# Patient Record
Sex: Female | Born: 1990 | Race: Black or African American | Hispanic: No | Marital: Single | Smoking: Never smoker
Health system: Southern US, Community
[De-identification: ages and names within clinical notes are randomized; demographics above are authoritative.]

## PROBLEM LIST (undated history)

## (undated) ENCOUNTER — Inpatient Hospital Stay (HOSPITAL_COMMUNITY): Payer: Self-pay

## (undated) DIAGNOSIS — Z789 Other specified health status: Secondary | ICD-10-CM

## (undated) DIAGNOSIS — N83209 Unspecified ovarian cyst, unspecified side: Secondary | ICD-10-CM

## (undated) DIAGNOSIS — Z973 Presence of spectacles and contact lenses: Secondary | ICD-10-CM

## (undated) HISTORY — DX: Other specified health status: Z78.9

---
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
## 2011-04-21 NOTE — L&D Delivery Note (Signed)
I was present for delivery and agree with above  

## 2011-04-21 NOTE — L&D Delivery Note (Signed)
Delivery Note At 9:50 PM a viable female was delivered via Vaginal, Spontaneous Delivery (Presentation: ; Transverse Occiput Left).  APGAR: 7, 9; weight .   Placenta status: Intact, Spontaneous.  Cord: 3 vessels with the following complications: None.    Anesthesia: Epidural  Episiotomy: None Lacerations: 2nd degree;Perineal Suture Repair: 3.0 vicryl Est. Blood Loss (mL): 350  Mom to postpartum.  Baby to nursery-stable.  BOOTH, Amen Staszak 01/14/2012, 10:24 PM

## 2011-08-18 ENCOUNTER — Ambulatory Visit: Payer: Medicaid Other | Admitting: Family Medicine

## 2011-08-24 ENCOUNTER — Ambulatory Visit (INDEPENDENT_AMBULATORY_CARE_PROVIDER_SITE_OTHER): Payer: Self-pay | Admitting: Family Medicine

## 2011-08-24 ENCOUNTER — Telehealth: Payer: Self-pay | Admitting: Family Medicine

## 2011-08-24 ENCOUNTER — Encounter: Payer: Self-pay | Admitting: Family Medicine

## 2011-08-24 VITALS — BP 112/62 | HR 92 | Temp 98.6°F | Ht 64.25 in | Wt 137.3 lb

## 2011-08-24 DIAGNOSIS — Z3400 Encounter for supervision of normal first pregnancy, unspecified trimester: Secondary | ICD-10-CM

## 2011-08-24 DIAGNOSIS — N912 Amenorrhea, unspecified: Secondary | ICD-10-CM

## 2011-08-24 DIAGNOSIS — Z3201 Encounter for pregnancy test, result positive: Secondary | ICD-10-CM

## 2011-08-24 DIAGNOSIS — Z331 Pregnant state, incidental: Secondary | ICD-10-CM

## 2011-08-24 LAB — POCT URINE PREGNANCY: Preg Test, Ur: POSITIVE

## 2011-08-24 NOTE — Progress Notes (Signed)
  Subjective:    Patient ID: Cassandra [name], female    DOB: 04-08-1991, 20 y.o.   MRN: [id number]  HPI 82 yo here with boyfriend for pregnancy test, has had positive test at home.  Boyfriend very excited about pregnancy, pt hesitant before saying this is a good thing.  Pregnancy unplanned.  Pt is a G1P0 at 16 and 3/7 by sure LMP (no horomonal contraception, regular cycles) with and EDD of 01/15/12.  She has had no prenatal care and is not taking prenatal vitamins.  She does not smoke, drink ETOH or use illegal drugs.  She takes no medication.  Pt has felt fetal movement, denies ctx, VB or LOF.      Review of Systems  See HPI     Objective:   Physical Exam  Nursing note and vitals reviewed. Constitutional: She appears well-developed and well-nourished. No distress.  Abdominal:       Gravid uterus.   FHT:  150s  Skin: She is not diaphoretic.  Psychiatric: She has a normal mood and affect. Her behavior is normal. Judgment and thought content normal.          Assessment & Plan:

## 2011-08-24 NOTE — Assessment & Plan Note (Addendum)
G1P0 at 19.3 by sure LMP with unplanned pregnancy.  Here with BF, who is excited about the pregnancy, but unclear if pt is excited.   Pt to apply for Medicaid today.  Offered prenatal labs, but would have to pay out of pocket, so she prefers to apply for medicaid. She will contact us when she has applied.    Will need early 1 hour for +FH diabetes (mother), can do with prenatal panel.   Will need ultrasound as soon as possible.   Discussed genetic screening and [facility]. Boyfriend wants genetic screening.  Pt ok with [facility] calling. I will ask them to contact the pt.   Will need prenatal panel and new OB visit.   Follow up as soon as possible. Start prenatal vitamin.

## 2011-08-24 NOTE — Patient Instructions (Signed)
Congratulations.  You are pregnant, and it looks like you are 19 weeks and 3 days along.  This would put your due date around 01/15/12.  It is normal to have lots of different feelings about a pregnancy. Please let us know as soon as you apply for Medicaid.  We will need to schedule an ultrasound and appointments and check some blood work, but you will get a huge bill for these services if you have not applied for Medicaid. Please let us know if you have any vaginal bleeding, unusual discharge, abdominal pain, or any other concerns.

## 2011-08-24 NOTE — Telephone Encounter (Signed)
Left message with [facility] that I would like to refer a patient.  Did not leave pt info as I am unsure if VM was confidential.

## 2011-08-27 ENCOUNTER — Telehealth: Payer: Self-pay | Admitting: Family Medicine

## 2011-08-27 NOTE — Telephone Encounter (Signed)
Spoke with Hannah Beat and made referral to Nurse [facility].  She will contact the pt.

## 2011-09-02 ENCOUNTER — Other Ambulatory Visit: Payer: Self-pay

## 2011-09-02 DIAGNOSIS — Z3400 Encounter for supervision of normal first pregnancy, unspecified trimester: Secondary | ICD-10-CM

## 2011-09-02 LAB — HIV ANTIBODY (ROUTINE TESTING W REFLEX): HIV: NONREACTIVE

## 2011-09-02 NOTE — Progress Notes (Signed)
PRENATAL LABS DONE TODAY PER DR.JORDAN Doran Nestle

## 2011-09-03 LAB — OBSTETRIC PANEL
Antibody Screen: NEGATIVE
Hemoglobin: 10.1 g/dL — ABNORMAL LOW (ref 12.0–15.0)
Hepatitis B Surface Ag: NEGATIVE
Lymphocytes Relative: 30 % (ref 12–46)
MCV: 90.2 fL (ref 78.0–100.0)
Neutro Abs: 3.8 10*3/uL (ref 1.7–7.7)
Platelets: 312 10*3/uL (ref 150–400)
RBC: 3.47 MIL/uL — ABNORMAL LOW (ref 3.87–5.11)
Rh Type: POSITIVE
Rubella: 59.5 IU/mL — ABNORMAL HIGH
WBC: 6.2 10*3/uL (ref 4.0–10.5)

## 2011-09-04 LAB — CULTURE, OB URINE: Colony Count: 50000

## 2011-09-17 ENCOUNTER — Other Ambulatory Visit (HOSPITAL_COMMUNITY)
Admission: RE | Admit: 2011-09-17 | Discharge: 2011-09-17 | Disposition: A | Discharge status: Home / Self Care | Payer: Medicaid Other | Source: Ambulatory Visit | Attending: Family Medicine | Admitting: Family Medicine

## 2011-09-17 ENCOUNTER — Encounter: Payer: Self-pay | Admitting: Emergency Medicine

## 2011-09-17 ENCOUNTER — Ambulatory Visit: Payer: Self-pay | Admitting: Emergency Medicine

## 2011-09-17 VITALS — BP 110/66 | Wt 138.0 lb

## 2011-09-17 DIAGNOSIS — Z3400 Encounter for supervision of normal first pregnancy, unspecified trimester: Secondary | ICD-10-CM

## 2011-09-17 DIAGNOSIS — Z331 Pregnant state, incidental: Secondary | ICD-10-CM

## 2011-09-17 DIAGNOSIS — O99019 Anemia complicating pregnancy, unspecified trimester: Secondary | ICD-10-CM | POA: Insufficient documentation

## 2011-09-17 DIAGNOSIS — Z113 Encounter for screening for infections with a predominantly sexual mode of transmission: Secondary | ICD-10-CM | POA: Insufficient documentation

## 2011-09-17 LAB — GLUCOSE, CAPILLARY
Comment 1: 1
Glucose-Capillary: 100 mg/dL — ABNORMAL HIGH (ref 70–99)

## 2011-09-17 MED ORDER — FERROUS SULFATE 325 (65 FE) MG PO TABS: 325.0000 mg | ORAL_TABLET | Freq: Every day | ORAL | Status: DC

## 2011-09-17 NOTE — Patient Instructions (Signed)
It was nice to meet you!  Please continue taking your chewable vitamin.  I have also sent a prescription for iron pills to your pharmacy.  Take one a day with breakfast.  We will schedule an ultrasound for you.  This will be reimbursed by medicaid when if comes through.  Please make an appointment in [facility] in 4 weeks.  Pregnancy - Second Trimester The second trimester of pregnancy (3 to 6 months) is a period of rapid growth for you and your baby. At the end of the sixth month, your baby is about 9 inches long and weighs 1 1/2 pounds. You will begin to feel the baby move between 18 and 20 weeks of the pregnancy. This is called quickening. Weight gain is faster. A clear fluid (colostrum) may leak out of your breasts. You may feel small contractions of the womb (uterus). This is known as false labor or Braxton-Hicks contractions. This is like a practice for labor when the baby is ready to be born. Usually, the problems with morning sickness have usually passed by the end of your first trimester. Some women develop small dark blotches (called cholasma, mask of pregnancy) on their face that usually goes away after the baby is born. Exposure to the sun makes the blotches worse. Acne may also develop in some pregnant women and pregnant women who have acne, may find that it goes away. PRENATAL EXAMS  Blood work may continue to be done during prenatal exams. These tests are done to check on your health and the probable health of your baby. Blood work is used to follow your blood levels (hemoglobin). Anemia (low hemoglobin) is common during pregnancy. Iron and vitamins are given to help prevent this. You will also be checked for diabetes between 24 and 28 weeks of the pregnancy. Some of the previous blood tests may be repeated.   The size of the uterus is measured during each visit. This is to make sure that the baby is continuing to grow properly according to the dates of the pregnancy.   Your blood  pressure is checked every prenatal visit. This is to make sure you are not getting toxemia.   Your urine is checked to make sure you do not have an infection, diabetes or protein in the urine.   Your weight is checked often to make sure gains are happening at the suggested rate. This is to ensure that both you and your baby are growing normally.   Sometimes, an ultrasound is performed to confirm the proper growth and development of the baby. This is a test which bounces harmless sound waves off the baby so your caregiver can more accurately determine due dates.  Sometimes, a specialized test is done on the amniotic fluid surrounding the baby. This test is called an amniocentesis. The amniotic fluid is obtained by sticking a needle into the belly (abdomen). This is done to check the chromosomes in instances where there is a concern about possible genetic problems with the baby. It is also sometimes done near the end of pregnancy if an early delivery is required. In this case, it is done to help make sure the baby's lungs are mature enough for the baby to live outside of the womb. CHANGES OCCURING IN THE SECOND TRIMESTER OF PREGNANCY Your body goes through many changes during pregnancy. They vary from person to person. Talk to your caregiver about changes you notice that you are concerned about.  During the second trimester, you will likely  have an increase in your appetite. It is normal to have cravings for certain foods. This varies from person to person and pregnancy to pregnancy.   Your lower abdomen will begin to bulge.   You may have to urinate more often because the uterus and baby are pressing on your bladder. It is also common to get more bladder infections during pregnancy (pain with urination). You can help this by drinking lots of fluids and emptying your bladder before and after intercourse.   You may begin to get stretch marks on your hips, abdomen, and breasts. These are normal changes  in the body during pregnancy. There are no exercises or medications to take that prevent this change.   You may begin to develop swollen and bulging veins (varicose veins) in your legs. Wearing support hose, elevating your feet for 15 minutes, 3 to 4 times a day and limiting salt in your diet helps lessen the problem.   Heartburn may develop as the uterus grows and pushes up against the stomach. Antacids recommended by your caregiver helps with this problem. Also, eating smaller meals 4 to 5 times a day helps.   Constipation can be treated with a stool softener or adding bulk to your diet. Drinking lots of fluids, vegetables, fruits, and whole grains are helpful.   Exercising is also helpful. If you have been very active up until your pregnancy, most of these activities can be continued during your pregnancy. If you have been less active, it is helpful to start an exercise program such as walking.   Hemorrhoids (varicose veins in the rectum) may develop at the end of the second trimester. Warm sitz baths and hemorrhoid cream recommended by your caregiver helps hemorrhoid problems.   Backaches may develop during this time of your pregnancy. Avoid heavy lifting, wear low heal shoes and practice good posture to help with backache problems.   Some pregnant women develop tingling and numbness of their hand and fingers because of swelling and tightening of ligaments in the wrist (carpel tunnel syndrome). This goes away after the baby is born.   As your breasts enlarge, you may have to get a bigger bra. Get a comfortable, cotton, support bra. Do not get a nursing bra until the last month of the pregnancy if you will be nursing the baby.   You may get a dark line from your belly button to the pubic area called the linea nigra.   You may develop rosy cheeks because of increase blood flow to the face.   You may develop spider looking lines of the face, neck, arms and chest. These go away after the baby  is born.  HOME CARE INSTRUCTIONS   It is extremely important to avoid all smoking, herbs, alcohol, and unprescribed drugs during your pregnancy. These chemicals affect the formation and growth of the baby. Avoid these chemicals throughout the pregnancy to ensure the delivery of a healthy infant.   Most of your home care instructions are the same as suggested for the first trimester of your pregnancy. Keep your caregiver's appointments. Follow your caregiver's instructions regarding medication use, exercise and diet.   During pregnancy, you are providing food for you and your baby. Continue to eat regular, well-balanced meals. Choose foods such as meat, fish, milk and other low fat dairy products, vegetables, fruits, and whole-grain breads and cereals. Your caregiver will tell you of the ideal weight gain.   A physical sexual relationship may be continued up until near the  end of pregnancy if there are no other problems. Problems could include early (premature) leaking of amniotic fluid from the membranes, vaginal bleeding, abdominal pain, or other medical or pregnancy problems.   Exercise regularly if there are no restrictions. Check with your caregiver if you are unsure of the safety of some of your exercises. The greatest weight gain will occur in the last 2 trimesters of pregnancy. Exercise will help you:   Control your weight.   Get you in shape for labor and delivery.   Lose weight after you have the baby.   Wear a good support or jogging bra for breast tenderness during pregnancy. This may help if worn during sleep. Pads or tissues may be used in the bra if you are leaking colostrum.   Do not use hot tubs, steam rooms or saunas throughout the pregnancy.   Wear your seat belt at all times when driving. This protects you and your baby if you are in an accident.   Avoid raw meat, uncooked cheese, cat litter boxes and soil used by cats. These carry germs that can cause birth defects in the  baby.   The second trimester is also a good time to visit your dentist for your dental health if this has not been done yet. Getting your teeth cleaned is OK. Use a soft toothbrush. Brush gently during pregnancy.   It is easier to loose urine during pregnancy. Tightening up and strengthening the pelvic muscles will help with this problem. Practice stopping your urination while you are going to the bathroom. These are the same muscles you need to strengthen. It is also the muscles you would use as if you were trying to stop from passing gas. You can practice tightening these muscles up 10 times a set and repeating this about 3 times per day. Once you know what muscles to tighten up, do not perform these exercises during urination. It is more likely to contribute to an infection by backing up the urine.   Ask for help if you have financial, counseling or nutritional needs during pregnancy. Your caregiver will be able to offer counseling for these needs as well as refer you for other special needs.   Your skin may become oily. If so, wash your face with mild soap, use non-greasy moisturizer and oil or cream based makeup.  MEDICATIONS AND DRUG USE IN PREGNANCY  Take prenatal vitamins as directed. The vitamin should contain 1 milligram of folic acid. Keep all vitamins out of reach of children. Only a couple vitamins or tablets containing iron may be fatal to a baby or young child when ingested.   Avoid use of all medications, including herbs, over-the-counter medications, not prescribed or suggested by your caregiver. Only take over-the-counter or prescription medicines for pain, discomfort, or fever as directed by your caregiver. Do not use aspirin.   Let your caregiver also know about herbs you may be using.   Alcohol is related to a number of birth defects. This includes fetal alcohol syndrome. All alcohol, in any form, should be avoided completely. Smoking will cause low birth rate and premature  babies.   Street or illegal drugs are very harmful to the baby. They are absolutely forbidden. A baby born to an addicted mother will be addicted at birth. The baby will go through the same withdrawal an adult does.  SEEK MEDICAL CARE IF:  You have any concerns or worries during your pregnancy. It is better to call with your questions if  you feel they cannot wait, rather than worry about them. SEEK IMMEDIATE MEDICAL CARE IF:   An unexplained oral temperature above 102 F (38.9 C) develops, or as your caregiver suggests.   You have leaking of fluid from the vagina (birth canal). If leaking membranes are suspected, take your temperature and tell your caregiver of this when you call.   There is vaginal spotting, bleeding, or passing clots. Tell your caregiver of the amount and how many pads are used. Light spotting in pregnancy is common, especially following intercourse.   You develop a bad smelling vaginal discharge with a change in the color from clear to white.   You continue to feel sick to your stomach (nauseated) and have no relief from remedies suggested. You vomit blood or coffee ground-like materials.   You lose more than 2 pounds of weight or gain more than 2 pounds of weight over 1 week, or as suggested by your caregiver.   You notice swelling of your face, hands, feet, or legs.   You get exposed to [place] measles and have never had them.   You are exposed to fifth disease or chickenpox.   You develop belly (abdominal) pain. Round ligament discomfort is a common non-cancerous (benign) cause of abdominal pain in pregnancy. Your caregiver still must evaluate you.   You develop a bad headache that does not go away.   You develop fever, diarrhea, pain with urination, or shortness of breath.   You develop visual problems, blurry, or double vision.   You fall or are in a car accident or any kind of trauma.   There is mental or physical violence at home.  Document Released:  03/31/2001 Document Revised: 03/26/2011 Document Reviewed: 10/03/2008 [facility] Patient Information 2012 [place], [place].

## 2011-09-17 NOTE — Progress Notes (Signed)
S: 21 year old G1 at [redacted]w[redacted]d by sure LMP (EDD 01/15/2012) presenting for initial OB visit.  No acute concerns.  Did have some nausea and vomiting earlier in pregnancy, but none currently.  Reviewed medical, family, social, OB history with patient.  Does have 1st degree relative with DM so will need early glucola.  Has not had any ultrasound yet.  Waiting for Medicaid to come through. Taking [place] chewable vitamins with folic acid as she cannot take the prenatal pills.  Has good support at home.  Living with mom; working at [employer].  Father of baby involved.  + fetal movement.  No vaginal bleeding or discharge.  No contractions.  PMH: completed, no red flags PHQ-9: score 0  Pediatrician: considering [facility] Feeding: wants to pump Birth control: undecided Circ: yes if boy  O: see flowsheet Gen: alert, NAD, cooperative HEENT: PERRL, EOMI, MMM, no pharyngeal erythema or exudate; sclera white Neck: no LAD, thyroid normal CV: RRR, no murmurs Pulm: CTAB, no wheezes or rales Abd: gravid Ext: no edema, 2+ peripheral pulses Neuro: PERRL, gait normal Skin: normal  A/P: 21 yo G1 at [redacted]w[redacted]d - continue flintstones vitamin - will start iron pill for anemia once a day - early 1hr GTT today - anatomy scan ordered today - discussed appropriate weight gain of 20-30lbs  - discussed eating a varied diet with lots of veggies - reviewed bleeding precautions - follow up in OB clinic in 4 weeks

## 2011-09-19 ENCOUNTER — Telehealth: Payer: Self-pay | Admitting: Emergency Medicine

## 2011-09-19 MED ORDER — AZITHROMYCIN 500 MG PO TABS: 1000.0000 mg | ORAL_TABLET | Freq: Once | ORAL | Status: DC

## 2011-09-19 NOTE — Telephone Encounter (Signed)
Attempted to call patient to discuss results of STD screening.  Unable to reach the patient, and not able to leave a VM as multiple people appear to use this phone number.  Will send prescription to her pharmacy, and attempt to call again tomorrow.

## 2011-09-20 ENCOUNTER — Telehealth: Payer: Self-pay | Admitting: Emergency Medicine

## 2011-09-20 NOTE — Telephone Encounter (Signed)
Informed patient of positive chlamydia result.  Prescription at pharmacy, encouraged her to get it today.  Also discussed having her partner get tested and treated to prevent re-infection.

## 2011-09-22 NOTE — Telephone Encounter (Addendum)
Patient called to say Rx was not at pharmacy. I called pharmacy and it is on file.  The total cost for 6 tabs was $96.00.  The directions were two tabs once of  Azithromycin 500 mg . Consulted with Dr. [place] and she advises patient will only need the two tabs. Called pharmacy to let them know to only give two tabs. Called patient to advise cost is $36.00.  She is agreeable to this.

## 2011-09-23 ENCOUNTER — Ambulatory Visit (HOSPITAL_COMMUNITY)
Admission: RE | Admit: 2011-09-23 | Discharge: 2011-09-23 | Disposition: A | Discharge status: Home / Self Care | Payer: Medicaid Other | Source: Ambulatory Visit | Attending: Family Medicine | Admitting: Family Medicine

## 2011-09-23 DIAGNOSIS — Z1389 Encounter for screening for other disorder: Secondary | ICD-10-CM | POA: Insufficient documentation

## 2011-09-23 DIAGNOSIS — Z363 Encounter for antenatal screening for malformations: Secondary | ICD-10-CM | POA: Insufficient documentation

## 2011-09-23 DIAGNOSIS — Z3400 Encounter for supervision of normal first pregnancy, unspecified trimester: Secondary | ICD-10-CM

## 2011-09-23 DIAGNOSIS — O358XX0 Maternal care for other (suspected) fetal abnormality and damage, not applicable or unspecified: Secondary | ICD-10-CM | POA: Insufficient documentation

## 2011-09-24 ENCOUNTER — Telehealth: Payer: Self-pay | Admitting: *Deleted

## 2011-09-24 NOTE — Telephone Encounter (Signed)
Called pt in re: meds for positive chlamydia. Pt said, that she will pick up her medications from the pharmacy today. Lorenda Hatchet, Leandre Wien Report faxed to the H.D. .Arlyss Repress

## 2011-11-03 ENCOUNTER — Other Ambulatory Visit (HOSPITAL_COMMUNITY)
Admission: RE | Admit: 2011-11-03 | Discharge: 2011-11-03 | Disposition: A | Discharge status: Home / Self Care | Payer: Medicaid Other | Source: Ambulatory Visit | Attending: Family Medicine | Admitting: Family Medicine

## 2011-11-03 ENCOUNTER — Encounter: Payer: Self-pay | Admitting: Family Medicine

## 2011-11-03 ENCOUNTER — Ambulatory Visit (INDEPENDENT_AMBULATORY_CARE_PROVIDER_SITE_OTHER): Payer: Medicaid Other | Admitting: Family Medicine

## 2011-11-03 VITALS — BP 102/68 | Wt 146.0 lb

## 2011-11-03 DIAGNOSIS — Z113 Encounter for screening for infections with a predominantly sexual mode of transmission: Secondary | ICD-10-CM | POA: Insufficient documentation

## 2011-11-03 DIAGNOSIS — Z3400 Encounter for supervision of normal first pregnancy, unspecified trimester: Secondary | ICD-10-CM

## 2011-11-03 LAB — CBC
HCT: 31.6 % — ABNORMAL LOW (ref 36.0–46.0)
MCHC: 32 g/dL (ref 30.0–36.0)
Platelets: 243 10*3/uL (ref 150–400)
RDW: 16.3 % — ABNORMAL HIGH (ref 11.5–15.5)

## 2011-11-03 LAB — POCT WET PREP (WET MOUNT)

## 2011-11-03 LAB — GLUCOSE, CAPILLARY: Glucose-Capillary: 88 mg/dL (ref 70–99)

## 2011-11-03 NOTE — Patient Instructions (Addendum)
Start taking iron daily again You can eat extra fiber or take miralax for regular bowel movements. Make an appointment with OB clinic in 2 weeks. If you have contraction, bleeding, pain or other concerning symptoms go to [facility] hospital.   Pregnancy - Third Trimester The third trimester of pregnancy (the last 3 months) is a period of the most rapid growth for you and your baby. The baby approaches a length of 20 inches and a weight of 6 to 10 pounds. The baby is adding on fat and getting ready for life outside your body. While inside, babies have periods of sleeping and waking, suck their thumbs, and hiccups. You can often feel small contractions of the uterus. This is false labor. It is also called Braxton-Hicks contractions. This is like a practice for labor. The usual problems in this stage of pregnancy include more difficulty breathing, swelling of the hands and feet from water retention, and having to urinate more often because of the uterus and baby pressing on your bladder.  PRENATAL EXAMS  Blood work may continue to be done during prenatal exams. These tests are done to check on your health and the probable health of your baby. Blood work is used to follow your blood levels (hemoglobin). Anemia (low hemoglobin) is common during pregnancy. Iron and vitamins are given to help prevent this. You may also continue to be checked for diabetes. Some of the past blood tests may be done again.   The size of the uterus is measured during each visit. This makes sure your baby is growing properly according to your pregnancy dates.   Your blood pressure is checked every prenatal visit. This is to make sure you are not getting toxemia.   Your urine is checked every prenatal visit for infection, diabetes and protein.   Your weight is checked at each visit. This is done to make sure gains are happening at the suggested rate and that you and your baby are growing normally.   Sometimes, an ultrasound is  performed to confirm the position and the proper growth and development of the baby. This is a test done that bounces harmless sound waves off the baby so your caregiver can more accurately determine due dates.   Discuss the type of pain medication and anesthesia you will have during your labor and delivery.   Discuss the possibility and anesthesia if a Cesarean Section might be necessary.   Inform your caregiver if there is any mental or physical violence at home.  Sometimes, a specialized non-stress test, contraction stress test and biophysical profile are done to make sure the baby is not having a problem. Checking the amniotic fluid surrounding the baby is called an amniocentesis. The amniotic fluid is removed by sticking a needle into the belly (abdomen). This is sometimes done near the end of pregnancy if an early delivery is required. In this case, it is done to help make sure the baby's lungs are mature enough for the baby to live outside of the womb. If the lungs are not mature and it is unsafe to deliver the baby, an injection of cortisone medication is given to the mother 1 to 2 days before the delivery. This helps the baby's lungs mature and makes it safer to deliver the baby. CHANGES OCCURING IN THE THIRD TRIMESTER OF PREGNANCY Your body goes through many changes during pregnancy. They vary from person to person. Talk to your caregiver about changes you notice and are concerned about.  During the  last trimester, you have probably had an increase in your appetite. It is normal to have cravings for certain foods. This varies from person to person and pregnancy to pregnancy.   You may begin to get stretch marks on your hips, abdomen, and breasts. These are normal changes in the body during pregnancy. There are no exercises or medications to take which prevent this change.   Constipation may be treated with a stool softener or adding bulk to your diet. Drinking lots of fluids, fiber in  vegetables, fruits, and whole grains are helpful.   Exercising is also helpful. If you have been very active up until your pregnancy, most of these activities can be continued during your pregnancy. If you have been less active, it is helpful to start an exercise program such as walking. Consult your caregiver before starting exercise programs.   Avoid all smoking, alcohol, un-prescribed drugs, herbs and "street drugs" during your pregnancy. These chemicals affect the formation and growth of the baby. Avoid chemicals throughout the pregnancy to ensure the delivery of a healthy infant.   Backache, varicose veins and hemorrhoids may develop or get worse.   You will tire more easily in the third trimester, which is normal.   The baby's movements may be stronger and more often.   You may become short of breath easily.   Your belly button may stick out.   A yellow discharge may leak from your breasts called colostrum.   You may have a bloody mucus discharge. This usually occurs a few days to a week before labor begins.  HOME CARE INSTRUCTIONS   Keep your caregiver's appointments. Follow your caregiver's instructions regarding medication use, exercise, and diet.   During pregnancy, you are providing food for you and your baby. Continue to eat regular, well-balanced meals. Choose foods such as meat, fish, milk and other low fat dairy products, vegetables, fruits, and whole-grain breads and cereals. Your caregiver will tell you of the ideal weight gain.   A physical sexual relationship may be continued throughout pregnancy if there are no other problems such as early (premature) leaking of amniotic fluid from the membranes, vaginal bleeding, or belly (abdominal) pain.   Exercise regularly if there are no restrictions. Check with your caregiver if you are unsure of the safety of your exercises. Greater weight gain will occur in the last 2 trimesters of pregnancy. Exercising helps:   Control your  weight.   Get you in shape for labor and delivery.   You lose weight after you deliver.   Rest a lot with legs elevated, or as needed for leg cramps or low back pain.   Wear a good support or jogging bra for breast tenderness during pregnancy. This may help if worn during sleep. Pads or tissues may be used in the bra if you are leaking colostrum.   Do not use hot tubs, steam rooms, or saunas.   Wear your seat belt when driving. This protects you and your baby if you are in an accident.   Avoid raw meat, cat litter boxes and soil used by cats. These carry germs that can cause birth defects in the baby.   It is easier to loose urine during pregnancy. Tightening up and strengthening the pelvic muscles will help with this problem. You can practice stopping your urination while you are going to the bathroom. These are the same muscles you need to strengthen. It is also the muscles you would use if you were trying to  stop from passing gas. You can practice tightening these muscles up 10 times a set and repeating this about 3 times per day. Once you know what muscles to tighten up, do not perform these exercises during urination. It is more likely to cause an infection by backing up the urine.   Ask for help if you have financial, counseling or nutritional needs during pregnancy. Your caregiver will be able to offer counseling for these needs as well as refer you for other special needs.   Make a list of emergency phone numbers and have them available.   Plan on getting help from family or friends when you go home from the hospital.   Make a trial run to the hospital.   Take prenatal classes with the father to understand, practice and ask questions about the labor and delivery.   Prepare the baby's room/nursery.   Do not travel out of the city unless it is absolutely necessary and with the advice of your caregiver.   Wear only low or no heal shoes to have better balance and prevent falling.    MEDICATIONS AND DRUG USE IN PREGNANCY  Take prenatal vitamins as directed. The vitamin should contain 1 milligram of folic acid. Keep all vitamins out of reach of children. Only a couple vitamins or tablets containing iron may be fatal to a baby or young child when ingested.   Avoid use of all medications, including herbs, over-the-counter medications, not prescribed or suggested by your caregiver. Only take over-the-counter or prescription medicines for pain, discomfort, or fever as directed by your caregiver. Do not use aspirin, ibuprofen (Motrin, Advil, Nuprin) or naproxen (Aleve) unless OK'd by your caregiver.   Let your caregiver also know about herbs you may be using.   Alcohol is related to a number of birth defects. This includes fetal alcohol syndrome. All alcohol, in any form, should be avoided completely. Smoking will cause low birth rate and premature babies.   Street/illegal drugs are very harmful to the baby. They are absolutely forbidden. A baby born to an addicted mother will be addicted at birth. The baby will go through the same withdrawal an adult does.  SEEK MEDICAL CARE IF: You have any concerns or worries during your pregnancy. It is better to call with your questions if you feel they cannot wait, rather than worry about them. DECISIONS ABOUT CIRCUMCISION You may or may not know the sex of your baby. If you know your baby is a boy, it may be time to think about circumcision. Circumcision is the removal of the foreskin of the penis. This is the skin that covers the sensitive end of the penis. There is no proven medical need for this. Often this decision is made on what is popular at the time or based upon religious beliefs and social issues. You can discuss these issues with your caregiver or pediatrician. SEEK IMMEDIATE MEDICAL CARE IF:   An unexplained oral temperature above 102 F (38.9 C) develops, or as your caregiver suggests.   You have leaking of fluid from the  vagina (birth canal). If leaking membranes are suspected, take your temperature and tell your caregiver of this when you call.   There is vaginal spotting, bleeding or passing clots. Tell your caregiver of the amount and how many pads are used.   You develop a bad smelling vaginal discharge with a change in the color from clear to white.   You develop vomiting that lasts more than 24 hours.  You develop chills or fever.   You develop shortness of breath.   You develop burning on urination.   You loose more than 2 pounds of weight or gain more than 2 pounds of weight or as suggested by your caregiver.   You notice sudden swelling of your face, hands, and feet or legs.   You develop belly (abdominal) pain. Round ligament discomfort is a common non-cancerous (benign) cause of abdominal pain in pregnancy. Your caregiver still must evaluate you.   You develop a severe headache that does not go away.   You develop visual problems, blurred or double vision.   If you have not felt your baby move for more than 1 hour. If you think the baby is not moving as much as usual, eat something with sugar in it and lie down on your left side for an hour. The baby should move at least 4 to 5 times per hour. Call right away if your baby moves less than that.   You fall, are in a car accident or any kind of trauma.   There is mental or physical violence at home.  Document Released: 03/31/2001 Document Revised: 03/26/2011 Document Reviewed: 10/03/2008 [facility] Patient Information 2012 [place], [place].

## 2011-11-03 NOTE — Progress Notes (Signed)
21 yo G1 at 31.0 weeks by late [place] (dates 10 days difference from LMP). No complaints. Not taking iron due to constipation. Good FM. Wt gain 8 lbs. No bleeding, ctx. FOB has not been treated for chlamydia, they have been intimate again with a condom after she was treated with azithromycin,  she is asymptomatic. Feels safe at home and good relationship status with FOB. Mother is supportive.  PE: NAD, no edema, pelvic: Thick white discharge. Cervix closed, thick. No adnexal tenderness.  A/P 21 yo G1 at 31.0 wk by late [place]. TOC for chlamydia today, wet prep. Advised partner get treated again (planning on it). Reinforced iron replacement. 1hr GTT today. Labs today-CBC, HIV, RPR. PHQ-9 score is 0. Follow up in 2 weeks.

## 2011-11-04 LAB — RPR

## 2011-11-16 ENCOUNTER — Encounter: Payer: Medicaid Other | Admitting: Family Medicine

## 2011-11-23 ENCOUNTER — Ambulatory Visit (INDEPENDENT_AMBULATORY_CARE_PROVIDER_SITE_OTHER): Payer: Medicaid Other | Admitting: Family Medicine

## 2011-11-23 VITALS — BP 102/64 | Temp 98.4°F | Wt 147.0 lb

## 2011-11-23 DIAGNOSIS — Z3400 Encounter for supervision of normal first pregnancy, unspecified trimester: Secondary | ICD-10-CM

## 2011-11-23 DIAGNOSIS — R1110 Vomiting, unspecified: Secondary | ICD-10-CM | POA: Insufficient documentation

## 2011-11-23 LAB — POCT UA - MICROSCOPIC ONLY

## 2011-11-23 LAB — POCT URINALYSIS DIPSTICK
Nitrite, UA: NEGATIVE
Protein, UA: NEGATIVE
Spec Grav, UA: 1.02
Urobilinogen, UA: 1
pH, UA: 8

## 2011-11-23 MED ORDER — ONDANSETRON HCL 4 MG PO TABS: 4.0000 mg | ORAL_TABLET | Freq: Three times a day (TID) | ORAL | Status: AC | PRN

## 2011-11-23 NOTE — Patient Instructions (Signed)
If you have any fevers, pain, contractions, bleeding, urinary symptoms then go to the [facility] at [facility] hospital. Try zofran for nausea. Make sure to drink plenty of fluids. Make appointment for follow up in 2 days. Make appointment with OB clinic in 2 weeks.  Nausea and Vomiting Nausea means you feel sick to your stomach. Throwing up (vomiting) is a reflex where stomach contents come out of your mouth. HOME CARE   Take medicine as told by your doctor.   Do not force yourself to eat. However, you do need to drink fluids.   If you feel like eating, eat a normal diet as told by your doctor.   Eat rice, wheat, potatoes, bread, lean meats, yogurt, fruits, and vegetables.   Avoid high-fat foods.   Drink enough fluids to keep your pee (urine) clear or pale yellow.   Ask your doctor how to replace body fluid losses (rehydrate). Signs of body fluid loss (dehydration) include:   Feeling very thirsty.   Dry lips and mouth.   Feeling dizzy.   Dark pee.   Peeing less than normal.   Feeling confused.   Fast breathing or heart rate.  GET HELP RIGHT AWAY IF:   You have blood in your throw up.   You have black or bloody poop (stool).   You have a bad headache or stiff neck.   You feel confused.   You have bad belly (abdominal) pain.   You have chest pain or trouble breathing.   You do not pee at least once every 8 hours.   You have cold, clammy skin.   You keep throwing up after 24 to 48 hours.   You have a fever.  MAKE SURE YOU:   Understand these instructions.   Will watch your condition.   Will get help right away if you are not doing well or get worse.  Document Released: 09/23/2007 Document Revised: 03/26/2011 Document Reviewed: 09/05/2010 [facility] Patient Information 2012 [place], [place].

## 2011-11-23 NOTE — Progress Notes (Signed)
33.6 week ROB visit. Pt c/o emesis x 2 days. She is keeping down plenty of fluids, ginger ale. Vomits solid foods, tried eating [employer] 2 days ago and this returned. Some abd cramping. No diarrhea, fevers, abdominal pain, dyspnea, ctx, vag discharge or bleeding, weight loss. Feels constipated, last BM hard stool yesterday. Only med is PNV.  PE: nad, appears well. Abd: benign, nontender, no CVA tenderness. No edema, respirations nonlabored. No vaginal discharge or cervical motion tenderness. Cervix: fingertip, thick, -3.  A/P: 21 yo G1 at 33.6 weeks with emesis. Keeping down plenty of fluids currently. No pain or fever or other red flags. UA not conclusive, will send for culture, rx zofran prn, encourage extra PO fluid. Advised prune juice for constipation or miralax. No sign of PTL. Recommend f/u in 2 days for emesis, f/u in 2 weeks in OB clinic for GBS swab. Discussed PTL precautions to seek emergency care.

## 2011-11-26 ENCOUNTER — Telehealth: Payer: Self-pay | Admitting: Family Medicine

## 2011-11-26 NOTE — Telephone Encounter (Signed)
Discussed with Dr. Leveda Anna and spoke with patient regarding urine culture results. Not a typical pathogen. She is asymptomatic, no longer vomiting. Will not give an antibiotic for this.  Patient to f/u in one week in OB clinic. Call for problems.

## 2011-12-25 ENCOUNTER — Ambulatory Visit (INDEPENDENT_AMBULATORY_CARE_PROVIDER_SITE_OTHER): Payer: Medicaid Other | Admitting: Family Medicine

## 2011-12-25 ENCOUNTER — Other Ambulatory Visit (HOSPITAL_COMMUNITY)
Admission: RE | Admit: 2011-12-25 | Discharge: 2011-12-25 | Disposition: A | Discharge status: Home / Self Care | Payer: Medicaid Other | Source: Ambulatory Visit | Attending: Family Medicine | Admitting: Family Medicine

## 2011-12-25 VITALS — BP 118/66 | Temp 98.1°F | Wt 159.0 lb

## 2011-12-25 DIAGNOSIS — Z113 Encounter for screening for infections with a predominantly sexual mode of transmission: Secondary | ICD-10-CM | POA: Insufficient documentation

## 2011-12-25 DIAGNOSIS — Z3400 Encounter for supervision of normal first pregnancy, unspecified trimester: Secondary | ICD-10-CM

## 2011-12-25 LAB — POCT WET PREP (WET MOUNT)

## 2011-12-25 LAB — OB RESULTS CONSOLE GC/CHLAMYDIA: Gonorrhea: NEGATIVE

## 2011-12-25 NOTE — Patient Instructions (Addendum)
If you have signs of labor, [facility] Make sure to drink plenty of fluids. Come back to clinic, make appointment in one week. I will call if labs show any infection. Think about breastfeeding and birth control options after delivery.  Normal Labor and Delivery Your caregiver must first be sure you are in labor. Signs of labor include:  You may pass what is called "the mucus plug" before labor begins. This is a small amount of blood stained mucus.   Regular uterine contractions.   The time between contractions get closer together.   The discomfort and pain gradually gets more intense.   Pains are mostly located in the back.   Pains get worse when walking.   The cervix (the opening of the uterus becomes thinner (begins to efface) and opens up (dilates).  Once you are in labor and admitted into the hospital or care center, your caregiver will do the following:  A complete physical examination.   Check your vital signs (blood pressure, pulse, temperature and the fetal heart rate).   Do a vaginal examination (using a sterile glove and lubricant) to determine:   The position (presentation) of the baby (head [vertex] or buttock first).   The level (station) of the baby's head in the birth canal.   The effacement and dilatation of the cervix.   You may have your pubic hair shaved and be given an enema depending on your caregiver and the circumstance.   An electronic monitor is usually placed on your abdomen. The monitor follows the length and intensity of the contractions, as well as the baby's heart rate.   Usually, your caregiver will insert an IV in your arm with a bottle of sugar water. This is done as a precaution so that medications can be given to you quickly during labor or delivery.  NORMAL LABOR AND DELIVERY IS DIVIDED UP INTO 3 STAGES: First Stage This is when regular contractions begin and the cervix begins to efface and dilate. This stage can last from 3  to 15 hours. The end of the first stage is when the cervix is 100% effaced and 10 centimeters dilated. Pain medications may be given by   Injection (morphine, demerol, etc.)   Regional anesthesia (spinal, caudal or epidural, anesthetics given in different locations of the spine). Paracervical pain medication may be given, which is an injection of and anesthetic on each side of the cervix.  A pregnant woman may request to have "Natural Childbirth" which is not to have any medications or anesthesia during her labor and delivery. Second Stage This is when the baby comes down through the birth canal (vagina) and is born. This can take 1 to 4 hours. As the baby's head comes down through the birth canal, you may feel like you are going to have a bowel movement. You will get the urge to bear down and push until the baby is delivered. As the baby's head is being delivered, the caregiver will decide if an episiotomy (a cut in the perineum and vagina area) is needed to prevent tearing of the tissue in this area. The episiotomy is sewn up after the delivery of the baby and placenta. Sometimes a mask with nitrous oxide is given for the mother to breath during the delivery of the baby to help if there is too much pain. The end of Stage 2 is when the baby is fully delivered. Then when the umbilical cord stops pulsating it is clamped and cut.  Third Stage The third stage begins after the baby is completely delivered and ends after the placenta (afterbirth) is delivered. This usually takes 5 to 30 minutes. After the placenta is delivered, a medication is given either by intravenous or injection to help contract the uterus and prevent bleeding. The third stage is not painful and pain medication is usually not necessary. If an episiotomy was done, it is repaired at this time. After the delivery, the mother is watched and monitored closely for 1 to 2 hours to make sure there is no postpartum bleeding (hemorrhage). If there is  a lot of bleeding, medication is given to contract the uterus and stop the bleeding. Document Released: 01/14/2008 Document Revised: 03/26/2011 Document Reviewed: 01/14/2008 [facility] Patient Information 2012 [place], [place].

## 2011-12-25 NOTE — Progress Notes (Signed)
21 yo G1 at 38.3 ROB visit. Accompanied by the mother of FOB Arletha Pili). She did not follow up on q2 week basis as instructed. Reports she is doing well, taking iron and vitamins. No emesis. Some irritability of uterus. Denies dysuria, emesis, fever, chills, malaise.   PE: NAD, rrr, CTAB, no abdominal TTP. Cervix appears close, thick white discharge, no motion tenderness, mucosa wnl.   A/P: G1 at 38.3 weeks, no active issues. GBS, GC/CH, Wet prep today. On iron for mild anemia. Confirmed vertex on [place]. Patient plans to breastfeed. Considering implanon. Plan to f/u with OB clinic in one week.

## 2011-12-27 LAB — STREP B DNA PROBE: GBSP: NEGATIVE

## 2011-12-28 ENCOUNTER — Telehealth: Payer: Self-pay | Admitting: *Deleted

## 2011-12-28 ENCOUNTER — Other Ambulatory Visit: Payer: Self-pay | Admitting: Family Medicine

## 2011-12-28 DIAGNOSIS — N760 Acute vaginitis: Secondary | ICD-10-CM | POA: Insufficient documentation

## 2011-12-28 DIAGNOSIS — B9689 Other specified bacterial agents as the cause of diseases classified elsewhere: Secondary | ICD-10-CM | POA: Insufficient documentation

## 2011-12-28 MED ORDER — METRONIDAZOLE 500 MG PO TABS: 500.0000 mg | ORAL_TABLET | Freq: Two times a day (BID) | ORAL | Status: DC

## 2011-12-28 MED ORDER — FLUCONAZOLE 150 MG PO TABS: 150.0000 mg | ORAL_TABLET | Freq: Once | ORAL | Status: AC

## 2011-12-28 NOTE — Telephone Encounter (Signed)
Called pt lvm to call back.Loralee Pacas [place]

## 2011-12-28 NOTE — Telephone Encounter (Signed)
Ms. Bigler returned your call and wanted you to call her at earliest convenience.

## 2011-12-28 NOTE — Progress Notes (Signed)
Called and lvm for pt to call back to inform pt that she has BV and a yeast inf. And a script has been called into her pharm.Loralee Pacas [place]

## 2011-12-28 NOTE — Telephone Encounter (Signed)
Called and lvm for pt to call back to inform her that she has BV and yeast and that an Rx has been sent to her pharm.Cassandra [name] [place]

## 2011-12-31 ENCOUNTER — Ambulatory Visit (INDEPENDENT_AMBULATORY_CARE_PROVIDER_SITE_OTHER): Payer: Medicaid Other | Admitting: Family Medicine

## 2011-12-31 VITALS — BP 127/69 | Temp 98.9°F | Wt 158.1 lb

## 2011-12-31 DIAGNOSIS — Z23 Encounter for immunization: Secondary | ICD-10-CM

## 2011-12-31 NOTE — Patient Instructions (Addendum)
It was nice to see you today.  Everything looks great. Please go to [facility] if you have contractions every 5 minutes for 1-2 hours, if your water breaks, if your baby is not moving well (at least 10 times in 2 hours) or if you have vaginal bleeding. Please make an appt in 1 week.  Let us know if you have any concerns. We will give you a flu shot today.  Please encourage anyone who will be around the baby to get these vaccines as well.

## 2012-01-02 NOTE — Progress Notes (Unsigned)
21 yo G1 here for routine visit.  Reports some some pain in her buttocks, and some pain in her right side when she rolls over at night.  Denies discharge.  No other complaints.  See flow sheet for details. A/P: Pregnancy - doing well.  Term.  Labor precautions and kick counts reviewed. GBS negative. Flu shot today.  Tdap not available, but pt needs ASAP. Postpartum contraception- Nexplanon.  Pt counseled re: likelihood of irreg menses with this method. Baby doctor: unsure.  Pt advised to decide soon. H/o chlamydia positive.  TOC neg on 11/03/11.  Third trimester screen negative 12/25/11.

## 2012-01-07 ENCOUNTER — Ambulatory Visit: Payer: Medicaid Other | Admitting: Emergency Medicine

## 2012-01-07 VITALS — BP 122/76 | Wt 161.2 lb

## 2012-01-07 DIAGNOSIS — Z3400 Encounter for supervision of normal first pregnancy, unspecified trimester: Secondary | ICD-10-CM

## 2012-01-07 NOTE — Progress Notes (Signed)
S: 21 yo G1 at [redacted]w[redacted]d.  No complaints.  Irregular mild contractions.  Good fetal movement.  No vaginal bleeding or discharge.  O: see flowsheet Bloody show on digital exam  A/P: 21 yo G1 at [redacted]w[redacted]d - continue prenatal  - GBS negative - post dates NST scheduled - discussed scheduling induction on Monday if no baby - discussed walking/sex/spicy food as ways to encourage labor - follow up on Monday if no baby

## 2012-01-07 NOTE — Patient Instructions (Addendum)
If you have regular contractions that are 3-5 minutes apart for at least one hour, bleeding or fluid from your vagina, or you do not feel the baby moving enough, please go to the [facility].  Hopefully baby will decide to be born soon.  If you are still pregnant on Monday we will schedule an induction.  Follow up on Monday.

## 2012-01-08 ENCOUNTER — Ambulatory Visit (INDEPENDENT_AMBULATORY_CARE_PROVIDER_SITE_OTHER): Payer: Medicaid Other | Admitting: *Deleted

## 2012-01-08 ENCOUNTER — Telehealth: Payer: Self-pay | Admitting: Emergency Medicine

## 2012-01-08 VITALS — BP 110/66 | Wt 160.0 lb

## 2012-01-08 DIAGNOSIS — Z3400 Encounter for supervision of normal first pregnancy, unspecified trimester: Secondary | ICD-10-CM

## 2012-01-08 DIAGNOSIS — O480 Post-term pregnancy: Secondary | ICD-10-CM

## 2012-01-08 NOTE — Telephone Encounter (Signed)
Pt has appt with dr.walden on Monday. Cassandra [name]

## 2012-01-08 NOTE — Progress Notes (Signed)
P = 86  Pt instructed to call [facility] for follow up visit on Monday as recommended by Dr. Elwyn Reach

## 2012-01-08 NOTE — Telephone Encounter (Signed)
Pt asking to either have an induction scheduled or needs a wi appt with MD, is past her due date, went for her stress test today.

## 2012-01-08 NOTE — Progress Notes (Signed)
NST performed today was reviewed and was found to be reactive.  AFI 12.7 cm. Continue recommended antenatal testing and prenatal care.

## 2012-01-11 ENCOUNTER — Ambulatory Visit (INDEPENDENT_AMBULATORY_CARE_PROVIDER_SITE_OTHER): Payer: Medicaid Other | Admitting: Family Medicine

## 2012-01-11 VITALS — BP 120/70 | Temp 98.8°F | Wt 158.8 lb

## 2012-01-11 DIAGNOSIS — Z23 Encounter for immunization: Secondary | ICD-10-CM

## 2012-01-11 DIAGNOSIS — Z3400 Encounter for supervision of normal first pregnancy, unspecified trimester: Secondary | ICD-10-CM

## 2012-01-11 NOTE — Patient Instructions (Addendum)
We are scheduling you for your appointment for induction today.  If you have any vaginal bleeding, gush of fluid, regular contractions (5 or more in space of an hour), increasing pain, or other concerns be sure to go to [facility] immediately.    Normal Labor and Delivery Your caregiver must first be sure you are in labor. Signs of labor include:  You may pass what is called "the mucus plug" before labor begins. This is a small amount of blood stained mucus.   Regular uterine contractions.   The time between contractions get closer together.   The discomfort and pain gradually gets more intense.   Pains are mostly located in the back.   Pains get worse when walking.   The cervix (the opening of the uterus becomes thinner (begins to efface) and opens up (dilates).  Once you are in labor and admitted into the hospital or care center, your caregiver will do the following:  A complete physical examination.   Check your vital signs (blood pressure, pulse, temperature and the fetal heart rate).   Do a vaginal examination (using a sterile glove and lubricant) to determine:   The position (presentation) of the baby (head [vertex] or buttock first).   The level (station) of the baby's head in the birth canal.   The effacement and dilatation of the cervix.   You may have your pubic hair shaved and be given an enema depending on your caregiver and the circumstance.   An electronic monitor is usually placed on your abdomen. The monitor follows the length and intensity of the contractions, as well as the baby's heart rate.   Usually, your caregiver will insert an IV in your arm with a bottle of sugar water. This is done as a precaution so that medications can be given to you quickly during labor or delivery.  NORMAL LABOR AND DELIVERY IS DIVIDED UP INTO 3 STAGES: First Stage This is when regular contractions begin and the cervix begins to efface and dilate. This stage can last from 3 to 15 hours.  The end of the first stage is when the cervix is 100% effaced and 10 centimeters dilated. Pain medications may be given by   Injection (morphine, demerol, etc.)   Regional anesthesia (spinal, caudal or epidural, anesthetics given in different locations of the spine). Paracervical pain medication may be given, which is an injection of and anesthetic on each side of the cervix.  A pregnant woman may request to have "Natural Childbirth" which is not to have any medications or anesthesia during her labor and delivery. Second Stage This is when the baby comes down through the birth canal (vagina) and is born. This can take 1 to 4 hours. As the baby's head comes down through the birth canal, you may feel like you are going to have a bowel movement. You will get the urge to bear down and push until the baby is delivered. As the baby's head is being delivered, the caregiver will decide if an episiotomy (a cut in the perineum and vagina area) is needed to prevent tearing of the tissue in this area. The episiotomy is sewn up after the delivery of the baby and placenta. Sometimes a mask with nitrous oxide is given for the mother to breath during the delivery of the baby to help if there is too much pain. The end of Stage 2 is when the baby is fully delivered. Then when the umbilical cord stops pulsating it is clamped and  cut. Third Stage The third stage begins after the baby is completely delivered and ends after the placenta (afterbirth) is delivered. This usually takes 5 to 30 minutes. After the placenta is delivered, a medication is given either by intravenous or injection to help contract the uterus and prevent bleeding. The third stage is not painful and pain medication is usually not necessary. If an episiotomy was done, it is repaired at this time. After the delivery, the mother is watched and monitored closely for 1 to 2 hours to make sure there is no postpartum bleeding (hemorrhage). If there is a lot of  bleeding, medication is given to contract the uterus and stop the bleeding. Document Released: 01/14/2008 Document Revised: 03/26/2011 Document Reviewed: 01/14/2008 [facility] Patient Information 2012 [place], [place].  Labor Induction  Most women go into labor on their own between 70 and 42 weeks of the pregnancy. When this does not happen or when there is a medical need, medicine or other methods may be used to induce labor. Labor induction causes a pregnant woman's uterus to contract. It also causes the cervix to soften (ripen), open (dilate), and thin out (efface). Usually, labor is not induced before 39 weeks of the pregnancy unless there is a problem with the baby or mother. Whether your labor will be induced depends on a number of factors, including the following:  The medical condition of you and the baby.   How many weeks along you are.   The status of baby's lung maturity.   The condition of the cervix.   The position of the baby.  REASONS FOR LABOR INDUCTION  The health of the baby or mother is at risk.   The pregnancy is overdue by 1 week or more.   The water breaks but labor does not start on its own.   The mother has a health condition or serious illness such as high blood pressure, infection, placental abruption, or diabetes.   The amniotic fluid amounts are low around the baby.   The baby is distressed.  REASONS TO NOT INDUCE LABOR Labor induction may not be a good idea if:  It is shown that your baby does not tolerate labor.   An induction is just more convenient.   You want the baby to be born on a certain date, like a holiday.   You have had previous surgeries on your uterus, such as a myomectomy or the removal of fibroids.   Your placenta lies very low in the uterus and blocks the opening of the cervix (placenta previa).   Your baby is not in a head down position.   The umbilical cord drops down into the birth canal in front of the baby. This could cut  off the baby's blood and oxygen supply.   You have had a previous cesarean delivery.   There areunusual circumstances, such as the baby being extremely premature.  RISKS AND COMPLICATIONS Problems may occur in the process of induction and plans may need to be modified as a situation unfolds. Some of the risks of induction include:  Change in fetal heart rate, such as too high, too low, or erratic.   Risk of fetal distress.   Risk of infection to mother and baby.   Increased chance of having a cesarean delivery.   The rare, but increased chance that the placenta will separate from the uterus (abruption).   Uterine rupture (very rare).  When induction is needed for medical reasons, the benefits of induction may  outweigh the risks. BEFORE THE PROCEDURE Your caregiver will check your cervix and the baby's position. This will help your caregiver decide if you are far enough along for an induction to work. PROCEDURE Several methods of labor induction may be used, such as:   Taking prostaglandin medicine to dilate and ripen the cervix. The medicine will also start contractions. It can be taken by mouth or by inserting a suppository into the vagina.   A thin tube (catheter) with a balloon on the end may be inserted into your vagina to dilate the cervix. Once inserted, the balloon expands with water, which causes the cervix to open.   Striping the membranes. Your caregiver inserts a finger between the cervix and membranes, which causes the cervix to be stretched and may cause the uterus to contract. This is often done during an office visit. You will be sent home to wait for the contractions to begin. You will then come in for an induction.   Breaking the water. Your caregiver will make a hole in the amniotic sac using a small instrument. Once the amniotic sac breaks, contractions should begin. This may still take hours to see an effect.   Taking medicine to trigger or strengthen  contractions. This medicine is given intravenously through a tube in your arm.  All of the methods of induction, besides stripping the membranes, will be done in the hospital. Induction is done in the hospital so that you and the baby can be carefully monitored. AFTER THE PROCEDURE Some inductions can take up to 2 or 3 days. Depending on the cervix, it usually takes less time. It takes longer when you are induced early in the pregnancy or if this is your first pregnancy. If a mother is still pregnant and the induction has been going on for 2 to 3 days, either the mother will be sent home or a cesarean delivery will be needed. Document Released: 08/26/2006 Document Revised: 03/26/2011 Document Reviewed: 02/09/2011 [facility] Patient Information 2012 [place], [place].

## 2012-01-11 NOTE — Progress Notes (Signed)
21 yo G1 at 40.6 wks today.  No complaints today.  Irregular mild contractions.  No vaginal bleeding or discharge.  Good movement.   Lost 2 lbs from last measured weight.  Patient eating well, drinking well.  Patient's mother also corroborates eating well.  She states that last time she was weighed she had on shoes and jeans, did not have them on today.   Tdap today.   A/P: 21 yo G1 at 105w2d  - continue prenatal  - GBS negative  - post-dates NST/AFI WNL  - discussed walking/sex/spicy food as ways to encourage labor  - induction planned today - scheduled for Wednesday AM.   - Will forward to PCP - labor precautions provided both orally and written.

## 2012-01-12 ENCOUNTER — Telehealth (HOSPITAL_COMMUNITY): Payer: Self-pay | Admitting: *Deleted

## 2012-01-12 NOTE — Telephone Encounter (Signed)
Preadmission screen  

## 2012-01-13 ENCOUNTER — Encounter (HOSPITAL_COMMUNITY): Payer: Self-pay

## 2012-01-13 ENCOUNTER — Inpatient Hospital Stay (HOSPITAL_COMMUNITY)
Admission: RE | Admit: 2012-01-13 | Discharge: 2012-01-16 | DRG: 774 | Disposition: A | Discharge status: Home / Self Care | Payer: Medicaid Other | Source: Ambulatory Visit | Attending: Obstetrics & Gynecology | Admitting: Obstetrics & Gynecology

## 2012-01-13 DIAGNOSIS — O9903 Anemia complicating the puerperium: Secondary | ICD-10-CM | POA: Diagnosis not present

## 2012-01-13 DIAGNOSIS — O480 Post-term pregnancy: Principal | ICD-10-CM | POA: Diagnosis present

## 2012-01-13 DIAGNOSIS — D62 Acute posthemorrhagic anemia: Secondary | ICD-10-CM | POA: Diagnosis not present

## 2012-01-13 LAB — CBC
HCT: 32.8 % — ABNORMAL LOW (ref 36.0–46.0)
Hemoglobin: 10.2 g/dL — ABNORMAL LOW (ref 12.0–15.0)
MCV: 87 fL (ref 78.0–100.0)
Platelets: 242 10*3/uL (ref 150–400)
RBC: 3.77 MIL/uL — ABNORMAL LOW (ref 3.87–5.11)
WBC: 6.9 10*3/uL (ref 4.0–10.5)

## 2012-01-13 MED ORDER — EPHEDRINE 5 MG/ML INJ
10.0000 mg | INTRAVENOUS | Status: DC | PRN
  Filled 2012-01-13: qty 4

## 2012-01-13 MED ORDER — EPHEDRINE 5 MG/ML INJ: 10.0000 mg | INTRAVENOUS | Status: DC | PRN

## 2012-01-13 MED ORDER — CITRIC ACID-SODIUM CITRATE 334-500 MG/5ML PO SOLN: 30.0000 mL | ORAL | Status: DC | PRN

## 2012-01-13 MED ORDER — MISOPROSTOL 25 MCG QUARTER TABLET
25.0000 ug | ORAL_TABLET | ORAL | Status: DC | PRN
  Administered 2012-01-13 (×3): 25 ug via VAGINAL
  Filled 2012-01-13 (×3): qty 0.25

## 2012-01-13 MED ORDER — LACTATED RINGERS IV SOLN
INTRAVENOUS | Status: DC
  Administered 2012-01-13 – 2012-01-14 (×8): via INTRAVENOUS

## 2012-01-13 MED ORDER — DIPHENHYDRAMINE HCL 50 MG/ML IJ SOLN: 12.5000 mg | INTRAMUSCULAR | Status: DC | PRN

## 2012-01-13 MED ORDER — PHENYLEPHRINE 40 MCG/ML (10ML) SYRINGE FOR IV PUSH (FOR BLOOD PRESSURE SUPPORT): 80.0000 ug | PREFILLED_SYRINGE | INTRAVENOUS | Status: DC | PRN

## 2012-01-13 MED ORDER — PHENYLEPHRINE 40 MCG/ML (10ML) SYRINGE FOR IV PUSH (FOR BLOOD PRESSURE SUPPORT)
80.0000 ug | PREFILLED_SYRINGE | INTRAVENOUS | Status: DC | PRN
  Filled 2012-01-13: qty 5

## 2012-01-13 MED ORDER — DIPHENHYDRAMINE HCL 50 MG/ML IJ SOLN
12.5000 mg | INTRAMUSCULAR | Status: DC | PRN
Start: 2012-01-13 — End: 2012-01-13

## 2012-01-13 MED ORDER — ACETAMINOPHEN 325 MG PO TABS: 650.0000 mg | ORAL_TABLET | ORAL | Status: DC | PRN

## 2012-01-13 MED ORDER — OXYTOCIN 40 UNITS IN LACTATED RINGERS INFUSION - SIMPLE MED
62.5000 mL/h | Freq: Once | INTRAVENOUS | Status: DC
  Filled 2012-01-13: qty 1000

## 2012-01-13 MED ORDER — TERBUTALINE SULFATE 1 MG/ML IJ SOLN: 0.2500 mg | Freq: Once | INTRAMUSCULAR | Status: AC | PRN

## 2012-01-13 MED ORDER — OXYTOCIN BOLUS FROM INFUSION
500.0000 mL | Freq: Once | INTRAVENOUS | Status: DC
  Filled 2012-01-13: qty 500

## 2012-01-13 MED ORDER — FLEET ENEMA 7-19 GM/118ML RE ENEM: 1.0000 | ENEMA | RECTAL | Status: DC | PRN

## 2012-01-13 MED ORDER — OXYCODONE-ACETAMINOPHEN 5-325 MG PO TABS: 1.0000 | ORAL_TABLET | ORAL | Status: DC | PRN

## 2012-01-13 MED ORDER — IBUPROFEN 600 MG PO TABS: 600.0000 mg | ORAL_TABLET | Freq: Four times a day (QID) | ORAL | Status: DC | PRN

## 2012-01-13 MED ORDER — LACTATED RINGERS IV SOLN
500.0000 mL | INTRAVENOUS | Status: DC | PRN
  Administered 2012-01-14 (×3): 500 mL via INTRAVENOUS

## 2012-01-13 MED ORDER — LACTATED RINGERS IV SOLN: 500.0000 mL | Freq: Once | INTRAVENOUS | Status: DC

## 2012-01-13 MED ORDER — FENTANYL 2.5 MCG/ML BUPIVACAINE 1/10 % EPIDURAL INFUSION (WH - ANES): 14.0000 mL/h | INTRAMUSCULAR | Status: DC

## 2012-01-13 MED ORDER — FENTANYL 2.5 MCG/ML BUPIVACAINE 1/10 % EPIDURAL INFUSION (WH - ANES)
14.0000 mL/h | INTRAMUSCULAR | Status: DC
  Administered 2012-01-14 (×5): 14 mL/h via EPIDURAL
  Filled 2012-01-13 (×6): qty 60

## 2012-01-13 MED ORDER — LIDOCAINE HCL (PF) 1 % IJ SOLN
30.0000 mL | INTRAMUSCULAR | Status: DC | PRN
  Filled 2012-01-13: qty 30

## 2012-01-13 MED ORDER — ONDANSETRON HCL 4 MG/2ML IJ SOLN
4.0000 mg | Freq: Four times a day (QID) | INTRAMUSCULAR | Status: DC | PRN
  Administered 2012-01-14: 4 mg via INTRAVENOUS
  Filled 2012-01-13: qty 2

## 2012-01-13 NOTE — Progress Notes (Signed)
Cassandra [name] is a 21 y.o. G1P0000 at [redacted]w[redacted]d admitted for induction of labor due to Post dates.  Subjective: Doing well.  Mild contractions.  Objective: BP 124/66  Pulse 93  Temp 98.6 F (37 C) (Oral)  Resp 20  Ht 5\' 4"  (1.626 m)  Wt 158 lb (71.668 kg)  BMI 27.12 kg/m2  LMP 04/10/2011      FHT:  FHR: 140 bpm, variability: moderate,  accelerations:  Present,  decelerations:  Absent UC:   irregular, every 3-6 minutes SVE:   Dilation: 1.5 Effacement (%): 30 Station: -3 Exam by:: Elwyn Reach MD  Labs: Lab Results  Component Value Date   WBC 6.9 01/13/2012   HGB 10.2* 01/13/2012   HCT 32.8* 01/13/2012   MCV 87.0 01/13/2012   PLT 242 01/13/2012    Assessment / Plan: Induction of labor due to postterm  Labor: Cervical ripening with cytotec; 3rd cytotec placed 2100; consider foley bulb at 0100 if cervix not continuing to soften Fetal Wellbeing:  Category I Pain Control:  Labor support without medications and planning epidural I/D:  GBS negative Anticipated MOD:  NSVD  BOOTH, Daysha Ashmore 01/13/2012, 9:00 PM

## 2012-01-13 NOTE — Progress Notes (Signed)
Cassandra [name] is a 21 y.o. G1P0 at [redacted]w[redacted]d admitted for induction of labor due to Post dates.   Subjective: Pt feeling mild contractions.  Objective: BP 122/67  Pulse 106  Temp 99.1 F (37.3 C) (Oral)  Resp 20  Ht 5\' 4"  (1.626 m)  Wt 71.668 kg (158 lb)  BMI 27.12 kg/m2  LMP 04/10/2011     FHT:  FHR: 140s bpm, variability: moderate,  accelerations:  Present,  decelerations:  Absent UC:   regular, every 4-6 minutes SVE:   Dilation: 1 Effacement (%): Thick Station: -3 Exam by:: THekkekamdam MD  Labs: Lab Results  Component Value Date   WBC 6.9 01/13/2012   HGB 10.2* 01/13/2012   HCT 32.8* 01/13/2012   MCV 87.0 01/13/2012   PLT 242 01/13/2012    Assessment / Plan:  This is a 21 y.o. G1P0 at [redacted]w[redacted]d here for IOL due to postdates.  1. Management of labor  - s/p cytotec x 2, next cytotec v reevaluation at 7:30 pm - Plan for pitocin once cervix slightly more dilated  - anticipate NSVD - Currently with no pain medications as not in active labor; epidural upon request  2. Fetal wellbeing - Category I tracing   3. Postpartum planning  - Plan for breastfeeding, thinking about implanon, cord blood donor    Simone Curia 01/13/2012, 3:26 PM

## 2012-01-13 NOTE — H&P (Signed)
Cassandra [name] is a 21 y.o. female presenting for induction of labor due to postdates.  History  This is a 21 y.o. G1P0 at [redacted]w[redacted]d here for IOL due to postdates.  Pt states she feels no contractions, no gush of fluids, reports positive fetal movement, no vaginal bleeding since 2 weeks ago when she had a mucus plug discharge.  Received prenatal care at [facility] with Drs. Elwyn Reach and [employer].  No complications other than positive chlamydia which was negative after treatment.  OB History    Grav Para Term Preterm Abortions TAB SAB Ect Mult Living   1              Past Medical History  Diagnosis Date  . No pertinent past medical history   Meds - PNV and iron NKDA No h/o hospitalizations  Past Surgical History  Procedure Date  . Wisdom teeth 2009   Family History: family history includes Asthma in her brother, mother, and sister; Cancer in her maternal uncle; Diabetes in her mother; and Hypertension in her paternal grandfather. Social History:  reports that she has never smoked. She has never used smokeless tobacco. She reports that she does not drink alcohol or use illicit drugs. Denies tobacco, drugs, alcohol. Lives with mother, brother, sister. Worked at [employer] until maternity leave.   Prenatal Transfer Tool  Maternal Diabetes: No - CBG 7/16 88 Genetic Screening: Too late for screening Maternal Ultrasounds/Referrals: Normal Fetal Ultrasounds or other Referrals:  None Maternal Substance Abuse:  No Significant Maternal Medications:  None other than iron Significant Maternal Lab Results:  Lab values include: Group B Strep negative, Other: Positive Chlamydia, neg on retest. Other Comments:  None  ROS Per HPI, otherwise negative.  Dilation: 1 Effacement (%): Thick Station: -3 Exam by:: Dr.Thekkekandam Blood pressure 130/69, pulse 98, temperature 99 F (37.2 C), temperature source Oral, resp. rate 20, height 5\' 4"  (1.626 m), weight 71.668 kg (158 lb), last  menstrual period 04/10/2011. Exam Physical Exam  GEN: NAD CV: RRR, 2+ bilateral DP pulses PULM: CTAB ABD: gravid EXTR: no bilateral LE edema Bedside sono: vertex Fetal wellbeing: Baseline rate 135-140bpm, accelerations present, moderate variability, no decelerations, category I  Prenatal labs: ABO, Rh: A/POS/-- (05/15 1024) Antibody: NEG (05/15 1024) Rubella: 59.5 (05/15 1024) RPR: NON REAC (07/16 1526)  HBsAg: NEGATIVE (05/15 1024)  HIV: NON REACTIVE (07/16 1526)  GBS: NEGATIVE (09/06 1030)   Assessment/Plan: This is a 21 y.o. G1P0 at [redacted]w[redacted]d here for IOL due to postdates.  1. Management of labor - Admit to birthing suites for induction of labor  - Contacted Dr. Elwyn Reach with [facility] regarding admit - Induction of labor - cervical ripening with cytotec - Plan for pitocin once cervix slightly more dilated  2. Fetal wellbeing - Category I tracing  3. Postpartum planning - Plan for breastfeeding, thinking about implanon, cord blood donor   Simone Curia 01/13/2012, 11:41 AM   Evaluation and management procedures were performed by Resident physician under my supervision/collaboration. Chart reviewed, patient examined by me and I agree with management and plan. Danae Orleans, CNM 01/13/2012 1:31 PM

## 2012-01-13 NOTE — Progress Notes (Signed)
Brief Progress Note S: Doing well, starting to feel mild contractions.  Up walking and otherwise doing well. O: BP 124/66  Pulse 93  Temp 98.6 F (37 C) (Oral)  Resp 20  Ht 5\' 4"  (1.626 m)  Wt 158 lb (71.668 kg)  BMI 27.12 kg/m2  LMP 04/10/2011 FHT: 140, moderate variability, + accels, no decels  A/P: 21 yo G1 at [redacted]w[redacted]d being induced for post-dates - doing well - continue cytotec q4hr until cervical change - will continue to monitor - appreciate the [employer] Pract[employer] help in managing her induction  BOOTH, Cassandra [name] 01/13/2012, 5:13 PM

## 2012-01-14 ENCOUNTER — Encounter (HOSPITAL_COMMUNITY): Payer: Self-pay | Admitting: Anesthesiology

## 2012-01-14 ENCOUNTER — Inpatient Hospital Stay (HOSPITAL_COMMUNITY): Payer: Medicaid Other | Admitting: Anesthesiology

## 2012-01-14 ENCOUNTER — Encounter (HOSPITAL_COMMUNITY): Payer: Self-pay

## 2012-01-14 DIAGNOSIS — D62 Acute posthemorrhagic anemia: Secondary | ICD-10-CM

## 2012-01-14 DIAGNOSIS — O480 Post-term pregnancy: Secondary | ICD-10-CM

## 2012-01-14 MED ORDER — SODIUM CHLORIDE 0.9 % IV SOLN
2.0000 g | Freq: Four times a day (QID) | INTRAVENOUS | Status: DC
  Administered 2012-01-14: 2 g via INTRAVENOUS
  Filled 2012-01-14 (×2): qty 2000

## 2012-01-14 MED ORDER — ACETAMINOPHEN 325 MG PO TABS
650.0000 mg | ORAL_TABLET | Freq: Four times a day (QID) | ORAL | Status: DC | PRN
  Administered 2012-01-14: 650 mg via ORAL
  Filled 2012-01-14: qty 2

## 2012-01-14 MED ORDER — OXYTOCIN 40 UNITS IN LACTATED RINGERS INFUSION - SIMPLE MED
1.0000 m[IU]/min | INTRAVENOUS | Status: DC
  Administered 2012-01-14 (×2): 2 m[IU]/min via INTRAVENOUS

## 2012-01-14 MED ORDER — FENTANYL 2.5 MCG/ML BUPIVACAINE 1/10 % EPIDURAL INFUSION (WH - ANES)
INTRAMUSCULAR | Status: DC | PRN
  Administered 2012-01-14: 14 mL/h via EPIDURAL

## 2012-01-14 MED ORDER — ACETAMINOPHEN 500 MG PO TABS
1000.0000 mg | ORAL_TABLET | Freq: Once | ORAL | Status: AC
  Administered 2012-01-14: 1000 mg via ORAL
  Filled 2012-01-14: qty 2

## 2012-01-14 MED ORDER — GENTAMICIN SULFATE 40 MG/ML IJ SOLN
150.0000 mg | Freq: Three times a day (TID) | INTRAVENOUS | Status: DC
  Filled 2012-01-14: qty 3.75

## 2012-01-14 MED ORDER — LIDOCAINE HCL (PF) 1 % IJ SOLN
INTRAMUSCULAR | Status: DC | PRN
  Administered 2012-01-14 (×2): 9 mL

## 2012-01-14 MED ORDER — TERBUTALINE SULFATE 1 MG/ML IJ SOLN: 0.2500 mg | Freq: Once | INTRAMUSCULAR | Status: DC | PRN

## 2012-01-14 MED ORDER — DEXTROSE 5 % IV SOLN
160.0000 mg | Freq: Once | INTRAVENOUS | Status: AC
  Administered 2012-01-14: 160 mg via INTRAVENOUS
  Filled 2012-01-14: qty 4

## 2012-01-14 NOTE — Progress Notes (Signed)
Cassandra [name] is a 21 y.o. G1P0000 at [redacted]w[redacted]d admitted for induction of labor due to Post dates.  Subjective: Doing well.  Feeling a little pressure.  Objective: BP 99/57  Pulse 91  Temp 100.2 F (37.9 C) (Axillary)  Resp 18  Ht 5\' 4"  (1.626 m)  Wt 158 lb (71.668 kg)  BMI 27.12 kg/m2  SpO2 100%  LMP 04/10/2011      FHT:  FHR: 160 bpm, variability: moderate,  accelerations:  Present,  decelerations:  Present variables, lates UC:   regular, every 2-3 minutes SVE:   Dilation: 10 Effacement (%): 100 Station: +1;+2 Exam by:: Cassandra [name] CNM  Labs: Lab Results  Component Value Date   WBC 6.9 01/13/2012   HGB 10.2* 01/13/2012   HCT 32.8* 01/13/2012   MCV 87.0 01/13/2012   PLT 242 01/13/2012    Assessment / Plan: Induction of labor due to postterm,  progressing well on pitocin  Labor: complete; laboring down Fetal Wellbeing:  Category II Pain Control:  Epidural I/D:  low grade temp of 100.2.  Ruptured for 16 hours now.  Will recheck temp in 30 minutes. Anticipated MOD:  NSVD  Cassandra [name], Cassandra [name] 01/14/2012, 7:57 PM

## 2012-01-14 NOTE — Progress Notes (Signed)
ANTIBIOTIC CONSULT NOTE - INITIAL  Pharmacy Consult for Gentamicin Indication: Maternal temp during labor; suspected chorioamnionitis  No Known Allergies  Patient Measurements: Height: 5\' 4"  (162.6 cm) Weight: 158 lb (71.668 kg) IBW/kg (Calculated) : 54.7  Adjusted Body Weight: 59.8kg  Vital Signs: Temp: 100.5 F (38.1 C) (09/26 2008) Temp src: Oral (09/26 2008) BP: 134/73 mmHg (09/26 2031) Pulse Rate: 98  (09/26 2031)   Labs:  Basename 01/13/12 0725  WBC 6.9  HGB 10.2*  PLT 242  [facility] --  CREATININE --   Estimated SCr=0.7 with estimated CrCl > 128ml/min    Medical History: Past Medical History  Diagnosis Date  . No pertinent past medical history     Medications:  Ampicillin 2 gram IV q6h Assessment: 21yo F admitted for IOL due to postdate. Now, pt has developed maternal temp during labor.  Goal of Therapy:  Gentamicin peaks 6-8 mcg/ml and trough < 43mcg/ml Plan:  1. Gentamicin 160mg  x 1 then 150mg  IV q8h. 2. Will draw SCreatinine if antibiotics continued post-delivery > 24 hours. 3. Will continue to follow and assess need for Gentamicin levels as clinically indicated.  Thanks! Claybon Jabs 01/14/2012,9:06 PM

## 2012-01-14 NOTE — Progress Notes (Signed)
Dr. Jerelyn Charles, notified about temp.

## 2012-01-14 NOTE — Progress Notes (Signed)
Cassandra [name] is a 21 y.o. G1P0000 at [redacted]w[redacted]d admitted for induction of labor due to Post dates. Due date 01/05/12.  Subjective: Comfortable w/epidural.  Objective: BP 94/52  Pulse 81  Temp 98.2 F (36.8 C) (Oral)  Resp 18  Ht 5\' 4"  (1.626 m)  Wt 71.668 kg (158 lb)  BMI 27.12 kg/m2  SpO2 99%  LMP 04/10/2011      FHT:  FHR: 140 bpm, variability: moderate,  accelerations:  Present,  decelerations:  Present late UC:   irregular, every 2-6 minutes SVE:   Dilation: 6 Effacement (%): 80 Station: -2 Exam by:: JDaley  Labs: Lab Results  Component Value Date   WBC 6.9 01/13/2012   HGB 10.2* 01/13/2012   HCT 32.8* 01/13/2012   MCV 87.0 01/13/2012   PLT 242 01/13/2012    Assessment / Plan: Induction of labor due to postterm,  progressing well on pitocin  Labor: Progressing normally Fetal Wellbeing:  Category II Pain Control:  Epidural I/D:  n/a Anticipated MOD:  NSVD  Lawernce Pitts 01/14/2012, 6:31 AM

## 2012-01-14 NOTE — Anesthesia Procedure Notes (Signed)
Epidural Patient location during procedure: OB Start time: 01/14/2012 4:52 AM End time: 01/14/2012 4:57 AM  Staffing Anesthesiologist: Sandrea Hughs Performed by: anesthesiologist   Preanesthetic Checklist Completed: patient identified, site marked, surgical consent, pre-op evaluation, timeout performed, IV checked, risks and benefits discussed and monitors and equipment checked  Epidural Patient position: sitting Prep: site prepped and draped and DuraPrep Patient monitoring: continuous pulse ox and blood pressure Approach: midline Injection technique: LOR air  Needle:  Needle type: Tuohy  Needle gauge: 17 G Needle length: 9 cm and 9 Needle insertion depth: 5 cm cm Catheter type: closed end flexible Catheter size: 19 Gauge Catheter at skin depth: 10 cm Test dose: negative and Other  Assessment Sensory level: T8 Events: blood not aspirated, injection not painful, no injection resistance, negative IV test and no paresthesia  Additional Notes Reason for block:procedure for pain

## 2012-01-14 NOTE — Progress Notes (Signed)
Patient ID: Cassandra [name], female   DOB: 20-Sep-1990, 20 y.o.   MRN: [id number] S:  Comfortable and blocked, has felt some pain last few contractions.  OCeasar Mons Vitals:   01/14/12 0931 01/14/12 1001 01/14/12 1003 01/14/12 1009  BP: 112/69 115/68    Pulse: 81 92    Temp: 98.3 F (36.8 C)     TempSrc: Oral     Resp: 20 20 20 20   Height:      Weight:      SpO2:        SVE:  5/90/-2 FHTs:  135, moderate variability, accels present, occasional variable CTX:  q 2 to 5 minutes  A/P: 21 y.o. G1P0000 at [redacted]w[redacted]d here for IOL for post-dates. - IOL:  Progressing with effacement but not much dilation yet, irregular contractions. IUPC placed, pitocin started.  Napoleon Form,  MD

## 2012-01-14 NOTE — Progress Notes (Signed)
Cassandra [name] is a 21 y.o. G1P0000 at [redacted]w[redacted]d admitted for induction of labor due to Post dates.  Subjective: Doing well.  Comfortable with epidural  Objective: BP 121/70  Pulse 81  Temp 97.5 F (36.4 C) (Oral)  Resp 20  Ht 5\' 4"  (1.626 m)  Wt 158 lb (71.668 kg)  BMI 27.12 kg/m2  SpO2 100%  LMP 04/10/2011      FHT:  FHR: 125 bpm, variability: moderate,  accelerations:  Present,  decelerations:  Absent UC:   regular, every 2-3 minutes SVE:   Dilation: 9 Effacement (%): 100 Station: 0 Exam by:: Dr. Elwyn Reach  Labs: Lab Results  Component Value Date   WBC 6.9 01/13/2012   HGB 10.2* 01/13/2012   HCT 32.8* 01/13/2012   MCV 87.0 01/13/2012   PLT 242 01/13/2012    Assessment / Plan: Induction of labor due to postterm,  progressing well on pitocin  Labor: Progressing on pitocin Fetal Wellbeing:  Category I Pain Control:  Epidural I/D:  n/a Anticipated MOD:  NSVD  Cassandra [name], Cassandra [name] 01/14/2012, 4:31 PM

## 2012-01-14 NOTE — Progress Notes (Signed)
   Cassandra [name] is a 21 y.o. G1P0000 at [redacted]w[redacted]d  admitted for induction of labor due to posst dates.  Subjective: No c/p pressure, just some abdominal discomfort from contractions  Objective: BP 112/65  Pulse 69  Temp 98.6 F (37 C) (Oral)  Resp 20  Ht 5\' 4"  (1.626 m)  Wt 158 lb (71.668 kg)  BMI 27.12 kg/m2  SpO2 100%  LMP 04/10/2011    FHT:  FHR: 140 bpm, variability: moderate,  accelerations:  Present,  decelerations:  Absent UC:   irregular, every 2-4 minutes;  MVU's ~ 225 SVE:   Tiny anterior lip, essentially C/C/0 station  Labs: Lab Results  Component Value Date   WBC 6.9 01/13/2012   HGB 10.2* 01/13/2012   HCT 32.8* 01/13/2012   MCV 87.0 01/13/2012   PLT 242 01/13/2012    Assessment / Plan: Induction of labor due to postterm,  progressing well on pitocin;  Will bolus epidural, labor down  Labor: Progressing normally Fetal Wellbeing:  Category I Pain Control:  Epidural Anticipated MOD:  NSVD  CRESENZO-DISHMAN,Weslee Prestage 01/14/2012, 6:16 PM

## 2012-01-14 NOTE — Anesthesia Preprocedure Evaluation (Signed)
Anesthesia Evaluation  Patient identified by MRN, date of birth, ID band Patient awake    Reviewed: Allergy & Precautions, H&P , NPO status , Patient's Chart, lab work & pertinent test results  Airway Mallampati: I TM Distance: >3 FB Neck ROM: full    Dental No notable dental hx.    Pulmonary neg pulmonary ROS,  breath sounds clear to auscultation  Pulmonary exam normal       Cardiovascular negative cardio ROS      Neuro/Psych negative psych ROS   GI/Hepatic negative GI ROS, Neg liver ROS,   Endo/Other  negative endocrine ROS  Renal/GU negative Renal ROS  negative genitourinary   Musculoskeletal negative musculoskeletal ROS (+)   Abdominal Normal abdominal exam  (+) - obese,   Peds negative pediatric ROS (+)  Hematology negative hematology ROS (+)   Anesthesia Other Findings   Reproductive/Obstetrics (+) Pregnancy                           Anesthesia Physical Anesthesia Plan  ASA: II  Anesthesia Plan: Epidural   Post-op Pain Management:    Induction:   Airway Management Planned:   Additional Equipment:   Intra-op Plan:   Post-operative Plan:   Informed Consent: I have reviewed the patients History and Physical, chart, labs and discussed the procedure including the risks, benefits and alternatives for the proposed anesthesia with the patient or authorized representative who has indicated his/her understanding and acceptance.     Plan Discussed with:   Anesthesia Plan Comments:         Anesthesia Quick Evaluation

## 2012-01-14 NOTE — Progress Notes (Signed)
NYELLIE WINDING is a 21 y.o. G1P0000 at [redacted]w[redacted]d admitted for induction of labor due to Post dates.  Subjective: Doing well.  Feeling the contractions some, but not bothersome.  Objective: BP 120/72  Pulse 73  Temp 98.4 F (36.9 C) (Oral)  Resp 20  Ht 5\' 4"  (1.626 m)  Wt 158 lb (71.668 kg)  BMI 27.12 kg/m2  SpO2 100%  LMP 04/10/2011      FHT:  FHR: 130 bpm, variability: moderate,  accelerations:  Present,  decelerations:  Present variables and a few lates (these have improved) UC:   regular, every 2-3 minutes SVE:   Dilation: 6.5 Effacement (%): 90 Station: -2 Exam by:: Dr. Elwyn Reach  Labs: Lab Results  Component Value Date   WBC 6.9 01/13/2012   HGB 10.2* 01/13/2012   HCT 32.8* 01/13/2012   MCV 87.0 01/13/2012   PLT 242 01/13/2012    Assessment / Plan: Induction of labor due to postterm,  progressing well on pitocin  Labor: On pitocin, will continue to increase as tolerated; contractions became adequate around 1330 Fetal Wellbeing:  Category II Pain Control:  Epidural I/D:  n/a Anticipated MOD:  NSVD  BOOTH, ERIN 01/14/2012, 2:25 PM

## 2012-01-15 LAB — CBC
MCH: 26.9 pg (ref 26.0–34.0)
MCV: 85.8 fL (ref 78.0–100.0)
Platelets: 234 10*3/uL (ref 150–400)
RBC: 3.09 MIL/uL — ABNORMAL LOW (ref 3.87–5.11)

## 2012-01-15 MED ORDER — MAGNESIUM HYDROXIDE 400 MG/5ML PO SUSP: 30.0000 mL | ORAL | Status: DC | PRN

## 2012-01-15 MED ORDER — BENZOCAINE-MENTHOL 20-0.5 % EX AERO
1.0000 "application " | INHALATION_SPRAY | CUTANEOUS | Status: DC | PRN
  Filled 2012-01-15: qty 56

## 2012-01-15 MED ORDER — MEASLES, MUMPS & RUBELLA VAC ~~LOC~~ INJ
0.5000 mL | INJECTION | Freq: Once | SUBCUTANEOUS | Status: DC
  Filled 2012-01-15: qty 0.5

## 2012-01-15 MED ORDER — DIBUCAINE 1 % RE OINT: 1.0000 "application " | TOPICAL_OINTMENT | RECTAL | Status: DC | PRN

## 2012-01-15 MED ORDER — PRENATAL MULTIVITAMIN CH
1.0000 | ORAL_TABLET | Freq: Every day | ORAL | Status: DC
  Administered 2012-01-15 – 2012-01-16 (×2): 1 via ORAL
  Filled 2012-01-15: qty 1

## 2012-01-15 MED ORDER — OXYCODONE-ACETAMINOPHEN 5-325 MG PO TABS
1.0000 | ORAL_TABLET | ORAL | Status: DC | PRN
  Administered 2012-01-15 – 2012-01-16 (×2): 1 via ORAL
  Filled 2012-01-15 (×2): qty 1

## 2012-01-15 MED ORDER — WITCH HAZEL-GLYCERIN EX PADS: 1.0000 "application " | MEDICATED_PAD | CUTANEOUS | Status: DC | PRN

## 2012-01-15 MED ORDER — FERROUS SULFATE 325 (65 FE) MG PO TABS
325.0000 mg | ORAL_TABLET | Freq: Two times a day (BID) | ORAL | Status: DC
  Administered 2012-01-15 – 2012-01-16 (×3): 325 mg via ORAL
  Filled 2012-01-15 (×3): qty 1

## 2012-01-15 MED ORDER — ONDANSETRON HCL 4 MG/2ML IJ SOLN: 4.0000 mg | INTRAMUSCULAR | Status: DC | PRN

## 2012-01-15 MED ORDER — ZOLPIDEM TARTRATE 5 MG PO TABS: 5.0000 mg | ORAL_TABLET | Freq: Every evening | ORAL | Status: DC | PRN

## 2012-01-15 MED ORDER — METHYLERGONOVINE MALEATE 0.2 MG/ML IJ SOLN: 0.2000 mg | INTRAMUSCULAR | Status: DC | PRN

## 2012-01-15 MED ORDER — METHYLERGONOVINE MALEATE 0.2 MG PO TABS: 0.2000 mg | ORAL_TABLET | ORAL | Status: DC | PRN

## 2012-01-15 MED ORDER — SENNOSIDES-DOCUSATE SODIUM 8.6-50 MG PO TABS
2.0000 | ORAL_TABLET | Freq: Every day | ORAL | Status: DC
  Administered 2012-01-15: 2 via ORAL

## 2012-01-15 MED ORDER — TETANUS-DIPHTH-ACELL PERTUSSIS 5-2.5-18.5 LF-MCG/0.5 IM SUSP: 0.5000 mL | Freq: Once | INTRAMUSCULAR | Status: DC

## 2012-01-15 MED ORDER — ONDANSETRON HCL 4 MG PO TABS: 4.0000 mg | ORAL_TABLET | ORAL | Status: DC | PRN

## 2012-01-15 MED ORDER — LANOLIN HYDROUS EX OINT: TOPICAL_OINTMENT | CUTANEOUS | Status: DC | PRN

## 2012-01-15 MED ORDER — IBUPROFEN 600 MG PO TABS
600.0000 mg | ORAL_TABLET | Freq: Four times a day (QID) | ORAL | Status: DC
  Administered 2012-01-15 – 2012-01-16 (×6): 600 mg via ORAL
  Filled 2012-01-15 (×6): qty 1

## 2012-01-15 MED ORDER — SIMETHICONE 80 MG PO CHEW: 80.0000 mg | CHEWABLE_TABLET | ORAL | Status: DC | PRN

## 2012-01-15 MED ORDER — DIPHENHYDRAMINE HCL 25 MG PO CAPS: 25.0000 mg | ORAL_CAPSULE | Freq: Four times a day (QID) | ORAL | Status: DC | PRN

## 2012-01-15 NOTE — Progress Notes (Signed)
UR Chart review completed.  

## 2012-01-15 NOTE — Anesthesia Postprocedure Evaluation (Signed)
  Anesthesia Post-op Note  Patient: Cassandra [name]  Procedure(s) Performed: * No procedures listed *  Patient Location: PACU and Mother/Baby  Anesthesia Type: Epidural  Level of Consciousness: awake, alert  and oriented  Airway and Oxygen Therapy: Patient Spontanous Breathing  Post-op Pain: none  Post-op Assessment: Post-op Vital signs reviewed  Post-op Vital Signs: Reviewed and stable  Complications: No apparent anesthesia complications

## 2012-01-15 NOTE — Progress Notes (Cosign Needed)
Post Partum Day 1 Subjective: no complaints, up ad lib, voiding, tolerating PO and + flatus, moderate lochia, no dizziness or shortness of breath, absent BM, present flatus, plans to breastfeed, considering Nexplanon.  Objective: Blood pressure 87/53, pulse 93, temperature 98.4 F (36.9 C), temperature source Axillary, resp. rate 20, height 5\' 4"  (1.626 m), weight 158 lb (71.668 kg), last menstrual period 04/10/2011, SpO2 98.00%, unknown if currently breastfeeding.  Physical Exam:  General: alert, cooperative, appears stated age and no distress Lochia: appropriate Chest: CTAB Heart: RRR no m/r/g Abdomen: +BS, soft, nontender,  Uterine Fundus: firm DVT Evaluation: No evidence of DVT seen on physical exam. Negative Homan's sign. No cords or calf tenderness. No significant calf/ankle edema. Extremities:    Basename 01/15/12 0540 01/13/12 0725  HGB 8.3* 10.2*  HCT 26.5* 32.8*    Assessment/Plan: Plan for discharge tomorrow, Breastfeeding, Lactation consult and Contraception to be determined at [facility] visit Acute blood loss anemia - asymptomatic, continue iron supplements Monitor for return of fever   LOS: 2 days   [name], Cassandra Zullo 01/15/2012, 7:46 AM

## 2012-01-16 MED ORDER — SENNOSIDES-DOCUSATE SODIUM 8.6-50 MG PO TABS: 2.0000 | ORAL_TABLET | Freq: Every day | ORAL | Status: DC

## 2012-01-16 MED ORDER — IBUPROFEN 600 MG PO TABS: 600.0000 mg | ORAL_TABLET | Freq: Four times a day (QID) | ORAL | Status: DC

## 2012-01-16 NOTE — Discharge Summary (Signed)
  Obstetric Discharge Summary Reason for Admission: induction of labor Prenatal Procedures: NST and ultrasound Intrapartum Procedures: spontaneous vaginal delivery and antibiotics for maternal fever Postpartum Procedures: none Complications-Operative and Postpartum: 2nd degree perineal laceration Hemoglobin  Date Value Range Status  01/15/2012 8.3* 12.0 - 15.0 g/dL Final     DELTA CHECK NOTED     REPEATED TO VERIFY     HCT  Date Value Range Status  01/15/2012 26.5* 36.0 - 46.0 % Final    Physical Exam:  General: alert, cooperative, appears stated age and no distress Lochia: appropriate Uterine Fundus: firm Incision: n/a DVT Evaluation: No evidence of DVT seen on physical exam. Negative Homan's sign. No cords or calf tenderness. No significant calf/ankle edema.  Discharge Diagnoses: Term Pregnancy-delivered  Discharge Information: Date: 01/16/2012 Activity: pelvic rest Diet: routine Medications: PNV, Ibuprofen, Iron and senna Condition: stable Instructions: refer to practice specific booklet Discharge to: home   Newborn Data: Live born female  Birth Weight: 8 lb 4.1 oz (3745 g) APGAR: 7, 9  Home with mother.  BOOTH, ERIN 01/16/2012, 8:59 AM  I examined and assessed patient and agree with PA-S plan of care. Cheral Marker, CNM, WHNP-BC

## 2012-01-20 NOTE — Progress Notes (Signed)
I have seen and examined this patient and I agree with the above. SHAW, KIMBERLY 12:41 AM 01/20/2012    

## 2012-03-16 ENCOUNTER — Ambulatory Visit: Payer: Medicaid Other | Admitting: Emergency Medicine

## 2012-03-22 ENCOUNTER — Encounter: Payer: Self-pay | Admitting: Emergency Medicine

## 2012-03-22 ENCOUNTER — Ambulatory Visit (INDEPENDENT_AMBULATORY_CARE_PROVIDER_SITE_OTHER): Payer: Medicaid Other | Admitting: Emergency Medicine

## 2012-03-22 NOTE — Progress Notes (Signed)
  Subjective:    Patient ID: Cassandra [name], female    DOB: 29-Jul-1990, 21 y.o.   MRN: [id number]  HPI Cassandra [name] is here for a PP check.  She is doing well.  No abdominal pain or vaginal bleeding.  She is not breastfeeding and her period started today.  Has not resumed sex.  Has good support from FOB, her family and his family.  Reports good mood, no SI/HI, no trouble concentrating or problems with appetite.  Baby being seen at [facility].  Wants to get started on birth control.   I have reviewed and updated the following as appropriate: allergies and current medications SHx: never smoker  Review of Systems See HPI    Objective:   Physical Exam BP 121/80  Pulse 97  Temp 98.5 F (36.9 C)  Wt 134 lb (60.782 kg)  Breastfeeding? No Gen: alert, cooperative, NAD HEENT: At/[place], sclera whtie, MMM CV: RRR, no murmurs Pulm: CTAB, no wheezes or rales Abd: +BS, soft, NTND Ext: no edema      Assessment & Plan:

## 2012-03-22 NOTE — Assessment & Plan Note (Signed)
Doing very well.  No postpartum depression.  Good support at home.  After extensive discussion, she would like Nexplanon for birth control.  She needs some time to prepare for this and will make a follow up appointment for placement.

## 2012-04-08 ENCOUNTER — Ambulatory Visit: Payer: Medicaid Other | Admitting: Emergency Medicine

## 2012-05-17 ENCOUNTER — Encounter: Payer: Self-pay | Admitting: Family Medicine

## 2012-05-17 ENCOUNTER — Ambulatory Visit (INDEPENDENT_AMBULATORY_CARE_PROVIDER_SITE_OTHER): Payer: Self-pay | Admitting: Family Medicine

## 2012-05-17 VITALS — BP 107/71 | HR 83 | Temp 98.9°F | Wt 133.5 lb

## 2012-05-17 DIAGNOSIS — A084 Viral intestinal infection, unspecified: Secondary | ICD-10-CM

## 2012-05-17 DIAGNOSIS — A088 Other specified intestinal infections: Secondary | ICD-10-CM

## 2012-05-17 NOTE — Progress Notes (Signed)
  Subjective:    Patient ID: Cassandra [name], female    DOB: [birth year], 21 y.o.   MRN: [id number]  HPI Here for work in appt  Woke up 5-6 hours ago with nausea, vomiting, diarrhea, and mild abdominal cramping.  NO fever, cough, dysuria.  Her boyfriend and his 51 yo relative who live with her also woke up with similar illness. No known food contamination/exposure.  Works at [employer], needs note for [employer] See HPI    Objective:   Physical Exam GEN: Alert & Oriented, No acute distress, well appearing HEENT: [place]/AT. EOMI, PERRLA, no conjunctival injection or scleral icterus.  Bilateral tympanic membranes intact without erythema or effusion.  .  Nares without edema or rhinorrhea.  Oropharynx is without erythema or exudates.  No anterior or posterior cervical lymphadenopathy. CV:  Regular Rate & Rhythm, no murmur Respiratory:  Normal work of breathing, CTAB Abd:  + BS, soft, no tenderness to palpation Ext: no pre-tibial edema        Assessment & Plan:

## 2012-05-17 NOTE — Patient Instructions (Addendum)
Viral Gastroenteritis Viral gastroenteritis is also known as stomach flu. This condition affects the stomach and intestinal tract. It can cause sudden diarrhea and vomiting. The illness typically lasts 3 to 8 days. Most people develop an immune response that eventually gets rid of the virus. While this natural response develops, the virus can make you quite ill. CAUSES  Many different viruses can cause gastroenteritis, such as rotavirus or noroviruses. You can catch one of these viruses by consuming contaminated food or water. You may also catch a virus by sharing utensils or other personal items with an infected person or by touching a contaminated surface. SYMPTOMS  The most common symptoms are diarrhea and vomiting. These problems can cause a severe loss of body fluids (dehydration) and a body salt (electrolyte) imbalance. Other symptoms may include:  Fever.  Headache.  Fatigue.  Abdominal pain. DIAGNOSIS  Your caregiver can usually diagnose viral gastroenteritis based on your symptoms and a physical exam. A stool sample may also be taken to test for the presence of viruses or other infections. TREATMENT  This illness typically goes away on its own. Treatments are aimed at rehydration. The most serious cases of viral gastroenteritis involve vomiting so severely that you are not able to keep fluids down. In these cases, fluids must be given through an intravenous line (IV). HOME CARE INSTRUCTIONS   Drink enough fluids to keep your urine clear or pale yellow. Drink small amounts of fluids frequently and increase the amounts as tolerated.  Ask your caregiver for specific rehydration instructions.  Avoid:  Foods high in sugar.  Alcohol.  Carbonated drinks.  Tobacco.  Juice.  Caffeine drinks.  Extremely hot or cold fluids.  Fatty, greasy foods.  Too much intake of anything at one time.  Dairy products until 24 to 48 hours after diarrhea stops.  You may consume probiotics.  Probiotics are active cultures of beneficial bacteria. They may lessen the amount and number of diarrheal stools in adults. Probiotics can be found in yogurt with active cultures and in supplements.  Wash your hands well to avoid spreading the virus.  Only take over-the-counter or prescription medicines for pain, discomfort, or fever as directed by your caregiver. Do not give aspirin to children. Antidiarrheal medicines are not recommended.  Ask your caregiver if you should continue to take your regular prescribed and over-the-counter medicines.  Keep all follow-up appointments as directed by your caregiver. SEEK IMMEDIATE MEDICAL CARE IF:   You are unable to keep fluids down.  You do not urinate at least once every 6 to 8 hours.  You develop shortness of breath.  You notice blood in your stool or vomit. This may look like coffee grounds.  You have abdominal pain that increases or is concentrated in one small area (localized).  You have persistent vomiting or diarrhea.  You have a fever.  The patient is a child younger than 3 months, and he or she has a fever.  The patient is a child older than 3 months, and he or she has a fever and persistent symptoms.  The patient is a child older than 3 months, and he or she has a fever and symptoms suddenly get worse.  The patient is a baby, and he or she has no tears when crying. MAKE SURE YOU:   Understand these instructions.  Will watch your condition.  Will get help right away if you are not doing well or get worse. Document Released: 04/06/2005 Document Revised: 06/29/2011 Document Reviewed: 01/21/2011   ExitCare Patient Information 2013 [employer].  

## 2012-05-17 NOTE — Assessment & Plan Note (Signed)
Discussed supportive care, expected course.  Written out of work for 2 days, advised may need to extend if continuing to have diarrhea/emesis past this time as today is day 1 of illness.  Discussed red flags to seek urgent medical attention

## 2012-11-14 ENCOUNTER — Ambulatory Visit (INDEPENDENT_AMBULATORY_CARE_PROVIDER_SITE_OTHER): Payer: Self-pay | Admitting: *Deleted

## 2012-11-14 DIAGNOSIS — Z111 Encounter for screening for respiratory tuberculosis: Secondary | ICD-10-CM

## 2012-11-14 NOTE — Progress Notes (Signed)
Patient here today for PPD for work. PPD skin test applied to left ventral forearm. Patient will return in 48-72 hours for nurse visit to have site read.  Gaylene Brooks, RN

## 2012-11-17 ENCOUNTER — Ambulatory Visit: Payer: Self-pay | Admitting: *Deleted

## 2012-11-17 ENCOUNTER — Encounter: Payer: Self-pay | Admitting: *Deleted

## 2012-11-17 DIAGNOSIS — Z111 Encounter for screening for respiratory tuberculosis: Secondary | ICD-10-CM

## 2012-11-17 NOTE — Progress Notes (Signed)
PPD Reading Note PPD read and results entered in EpicCare. Result: 0 mm induration. Interpretation: negative Letter given regarding results.

## 2013-03-24 ENCOUNTER — Inpatient Hospital Stay (HOSPITAL_COMMUNITY)
Admission: AD | Admit: 2013-03-24 | Discharge: 2013-03-24 | Disposition: A | Discharge status: Home / Self Care | Payer: Medicaid Other | Source: Ambulatory Visit | Attending: Family Medicine | Admitting: Family Medicine

## 2013-03-24 ENCOUNTER — Encounter (HOSPITAL_COMMUNITY): Payer: Self-pay | Admitting: *Deleted

## 2013-03-24 DIAGNOSIS — O210 Mild hyperemesis gravidarum: Secondary | ICD-10-CM | POA: Insufficient documentation

## 2013-03-24 DIAGNOSIS — O26899 Other specified pregnancy related conditions, unspecified trimester: Secondary | ICD-10-CM

## 2013-03-24 DIAGNOSIS — N949 Unspecified condition associated with female genital organs and menstrual cycle: Secondary | ICD-10-CM | POA: Insufficient documentation

## 2013-03-24 DIAGNOSIS — R109 Unspecified abdominal pain: Secondary | ICD-10-CM | POA: Insufficient documentation

## 2013-03-24 DIAGNOSIS — O99891 Other specified diseases and conditions complicating pregnancy: Secondary | ICD-10-CM | POA: Insufficient documentation

## 2013-03-24 DIAGNOSIS — O9989 Other specified diseases and conditions complicating pregnancy, childbirth and the puerperium: Secondary | ICD-10-CM

## 2013-03-24 LAB — WET PREP, GENITAL: Yeast Wet Prep HPF POC: NONE SEEN

## 2013-03-24 LAB — URINALYSIS, ROUTINE W REFLEX MICROSCOPIC
Bilirubin Urine: NEGATIVE
Glucose, UA: NEGATIVE mg/dL
Hgb urine dipstick: NEGATIVE
Specific Gravity, Urine: 1.025 (ref 1.005–1.030)
pH: 7.5 (ref 5.0–8.0)

## 2013-03-24 LAB — POCT PREGNANCY, URINE: Preg Test, Ur: POSITIVE — AB

## 2013-03-24 LAB — URINE MICROSCOPIC-ADD ON

## 2013-03-24 MED ORDER — PROMETHAZINE HCL 12.5 MG PO TABS: 12.5000 mg | ORAL_TABLET | Freq: Four times a day (QID) | ORAL | Status: DC | PRN

## 2013-03-24 NOTE — MAU Provider Note (Signed)
Chart reviewed and agree with management and plan.  

## 2013-03-24 NOTE — MAU Provider Note (Signed)
History     CSN: [id number]  Arrival date and time: 03/24/13 1049   First Provider Initiated Contact with Patient 03/24/13 1206      Chief Complaint  Patient presents with  . Abdominal Pain  . Possible Pregnancy  . Emesis   HPI  Cassandra [name] is a 22 y.o. G2P1001 at [redacted]w[redacted]d who presents with right sided pain. She states that the pain is from the hip bone up to the breasts. She denies any vaginal bleeding or vaginal discharge. She has also had nausea and vomiting. She states that she is vomiting once per day. She is not currently nauseous. Her LMP was at the end of September (01/09/13). She is planning on going to [facility] for prenatal care. She went there with her last pregnancy.   Past Medical History  Diagnosis Date  . No pertinent past medical history     Past Surgical History  Procedure Laterality Date  . Wisdom teeth  2009    Family History  Problem Relation Age of Onset  . Asthma Mother   . Diabetes Mother   . Asthma Sister   . Asthma Brother   . Cancer Maternal Uncle   . Hypertension Paternal Grandfather     History  Substance Use Topics  . Smoking status: Never Smoker   . Smokeless tobacco: Never Used  . Alcohol Use: No    Allergies: No Known Allergies  Prescriptions prior to admission  Medication Sig Dispense Refill  . Prenatal Vit-Fe Fumarate-FA (PRENATAL MULTIVITAMIN) TABS tablet Take 1 tablet by mouth daily at 12 noon.        ROS Physical Exam   Blood pressure 117/70, pulse 90, temperature 98.4 F (36.9 C), temperature source Oral, resp. rate 16, height 5' 4.5" (1.638 m), weight 65.046 kg (143 lb 6.4 oz), last menstrual period 01/09/2013, SpO2 100.00%.  Physical Exam  Nursing note and vitals reviewed. Constitutional: She is oriented to person, place, and time. She appears well-developed and well-nourished. No distress.  Cardiovascular: Normal rate.   Respiratory: Effort normal.  GI: Soft. There is no tenderness.  Genitourinary:    External: no lesion Vagina: small amount of white discharge Cervix: pink, smooth, closed/thick  Uterus: 16-18 weeks size, FHT 145 with doppler     Neurological: She is alert and oriented to person, place, and time.  Skin: Skin is warm and dry.  Psychiatric: She has a normal mood and affect.    MAU Course  Procedures  Results for orders placed during the hospital encounter of 03/24/13 (from the past 24 hour(s))  URINALYSIS, ROUTINE W REFLEX MICROSCOPIC     Status: Abnormal   Collection Time    03/24/13 11:20 AM      Result Value Range   Color, Urine YELLOW  YELLOW   APPearance HAZY (*) CLEAR   Specific Gravity, Urine 1.025  1.005 - 1.030   pH 7.5  5.0 - 8.0   Glucose, UA NEGATIVE  NEGATIVE mg/dL   Hgb urine dipstick NEGATIVE  NEGATIVE   Bilirubin Urine NEGATIVE  NEGATIVE   Ketones, ur NEGATIVE  NEGATIVE mg/dL   Protein, ur NEGATIVE  NEGATIVE mg/dL   Urobilinogen, UA 1.0  0.0 - 1.0 mg/dL   Nitrite NEGATIVE  NEGATIVE   Leukocytes, UA SMALL (*) NEGATIVE  URINE MICROSCOPIC-ADD ON     Status: Abnormal   Collection Time    03/24/13 11:20 AM      Result Value Range   Squamous Epithelial / LPF FEW (*) RARE  WBC, UA 0-2  <3 WBC/hpf   Bacteria, UA FEW (*) RARE   Urine-Other MUCOUS PRESENT    POCT PREGNANCY, URINE     Status: Abnormal   Collection Time    03/24/13 11:51 AM      Result Value Range   Preg Test, Ur POSITIVE (*) NEGATIVE  WET PREP, GENITAL     Status: Abnormal   Collection Time    03/24/13 12:32 PM      Result Value Range   Yeast Wet Prep HPF POC NONE SEEN  NONE SEEN   Trich, Wet Prep NONE SEEN  NONE SEEN   Clue Cells Wet Prep HPF POC FEW (*) NONE SEEN   WBC, Wet Prep HPF POC MANY (*) NONE SEEN     Assessment and Plan   1. Pain of round ligament complicating pregnancy, antepartum    Dating/anatomy ultrasound to be done outpatient [facility] as soon as possible    Medication List         prenatal multivitamin Tabs tablet  Take 1 tablet by mouth  daily at 12 noon.     promethazine 12.5 MG tablet  Commonly known as:  PHENERGAN  Take 1 tablet (12.5 mg total) by mouth every 6 (six) hours as needed for nausea or vomiting.         Follow-up Information   Schedule an appointment as soon as possible for a visit with [facility].   Specialty:  Family Medicine   Contact information:   [address] [id number] [place] [address] [phone]       Tawnya Crook 03/24/2013, 12:23 PM

## 2013-03-24 NOTE — MAU Note (Signed)
Patient state she has been having lower abdominal pain on and off for about one month. Has had vomiting off and on for about 2 weeks. Denies bleeding or vaginal discharge.

## 2013-03-31 ENCOUNTER — Ambulatory Visit (HOSPITAL_COMMUNITY)
Admission: RE | Admit: 2013-03-31 | Discharge: 2013-03-31 | Disposition: A | Discharge status: Home / Self Care | Payer: Medicaid Other | Source: Ambulatory Visit | Attending: Advanced Practice Midwife | Admitting: Advanced Practice Midwife

## 2013-03-31 DIAGNOSIS — Z3689 Encounter for other specified antenatal screening: Secondary | ICD-10-CM | POA: Insufficient documentation

## 2013-03-31 DIAGNOSIS — O3660X0 Maternal care for excessive fetal growth, unspecified trimester, not applicable or unspecified: Secondary | ICD-10-CM | POA: Insufficient documentation

## 2013-03-31 DIAGNOSIS — O26899 Other specified pregnancy related conditions, unspecified trimester: Secondary | ICD-10-CM

## 2013-04-19 ENCOUNTER — Other Ambulatory Visit: Payer: Self-pay

## 2013-04-20 NOTE — L&D Delivery Note (Signed)
Delivery Note At 12:33 PM a viable female was delivered via Vaginal, Spontaneous Delivery (Presentation: ; Occiput Posterior).  APGAR:Pending, crying at pernieum weight .   Placenta status: Intact, Spontaneous.  Cord: 3 vessels with the following complications: None.  Cord pH: not sent  Anesthesia: None  Episiotomy: None Lacerations: 1st degree Suture Repair: 3.0 monocryl in usual fashion Est. Blood Loss (mL): 300  Mom to postpartum.  Baby to Couplet care / Skin to Skin.  Preciptious NSVD over 1st degree tear. Active mangment of 3rd stage with pit IM and traction. INtact placenta w/ 3v cord. 1st degree repaired in usual fashion.  EBL 300. Hemostatic, counts correct. Pt was not treated for GBS  Sully Manzi RYAN 05/27/2013, 12:50 PM

## 2013-04-26 ENCOUNTER — Encounter: Payer: Self-pay | Admitting: Family Medicine

## 2013-05-15 ENCOUNTER — Encounter (HOSPITAL_COMMUNITY): Payer: Self-pay

## 2013-05-15 ENCOUNTER — Inpatient Hospital Stay (HOSPITAL_COMMUNITY)
Admission: AD | Admit: 2013-05-15 | Discharge: 2013-05-15 | Disposition: A | Discharge status: Home / Self Care | Payer: Medicaid Other | Source: Ambulatory Visit | Attending: Obstetrics & Gynecology | Admitting: Obstetrics & Gynecology

## 2013-05-15 DIAGNOSIS — O99820 Streptococcus B carrier state complicating pregnancy: Secondary | ICD-10-CM

## 2013-05-15 DIAGNOSIS — O99891 Other specified diseases and conditions complicating pregnancy: Secondary | ICD-10-CM | POA: Insufficient documentation

## 2013-05-15 DIAGNOSIS — O9989 Other specified diseases and conditions complicating pregnancy, childbirth and the puerperium: Principal | ICD-10-CM

## 2013-05-15 DIAGNOSIS — R1031 Right lower quadrant pain: Secondary | ICD-10-CM | POA: Insufficient documentation

## 2013-05-15 LAB — URINALYSIS, ROUTINE W REFLEX MICROSCOPIC
BILIRUBIN URINE: NEGATIVE
Glucose, UA: NEGATIVE mg/dL
KETONES UR: NEGATIVE mg/dL
NITRITE: NEGATIVE
PH: 6 (ref 5.0–8.0)
Protein, ur: NEGATIVE mg/dL
Specific Gravity, Urine: 1.025 (ref 1.005–1.030)
Urobilinogen, UA: 2 mg/dL — ABNORMAL HIGH (ref 0.0–1.0)

## 2013-05-15 LAB — OB RESULTS CONSOLE GBS: STREP GROUP B AG: POSITIVE

## 2013-05-15 LAB — URINE MICROSCOPIC-ADD ON

## 2013-05-15 NOTE — Discharge Instructions (Signed)
Keep your scheduled appointment for prenatal care. The clinic may call you with a sooner appointment. Drink 8-10 glasses of water per day.

## 2013-05-15 NOTE — MAU Note (Signed)
Patient states she has been having right lower abdominal pain for about 2 weeks off and on. Denies bleeding, leaking or contractions. Reports good fetal movement. Denies S/S flu.

## 2013-05-16 LAB — URINE CULTURE: Colony Count: 3000

## 2013-05-16 LAB — CULTURE, BETA STREP (GROUP B ONLY)

## 2013-05-19 DIAGNOSIS — O99820 Streptococcus B carrier state complicating pregnancy: Secondary | ICD-10-CM

## 2013-05-27 ENCOUNTER — Inpatient Hospital Stay (HOSPITAL_COMMUNITY)
Admission: AD | Admit: 2013-05-27 | Discharge: 2013-05-29 | DRG: 775 | Disposition: A | Discharge status: Home / Self Care | Payer: Medicaid Other | Source: Ambulatory Visit | Attending: Obstetrics & Gynecology | Admitting: Obstetrics & Gynecology

## 2013-05-27 ENCOUNTER — Encounter (HOSPITAL_COMMUNITY): Payer: Self-pay | Admitting: *Deleted

## 2013-05-27 DIAGNOSIS — O9989 Other specified diseases and conditions complicating pregnancy, childbirth and the puerperium: Secondary | ICD-10-CM

## 2013-05-27 DIAGNOSIS — O99892 Other specified diseases and conditions complicating childbirth: Secondary | ICD-10-CM

## 2013-05-27 DIAGNOSIS — O99820 Streptococcus B carrier state complicating pregnancy: Secondary | ICD-10-CM

## 2013-05-27 DIAGNOSIS — O0930 Supervision of pregnancy with insufficient antenatal care, unspecified trimester: Secondary | ICD-10-CM

## 2013-05-27 DIAGNOSIS — Z22330 Carrier of Group B streptococcus: Secondary | ICD-10-CM

## 2013-05-27 DIAGNOSIS — IMO0001 Reserved for inherently not codable concepts without codable children: Secondary | ICD-10-CM

## 2013-05-27 LAB — RAPID URINE DRUG SCREEN, HOSP PERFORMED
Amphetamines: NOT DETECTED
Barbiturates: NOT DETECTED
Benzodiazepines: NOT DETECTED
Cocaine: NOT DETECTED
OPIATES: NOT DETECTED
Tetrahydrocannabinol: NOT DETECTED

## 2013-05-27 LAB — DIFFERENTIAL
Basophils Absolute: 0 10*3/uL (ref 0.0–0.1)
Basophils Relative: 0 % (ref 0–1)
EOS PCT: 0 % (ref 0–5)
Eosinophils Absolute: 0 10*3/uL (ref 0.0–0.7)
LYMPHS PCT: 10 % — AB (ref 12–46)
Lymphs Abs: 1 10*3/uL (ref 0.7–4.0)
Monocytes Absolute: 0.5 10*3/uL (ref 0.1–1.0)
Monocytes Relative: 5 % (ref 3–12)
Neutro Abs: 8.3 10*3/uL — ABNORMAL HIGH (ref 1.7–7.7)
Neutrophils Relative %: 85 % — ABNORMAL HIGH (ref 43–77)

## 2013-05-27 LAB — CBC
HCT: 29.7 % — ABNORMAL LOW (ref 36.0–46.0)
Hemoglobin: 9.3 g/dL — ABNORMAL LOW (ref 12.0–15.0)
MCH: 25.3 pg — ABNORMAL LOW (ref 26.0–34.0)
MCHC: 31.3 g/dL (ref 30.0–36.0)
MCV: 80.7 fL (ref 78.0–100.0)
PLATELETS: 204 10*3/uL (ref 150–400)
RBC: 3.68 MIL/uL — ABNORMAL LOW (ref 3.87–5.11)
RDW: 15.6 % — AB (ref 11.5–15.5)
WBC: 9.8 10*3/uL (ref 4.0–10.5)

## 2013-05-27 LAB — TYPE AND SCREEN
ABO/RH(D): A POS
Antibody Screen: NEGATIVE

## 2013-05-27 LAB — RAPID HIV SCREEN (WH-MAU): Rapid HIV Screen: NONREACTIVE

## 2013-05-27 LAB — RPR: RPR Ser Ql: NONREACTIVE

## 2013-05-27 LAB — ABO/RH: ABO/RH(D): A POS

## 2013-05-27 MED ORDER — LACTATED RINGERS IV SOLN: 500.0000 mL | INTRAVENOUS | Status: DC | PRN

## 2013-05-27 MED ORDER — HEPATITIS B VAC RECOMBINANT 10 MCG/0.5ML IJ SUSP
0.5000 mL | Freq: Once | INTRAMUSCULAR | Status: DC
  Filled 2013-05-27: qty 0.5

## 2013-05-27 MED ORDER — SUCROSE 24% NICU/PEDS ORAL SOLUTION
0.5000 mL | OROMUCOSAL | Status: DC | PRN
  Filled 2013-05-27: qty 0.5

## 2013-05-27 MED ORDER — ONDANSETRON HCL 4 MG/2ML IJ SOLN: 4.0000 mg | INTRAMUSCULAR | Status: DC | PRN

## 2013-05-27 MED ORDER — BENZOCAINE-MENTHOL 20-0.5 % EX AERO: 1.0000 "application " | INHALATION_SPRAY | CUTANEOUS | Status: DC | PRN

## 2013-05-27 MED ORDER — IBUPROFEN 600 MG PO TABS: 600.0000 mg | ORAL_TABLET | Freq: Four times a day (QID) | ORAL | Status: DC | PRN

## 2013-05-27 MED ORDER — ZOLPIDEM TARTRATE 5 MG PO TABS: 5.0000 mg | ORAL_TABLET | Freq: Every evening | ORAL | Status: DC | PRN

## 2013-05-27 MED ORDER — LANOLIN HYDROUS EX OINT: TOPICAL_OINTMENT | CUTANEOUS | Status: DC | PRN

## 2013-05-27 MED ORDER — TETANUS-DIPHTH-ACELL PERTUSSIS 5-2.5-18.5 LF-MCG/0.5 IM SUSP: 0.5000 mL | Freq: Once | INTRAMUSCULAR | Status: DC

## 2013-05-27 MED ORDER — CITRIC ACID-SODIUM CITRATE 334-500 MG/5ML PO SOLN: 30.0000 mL | ORAL | Status: DC | PRN

## 2013-05-27 MED ORDER — DIBUCAINE 1 % RE OINT: 1.0000 "application " | TOPICAL_OINTMENT | RECTAL | Status: DC | PRN

## 2013-05-27 MED ORDER — IBUPROFEN 600 MG PO TABS
600.0000 mg | ORAL_TABLET | Freq: Four times a day (QID) | ORAL | Status: DC
  Administered 2013-05-27 – 2013-05-29 (×7): 600 mg via ORAL
  Filled 2013-05-27 (×8): qty 1

## 2013-05-27 MED ORDER — OXYCODONE-ACETAMINOPHEN 5-325 MG PO TABS: 1.0000 | ORAL_TABLET | ORAL | Status: DC | PRN

## 2013-05-27 MED ORDER — VITAMIN K1 1 MG/0.5ML IJ SOLN
1.0000 mg | Freq: Once | INTRAMUSCULAR | Status: DC
  Filled 2013-05-27: qty 0.5

## 2013-05-27 MED ORDER — WITCH HAZEL-GLYCERIN EX PADS: 1.0000 "application " | MEDICATED_PAD | CUTANEOUS | Status: DC | PRN

## 2013-05-27 MED ORDER — OXYTOCIN BOLUS FROM INFUSION: 500.0000 mL | INTRAVENOUS | Status: DC

## 2013-05-27 MED ORDER — LIDOCAINE HCL (PF) 1 % IJ SOLN
30.0000 mL | INTRAMUSCULAR | Status: AC | PRN
  Administered 2013-05-27: 30 mL via SUBCUTANEOUS
  Filled 2013-05-27 (×2): qty 30

## 2013-05-27 MED ORDER — OXYTOCIN 40 UNITS IN LACTATED RINGERS INFUSION - SIMPLE MED: 62.5000 mL/h | INTRAVENOUS | Status: DC

## 2013-05-27 MED ORDER — LACTATED RINGERS IV SOLN: INTRAVENOUS | Status: DC

## 2013-05-27 MED ORDER — ONDANSETRON HCL 4 MG PO TABS: 4.0000 mg | ORAL_TABLET | ORAL | Status: DC | PRN

## 2013-05-27 MED ORDER — DIPHENHYDRAMINE HCL 25 MG PO CAPS: 25.0000 mg | ORAL_CAPSULE | Freq: Four times a day (QID) | ORAL | Status: DC | PRN

## 2013-05-27 MED ORDER — ONDANSETRON HCL 4 MG/2ML IJ SOLN: 4.0000 mg | Freq: Four times a day (QID) | INTRAMUSCULAR | Status: DC | PRN

## 2013-05-27 MED ORDER — INFLUENZA VAC SPLIT QUAD 0.5 ML IM SUSP
0.5000 mL | INTRAMUSCULAR | Status: AC
Start: 2013-05-28 — End: 2013-05-28
  Administered 2013-05-28: 0.5 mL via INTRAMUSCULAR

## 2013-05-27 MED ORDER — SENNOSIDES-DOCUSATE SODIUM 8.6-50 MG PO TABS
2.0000 | ORAL_TABLET | ORAL | Status: DC
  Administered 2013-05-28 – 2013-05-29 (×2): 2 via ORAL
  Filled 2013-05-27: qty 1
  Filled 2013-05-27 (×2): qty 2

## 2013-05-27 MED ORDER — SIMETHICONE 80 MG PO CHEW: 80.0000 mg | CHEWABLE_TABLET | ORAL | Status: DC | PRN

## 2013-05-27 MED ORDER — ERYTHROMYCIN 5 MG/GM OP OINT: 1.0000 "application " | TOPICAL_OINTMENT | Freq: Once | OPHTHALMIC | Status: DC

## 2013-05-27 MED ORDER — PRENATAL MULTIVITAMIN CH
1.0000 | ORAL_TABLET | Freq: Every day | ORAL | Status: DC
  Administered 2013-05-28 – 2013-05-29 (×2): 1 via ORAL
  Filled 2013-05-27 (×2): qty 1

## 2013-05-27 MED ORDER — ACETAMINOPHEN 325 MG PO TABS: 650.0000 mg | ORAL_TABLET | ORAL | Status: DC | PRN

## 2013-05-27 MED ORDER — OXYTOCIN 10 UNIT/ML IJ SOLN
INTRAMUSCULAR | Status: AC
  Administered 2013-05-27: 10 [IU]
  Filled 2013-05-27: qty 1

## 2013-05-27 NOTE — Lactation Note (Signed)
This note was copied from the chart of Cassandra [name]. Lactation Consultation Note  Patient Name: Cassandra Ilka Lovick [id number] Date: 05/27/2013 Reason for consult: Initial assessment Per RN, Mom has decided to bottle and formula feed.   Maternal Data    Feeding Feeding Type: Bottle Fed - Formula Nipple Type: Regular  LATCH Score/Interventions                      Lactation Tools Discussed/Used     Consult Status Consult Status: Complete    Katrine Coho 05/27/2013, 11:32 PM

## 2013-05-27 NOTE — H&P (Signed)
Cassandra [name] is a 23 y.o. female G2P1001 with IUP at [redacted]w[redacted]d presenting for active labor. Pt precipitously delivered in L&D. Pt had painful contractions and rupture enroute to L&D. Pt found to be complete and pushed rapidly. Pt was GBS+ without prenatal care and did not recieve antibiotics.  Prenatal History/Complications: GBS+ Infant Cassandra [name] showed kidney in pelvis on right. Will inform peds.  Past Medical History: Past Medical History  Diagnosis Date  . No pertinent past medical history     Past Surgical History: Past Surgical History  Procedure Laterality Date  . Wisdom teeth  2009    Obstetrical History: OB History   Grav Para Term Preterm Abortions TAB SAB Ect Mult Living   2 1 1  0 0 0 0 0 0 1      Gynecological History: OB History   Grav Para Term Preterm Abortions TAB SAB Ect Mult Living   2 1 1  0 0 0 0 0 0 1      Social History: History   Social History  . Marital Status: Single    Spouse Name: N/A    Number of Children: N/A  . Years of Education: N/A   Social History Main Topics  . Smoking status: Never Smoker   . Smokeless tobacco: Never Used  . Alcohol Use: No  . Drug Use: No  . Sexual Activity: Yes    Birth Control/ Protection: None   Other Topics Concern  . None   Social History Narrative  . None    Family History: Family History  Problem Relation Age of Onset  . Asthma Mother   . Diabetes Mother   . Asthma Sister   . Asthma Brother   . Cancer Maternal Uncle   . Hypertension Paternal Grandfather     Allergies: No Known Allergies  Prescriptions prior to admission  Medication Sig Dispense Refill  . Prenatal Vit-Fe Fumarate-FA (PRENATAL MULTIVITAMIN) TABS tablet Take 1 tablet by mouth daily at 12 noon.      . promethazine (PHENERGAN) 12.5 MG tablet Take 1 tablet (12.5 mg total) by mouth every 6 (six) hours as needed for nausea or vomiting.  30 tablet  0     Review of Systems   Constitutional: No acute issues  Blood pressure  114/57, pulse 93, height 5\' 5"  (1.651 m), weight 63.504 kg (140 lb), last menstrual period 01/09/2013, SpO2 88.00%. General appearance: alert, cooperative and appears stated age Lungs: clear to auscultation bilaterally Heart: regular rate and rhythm Abdomen: soft, non-tender; bowel sounds normal Dilation: 10 Effacement (%): 100 Station: +2 Exam by:: sowder   Prenatal labs: ABO, Rh:   Antibody:   Rubella:   RPR:    HBsAg:    HIV:    GBS: Positive (01/26 0000)  No prenatal labs  Prenatal Transfer Tool  Maternal Diabetes: Did not collect Genetic Screening: Did not receive prenatal care Maternal Ultrasounds/Referrals: Abnormal:  Findings:   Fetal Kidney Anomalies Fetal Ultrasounds or other Referrals:  None Maternal Substance Abuse:  UDS Pending Significant Maternal Medications:  None Significant Maternal Lab Results: Lab values include: Group B Strep positive that was not treated.     No results found for this or any previous visit (from the past 24 hour(s)).  Assessment: Cassandra [name] is a 23 y.o. G2P1001 at [redacted]w[redacted]d by 75 wk Cassandra [name] w/ precipitous delivery GBS+ not treated Fetus with right kidney in pelvis UDS collected given no prenatal care To Postpartum Breast feeding and desires Nexplanon for contraception.  Cassandra [name],  Cassandra [name] 05/27/2013, 12:59 PM

## 2013-05-28 LAB — HEPATITIS B SURFACE ANTIGEN: Hepatitis B Surface Ag: NEGATIVE

## 2013-05-28 MED ORDER — MEDROXYPROGESTERONE ACETATE 150 MG/ML IM SUSP
150.0000 mg | INTRAMUSCULAR | Status: DC
  Administered 2013-05-29: 150 mg via INTRAMUSCULAR
  Filled 2013-05-28: qty 1

## 2013-05-28 MED ORDER — FERROUS SULFATE 325 (65 FE) MG PO TABS
325.0000 mg | ORAL_TABLET | Freq: Every day | ORAL | Status: DC
  Administered 2013-05-28 – 2013-05-29 (×2): 325 mg via ORAL
  Filled 2013-05-28 (×2): qty 1

## 2013-05-28 NOTE — Progress Notes (Signed)
Clinical Social Work Department PSYCHOSOCIAL ASSESSMENT - MATERNAL/CHILD 05/28/2013  Patient:  Cassandra [name], Cassandra [name]  Account Number:  [id number]  Admit Date:  05/27/2013  Cassandra [name]    Clinical Social Worker:  Thania Woodlief, LCSW   Date/Time:  05/28/2013 10:30 AM  Date Referred:  05/28/2013   Referral source  Central Nursery     Referred reason  No Prenatal Care   Other referral source:    I:  FAMILY / HOME ENVIRONMENT Child's legal guardian:  PARENT  Guardian - Name Guardian - Age Guardian - Address  Cassandra [name], Cassandra [name] 22 [address] [place], [place]  Cassandra [name] 21 same as above   Other household support members/support persons Other support:    II  PSYCHOSOCIAL DATA Information Source:    Occupational hygienist Employment:   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  Paternal grandmother was also present. She and newborn's father cohabitate.  They are currently residing with paternal grandmother, and have one other dependent age 26.  Mother worked during pregnancy.   Father of baby is currently unemployed and looking for employment. Mother states that she had no prenatal care because of insurance.  Informed that she did not become aware of the pregnancy until October.  She applied for Medicaid and states that it took three months for her to be approved. Once approved, she could not get an apt. right away and she  delivered before the apt.    She denies hx of mental illness or substance abuse.  UDS on newborn was negative. Mother was informed of the hospital's drug screening policy.   Mother reports adequate family support.  No acute social concerns noted or reported at this time.  Mother informed of social work Fish farm manager.      VI SOCIAL WORK PLAN Social Work Plan  No Barriers to Discharge   Type of pt/family education:   If child protective services report - county:   If child protective services report - date:   Information/referral to community resources comment:   Other social work plan:   Will continue to monitor drug screen.

## 2013-05-28 NOTE — Progress Notes (Signed)
Post Partum Day 1  Subjective: no complaints, up ad lib, voiding and tolerating PO.  Pt did not receive prenatal care.  No reason given for not obtaining.  Desires depo > nexplanon for family planning.    Objective: Blood pressure 96/58, pulse 69, temperature 98.8 F (37.1 C), temperature source Oral, resp. rate 16, height 5\' 5"  (1.651 m), weight 63.504 kg (140 lb), last menstrual period 01/09/2013, SpO2 100.00%, unknown if currently breastfeeding.  Physical Exam:  General: alert, cooperative and appears stated age Lochia: appropriate Uterine Fundus: firm Incision: n/a DVT Evaluation: No evidence of DVT seen on physical exam. Negative Homan's sign.  Recent Labs  05/27/13 1305  HGB 9.3*  HCT 29.7*   Results for orders placed during the hospital encounter of 05/27/13 (from the past 24 hour(s))  CBC     Status: Abnormal   Collection Time    05/27/13  1:05 PM      Result Value Range   WBC 9.8  4.0 - 10.5 K/uL   RBC 3.68 (*) 3.87 - 5.11 MIL/uL   Hemoglobin 9.3 (*) 12.0 - 15.0 g/dL   HCT 29.7 (*) 36.0 - 46.0 %   MCV 80.7  78.0 - 100.0 fL   MCH 25.3 (*) 26.0 - 34.0 pg   MCHC 31.3  30.0 - 36.0 g/dL   RDW 15.6 (*) 11.5 - 15.5 %   Platelets 204  150 - 400 K/uL  RPR     Status: None   Collection Time    05/27/13  1:05 PM      Result Value Range   RPR NON REACTIVE  NON REACTIVE  RAPID HIV SCREEN ([facility])     Status: None   Collection Time    05/27/13  1:05 PM      Result Value Range   SUDS Rapid HIV Screen NON REACTIVE  NON REACTIVE  HEPATITIS B SURFACE ANTIGEN     Status: None   Collection Time    05/27/13  1:05 PM      Result Value Range   Hepatitis B Surface Ag NEGATIVE  NEGATIVE  DIFFERENTIAL     Status: Abnormal   Collection Time    05/27/13  1:05 PM      Result Value Range   Neutrophils Relative % 85 (*) 43 - 77 %   Neutro Abs 8.3 (*) 1.7 - 7.7 K/uL   Lymphocytes Relative 10 (*) 12 - 46 %   Lymphs Abs 1.0  0.7 - 4.0 K/uL   Monocytes Relative 5  3 - 12 %   Monocytes Absolute 0.5  0.1 - 1.0 K/uL   Eosinophils Relative 0  0 - 5 %   Eosinophils Absolute 0.0  0.0 - 0.7 K/uL   Basophils Relative 0  0 - 1 %   Basophils Absolute 0.0  0.0 - 0.1 K/uL  TYPE AND SCREEN     Status: None   Collection Time    05/27/13  1:05 PM      Result Value Range   ABO/RH(D) A POS     Antibody Screen NEG     Sample Expiration 05/30/2013    ABO/RH     Status: None   Collection Time    05/27/13  1:05 PM      Result Value Range   ABO/RH(D) A POS    URINE RAPID DRUG SCREEN (HOSP PERFORMED)     Status: None   Collection Time    05/27/13  3:18 PM  Result Value Range   Opiates NONE DETECTED  NONE DETECTED   Cocaine NONE DETECTED  NONE DETECTED   Benzodiazepines NONE DETECTED  NONE DETECTED   Amphetamines NONE DETECTED  NONE DETECTED   Tetrahydrocannabinol NONE DETECTED  NONE DETECTED   Barbiturates NONE DETECTED  NONE DETECTED    Assessment/Plan: Plan for discharge tomorrow Depo provera prior to discharge  LOS: 1 day   [facility] 05/28/2013, 7:22 AM

## 2013-05-29 MED ORDER — IBUPROFEN 600 MG PO TABS: 600.0000 mg | ORAL_TABLET | Freq: Four times a day (QID) | ORAL | Status: DC

## 2013-05-29 NOTE — Progress Notes (Signed)
UR chart review completed.  

## 2013-05-29 NOTE — Discharge Instructions (Signed)

## 2013-05-30 LAB — RUBELLA SCREEN: RUBELLA: 3.77 {index} — AB (ref <0.90)

## 2013-06-01 ENCOUNTER — Encounter: Payer: Self-pay | Admitting: Advanced Practice Midwife

## 2013-06-01 ENCOUNTER — Encounter: Payer: Self-pay | Admitting: Family Medicine

## 2013-06-29 ENCOUNTER — Ambulatory Visit: Payer: Self-pay | Admitting: Advanced Practice Midwife

## 2013-06-29 ENCOUNTER — Telehealth: Payer: Self-pay | Admitting: General Practice

## 2013-06-29 NOTE — Telephone Encounter (Signed)
Called patient stating I see she missed her pp appt with [place] today. Patient stated oh I'm sorry I forgot and want that rescheduled please. Told patient I would have our front office staff contact her with a new appt and asked what she would like to do for birth control. Patient stated she had got the depo shot recently but would like to do the nexplanon. Told patient we could do that at that appt as long as she had active insurance. Patient verbalized understanding and had no further questions

## 2013-07-17 NOTE — Lactation Note (Signed)
Obstetric Discharge Summary  Reason for Admission: precipitous delivery in MAU  Prenatal Procedures: None  Intrapartum Procedures: spontaneous vaginal delivery  Postpartum Procedures: none  Complications-Operative and Postpartum: none    Hemoglobin    Date  Value  Ref Range  Status    05/27/2013  9.3*  12.0 - 15.0 g/dL  Final       HCT    Date  Value  Ref Range  Status    05/27/2013  29.7*  36.0 - 46.0 %  Final     Precipitously delivered in MAU.     Delivery Note  At 12:33 PM a viable female was delivered via Vaginal, Spontaneous Delivery (Presentation: ; Occiput Posterior). APGAR:Pending, crying at pernieum weight .  Placenta status: Intact, Spontaneous. Cord: 3 vessels with the following complications: None. Cord pH: not sent  Anesthesia: None  Episiotomy: None  Lacerations: 1st degree  Suture Repair: 3.0 monocryl in usual fashion  Est. Blood Loss (mL): 300  Mom to postpartum. Baby to Couplet care / Skin to Skin.  Preciptious NSVD over 1st degree tear. Active mangment of 3rd stage with pit IM and traction. INtact placenta w/ 3v cord. 1st degree repaired in usual fashion. EBL 300. Hemostatic, counts correct. Pt was not treated for GBS  Yalena Colon RYAN  05/27/2013, 12:50 PM       Pt precipitously delivered. Had no PP complications and was discharged after receiving depo. Pt to follow up in clinic.  Discharge Diagnoses: Term Pregnancy-delivered  Discharge Information:  Date: 07/17/2013  Activity: pelvic rest  Diet: routine  Depo prior to discharge  Condition: stable  Instructions: refer to practice specific booklet  Discharge to: home     Follow-up Information      Follow up with [facility]. Schedule an appointment as soon as possible for a visit in 4 weeks.      Contact information:      [address]   [place] [address]          Newborn Data:  Live born female  Birth Weight: 6 lb 12.8 oz (3084 g)  APGAR: 9, 9  Home with mother.  Jayne Peckenpaugh RYAN   07/17/2013, 1:59 PM        

## 2013-07-19 ENCOUNTER — Ambulatory Visit: Payer: Medicaid Other | Admitting: Emergency Medicine

## 2013-07-26 ENCOUNTER — Ambulatory Visit (INDEPENDENT_AMBULATORY_CARE_PROVIDER_SITE_OTHER): Payer: Medicaid Other | Admitting: Emergency Medicine

## 2013-07-26 ENCOUNTER — Encounter: Payer: Self-pay | Admitting: Emergency Medicine

## 2013-07-26 DIAGNOSIS — Z30017 Encounter for initial prescription of implantable subdermal contraceptive: Secondary | ICD-10-CM

## 2013-07-26 DIAGNOSIS — Z3046 Encounter for surveillance of implantable subdermal contraceptive: Secondary | ICD-10-CM

## 2013-07-26 DIAGNOSIS — Z309 Encounter for contraceptive management, unspecified: Secondary | ICD-10-CM

## 2013-07-26 LAB — CBC
HEMATOCRIT: 33.1 % — AB (ref 36.0–46.0)
HEMOGLOBIN: 10.9 g/dL — AB (ref 12.0–15.0)
MCH: 27.3 pg (ref 26.0–34.0)
MCHC: 32.9 g/dL (ref 30.0–36.0)
MCV: 82.8 fL (ref 78.0–100.0)
PLATELETS: 299 10*3/uL (ref 150–400)
RBC: 4 MIL/uL (ref 3.87–5.11)
RDW: 18.9 % — ABNORMAL HIGH (ref 11.5–15.5)
WBC: 5.6 10*3/uL (ref 4.0–10.5)

## 2013-07-26 NOTE — Patient Instructions (Signed)
It was nice to see you!  We put in the Nexplanon today. -please keep the bandage on until tomorrow afternoon. -apply ice to the area tonight to help with bruising.  Go to the store and get Ferrous Sulfate 325mg  tablets.  Take 1 tablet twice a day. It may cause some stomach upset and constipation.  If it does, only take it once a day.  I will see you back once you get your regular medicaid to do a pap smear.

## 2013-07-26 NOTE — Progress Notes (Signed)
   Subjective:    Patient ID: Cassandra [name], female    DOB: 12-18-1990, 23 y.o.   MRN: [id number]  HPI Cassandra [name] is here for a PP check.  Postpartum Visit Patient is here for a postpartum visit. She is 8 weeks postpartum following a spontaneous vaginal delivery. I have fully reviewed the prenatal and intrapartum course. The delivery was at [redacted]w[redacted]d gestational weeks. Outcome: spontaneous vaginal delivery. Anesthesia: none.  Postpartum course has been normal. Baby's course has been normal. Baby is feeding by bottle - gerber. Bleeding continued to have bleeding 2-3 pads/day, no clots. Bowel function is normal. Bladder function is normal. Patient is not sexually active. Contraception method is received depo prior to discharge, plan to place nexplanon today. Postpartum depression screening: negative.   Current Outpatient Prescriptions on File Prior to Visit  Medication Sig Dispense Refill  . ibuprofen (ADVIL,MOTRIN) 600 MG tablet Take 1 tablet (600 mg total) by mouth every 6 (six) hours.  30 tablet  0  . IRON PO Take 1 tablet by mouth daily. Pt states taking prescription Iron supplement; unknown strength.      . Prenatal Vit-Fe Fumarate-FA (PRENATAL MULTIVITAMIN) TABS tablet Take 1 tablet by mouth daily at 12 noon.       No current facility-administered medications on file prior to visit.    I have reviewed and updated the following as appropriate: allergies and current medications SHx: non smoker  Health Maintenance: will need pap once she gets regular medicaid   Review of Systems See HPI    Objective:   Physical Exam BP 120/70  Pulse 80  Wt 129 lb (58.514 kg)  Breastfeeding? No Gen: alert, cooperative, NAD Abd: +BS, soft, NTND, fundus not palpable     Assessment & Plan:   Nexplanon Insertion Procedure Patient was given informed consent, she signed consent form.  Patient does understand that irregular bleeding is a very common side effect of this medication.   Patient on depo, last shot 05/27/13.  Appropriate time out taken.  Patient's left arm was prepped and draped in the usual sterile fashion.. The ruler used to measure and mark insertion area.  Patient was prepped with alcohol swab and then injected with 3 ml of 1% lidocaine.  She was prepped with betadine, Nexplanon removed from packaging,  Device confirmed in needle, then inserted full length of needle and withdrawn per handbook instructions. Nexplanon was able to palpated in the patient's arm; patient palpated the insert herself. There was minimal blood loss.  Patient insertion site covered with guaze and a pressure bandage to reduce any bruising.  The patient tolerated the procedure well and was given post procedure instructions.

## 2013-07-26 NOTE — Assessment & Plan Note (Signed)
Normal. Nexplanon placed today. Will check CBC since she has had irregular bleeding since delivery, likely from depo shot.  F/u once she gets regular medicaid for pap smear.

## 2013-07-27 MED ORDER — ETONOGESTREL 68 MG ~~LOC~~ IMPL
68.0000 mg | DRUG_IMPLANT | Freq: Once | SUBCUTANEOUS | Status: AC
Start: 2013-07-27 — End: 2013-07-25
  Administered 2013-07-25: 68 mg via SUBCUTANEOUS

## 2013-07-27 NOTE — Addendum Note (Signed)
Addended by: Christen Bame D on: 07/27/2013 08:32 AM   Modules accepted: Orders

## 2013-08-08 NOTE — Discharge Summary (Signed)
Obstetric Discharge Summary  Reason for Admission: precipitous delivery in MAU  Prenatal Procedures: None  Intrapartum Procedures: spontaneous vaginal delivery  Postpartum Procedures: none  Complications-Operative and Postpartum: none    Hemoglobin    Date  Value  Ref Range  Status    05/27/2013  9.3*  12.0 - 15.0 g/dL  Final       HCT    Date  Value  Ref Range  Status    05/27/2013  29.7*  36.0 - 46.0 %  Final     Precipitously delivered in MAU.     Delivery Note  At 12:33 PM a viable female was delivered via Vaginal, Spontaneous Delivery (Presentation: ; Occiput Posterior). APGAR:Pending, crying at pernieum weight .  Placenta status: Intact, Spontaneous. Cord: 3 vessels with the following complications: None. Cord pH: not sent  Anesthesia: None  Episiotomy: None  Lacerations: 1st degree  Suture Repair: 3.0 monocryl in usual fashion  Est. Blood Loss (mL): 300  Mom to postpartum. Baby to Couplet care / Skin to Skin.  Preciptious NSVD over 1st degree tear. Active mangment of 3rd stage with pit IM and traction. INtact placenta w/ 3v cord. 1st degree repaired in usual fashion. EBL 300. Hemostatic, counts correct. Pt was not treated for GBS  Cassandra [name]  05/27/2013, 12:50 PM       Pt precipitously delivered. Had no PP complications and was discharged after receiving depo. Pt to follow up in clinic.  Discharge Diagnoses: Term Pregnancy-delivered  Discharge Information:  Date: 07/17/2013  Activity: pelvic rest  Diet: routine  Depo prior to discharge  Condition: stable  Instructions: refer to practice specific booklet  Discharge to: home     Follow-up Information      Follow up with [facility]. Schedule an appointment as soon as possible for a visit in 4 weeks.      Contact information:      [place]  [place] [address]          Newborn Data:  Live born female  Birth Weight: 6 lb 12.8 oz (3084 g)  APGAR: 9, 9  Home with mother.  Fredrik Rigger   07/17/2013, 1:59 PM

## 2013-08-10 NOTE — Discharge Summary (Signed)
Attestation of Attending Supervision of Obstetric Fellow: Evaluation and management procedures were performed by the Obstetric Fellow under my supervision and collaboration.  I have reviewed the Obstetric Fellow's note and chart, and I agree with the management and plan.  Laylonie Marzec, MD, FACOG Attending Obstetrician & Gynecologist Faculty Practice, [facility]   

## 2013-09-01 ENCOUNTER — Ambulatory Visit: Payer: Medicaid Other | Admitting: Nurse Practitioner

## 2014-02-19 ENCOUNTER — Encounter: Payer: Self-pay | Admitting: Emergency Medicine

## 2014-02-24 ENCOUNTER — Emergency Department (HOSPITAL_COMMUNITY): Payer: Medicaid Other

## 2014-02-24 ENCOUNTER — Encounter (HOSPITAL_COMMUNITY): Payer: Self-pay | Admitting: *Deleted

## 2014-02-24 ENCOUNTER — Emergency Department (HOSPITAL_COMMUNITY)
Admission: EM | Admit: 2014-02-24 | Discharge: 2014-02-24 | Disposition: A | Discharge status: Home / Self Care | Payer: Medicaid Other | Attending: Emergency Medicine | Admitting: Emergency Medicine

## 2014-02-24 DIAGNOSIS — R059 Cough, unspecified: Secondary | ICD-10-CM

## 2014-02-24 DIAGNOSIS — N390 Urinary tract infection, site not specified: Secondary | ICD-10-CM | POA: Diagnosis not present

## 2014-02-24 DIAGNOSIS — M549 Dorsalgia, unspecified: Secondary | ICD-10-CM | POA: Diagnosis present

## 2014-02-24 DIAGNOSIS — R1031 Right lower quadrant pain: Secondary | ICD-10-CM | POA: Insufficient documentation

## 2014-02-24 DIAGNOSIS — N858 Other specified noninflammatory disorders of uterus: Secondary | ICD-10-CM | POA: Diagnosis not present

## 2014-02-24 DIAGNOSIS — R110 Nausea: Secondary | ICD-10-CM | POA: Insufficient documentation

## 2014-02-24 DIAGNOSIS — Z79899 Other long term (current) drug therapy: Secondary | ICD-10-CM | POA: Diagnosis not present

## 2014-02-24 DIAGNOSIS — R05 Cough: Secondary | ICD-10-CM | POA: Insufficient documentation

## 2014-02-24 DIAGNOSIS — Z3202 Encounter for pregnancy test, result negative: Secondary | ICD-10-CM | POA: Diagnosis not present

## 2014-02-24 DIAGNOSIS — R102 Pelvic and perineal pain: Secondary | ICD-10-CM

## 2014-02-24 LAB — COMPREHENSIVE METABOLIC PANEL
ALT: 107 U/L — ABNORMAL HIGH (ref 0–35)
AST: 110 U/L — ABNORMAL HIGH (ref 0–37)
Albumin: 3.7 g/dL (ref 3.5–5.2)
Alkaline Phosphatase: 90 U/L (ref 39–117)
Anion gap: 13 (ref 5–15)
BILIRUBIN TOTAL: 0.3 mg/dL (ref 0.3–1.2)
BUN: 8 mg/dL (ref 6–23)
CALCIUM: 9 mg/dL (ref 8.4–10.5)
CHLORIDE: 102 meq/L (ref 96–112)
CO2: 25 meq/L (ref 19–32)
Creatinine, Ser: 0.69 mg/dL (ref 0.50–1.10)
GFR calc Af Amer: >90 mL/min (ref >90)
Glucose, Bld: 95 mg/dL (ref 70–99)
Potassium: 3.5 mEq/L — ABNORMAL LOW (ref 3.7–5.3)
Sodium: 140 mEq/L (ref 137–147)
Total Protein: 7.6 g/dL (ref 6.0–8.3)

## 2014-02-24 LAB — CBC WITH DIFFERENTIAL/PLATELET
Basophils Absolute: 0 10*3/uL (ref 0.0–0.1)
Basophils Relative: 1 % (ref 0–1)
EOS PCT: 0 % (ref 0–5)
Eosinophils Absolute: 0 10*3/uL (ref 0.0–0.7)
HEMATOCRIT: 36.1 % (ref 36.0–46.0)
HEMOGLOBIN: 11.6 g/dL — AB (ref 12.0–15.0)
Lymphocytes Relative: 31 % (ref 12–46)
Lymphs Abs: 1.4 10*3/uL (ref 0.7–4.0)
MCH: 28 pg (ref 26.0–34.0)
MCHC: 32.1 g/dL (ref 30.0–36.0)
MCV: 87 fL (ref 78.0–100.0)
MONOS PCT: 10 % (ref 3–12)
Monocytes Absolute: 0.4 10*3/uL (ref 0.1–1.0)
NEUTROS ABS: 2.6 10*3/uL (ref 1.7–7.7)
Neutrophils Relative %: 58 % (ref 43–77)
Platelets: 295 10*3/uL (ref 150–400)
RBC: 4.15 MIL/uL (ref 3.87–5.11)
RDW: 14.6 % (ref 11.5–15.5)
WBC: 4.5 10*3/uL (ref 4.0–10.5)

## 2014-02-24 LAB — URINE MICROSCOPIC-ADD ON

## 2014-02-24 LAB — URINALYSIS, ROUTINE W REFLEX MICROSCOPIC
BILIRUBIN URINE: NEGATIVE
Glucose, UA: NEGATIVE mg/dL
KETONES UR: NEGATIVE mg/dL
NITRITE: NEGATIVE
Protein, ur: NEGATIVE mg/dL
Specific Gravity, Urine: 1.02 (ref 1.005–1.030)
UROBILINOGEN UA: 1 mg/dL (ref 0.0–1.0)
pH: 6 (ref 5.0–8.0)

## 2014-02-24 LAB — WET PREP, GENITAL: Trich, Wet Prep: NONE SEEN

## 2014-02-24 LAB — POC URINE PREG, ED: PREG TEST UR: NEGATIVE

## 2014-02-24 MED ORDER — SULFAMETHOXAZOLE-TRIMETHOPRIM 800-160 MG PO TABS: 1.0000 | ORAL_TABLET | Freq: Two times a day (BID) | ORAL | Status: DC

## 2014-02-24 MED ORDER — HYDROCODONE-ACETAMINOPHEN 5-325 MG PO TABS
2.0000 | ORAL_TABLET | Freq: Once | ORAL | Status: AC
  Administered 2014-02-24: 2 via ORAL
  Filled 2014-02-24: qty 2

## 2014-02-24 NOTE — ED Notes (Signed)
Discharge and follow up instructions reviewed with pt. Pt verbalized understanding.  

## 2014-02-24 NOTE — ED Provider Notes (Signed)
CSN: [id number]     Arrival date & time 02/24/14  1446 History   First MD Initiated Contact with Patient 02/24/14 1559     Chief Complaint  Patient presents with  . Back Pain  . Chills     (Consider location/radiation/quality/duration/timing/severity/associated sxs/prior Treatment) Patient is a 23 y.o. female presenting with back pain and abdominal pain. The history is provided by the patient.  Back Pain Associated symptoms: abdominal pain   Associated symptoms: no dysuria and no fever   Abdominal Pain Pain location:  RLQ Pain quality: aching and sharp   Pain severity:  Moderate Onset quality:  Gradual Timing:  Constant Progression:  Unchanged Chronicity:  New Relieved by: movement. Worsened by:  Nothing tried Associated symptoms: nausea   Associated symptoms: no chills, no cough, no dysuria, no fever, no shortness of breath, no vaginal bleeding, no vaginal discharge and no vomiting     Past Medical History  Diagnosis Date  . No pertinent past medical history    Past Surgical History  Procedure Laterality Date  . Wisdom teeth  2009   Family History  Problem Relation Age of Onset  . Asthma Mother   . Diabetes Mother   . Asthma Sister   . Asthma Brother   . Cancer Maternal Uncle   . Hypertension Paternal Grandfather    History  Substance Use Topics  . Smoking status: Never Smoker   . Smokeless tobacco: Never Used  . Alcohol Use: No   OB History    Gravida Para Term Preterm AB TAB SAB Ectopic Multiple Living   2 2 2  0 0 0 0 0 0 2     Review of Systems  Constitutional: Negative for fever and chills.  Respiratory: Negative for cough and shortness of breath.   Gastrointestinal: Positive for nausea and abdominal pain. Negative for vomiting.  Genitourinary: Negative for dysuria, vaginal bleeding and vaginal discharge.  Musculoskeletal: Positive for back pain.  All other systems reviewed and are negative.     Allergies  Review of patient's allergies  indicates no known allergies.  Home Medications   Prior to Admission medications   Medication Sig Start Date End Date Taking? Authorizing Provider  etonogestrel (IMPLANON) 68 MG IMPL implant Inject 1 each into the skin once. 07/26/13   Historical Provider, MD  ibuprofen (ADVIL,MOTRIN) 600 MG tablet Take 1 tablet (600 mg total) by mouth every 6 (six) hours. 05/29/13   Frazier Richards, MD  IRON PO Take 1 tablet by mouth daily. Pt states taking prescription Iron supplement; unknown strength.    Historical Provider, MD  Prenatal Vit-Fe Fumarate-FA (PRENATAL MULTIVITAMIN) TABS tablet Take 1 tablet by mouth daily at 12 noon.    Historical Provider, MD   BP 114/71 mmHg  Pulse 82  Temp(Src) 98.2 F (36.8 C) (Oral)  Resp 18  SpO2 100% Physical Exam  Constitutional: She is oriented to person, place, and time. She appears well-developed and well-nourished. No distress.  HENT:  Head: Normocephalic and atraumatic.  Mouth/Throat: Oropharynx is clear and moist.  Eyes: EOM are normal. Pupils are equal, round, and reactive to light.  Neck: Normal range of motion. Neck supple.  Cardiovascular: Normal rate and regular rhythm.  Exam reveals no friction rub.   No murmur heard. Pulmonary/Chest: Effort normal and breath sounds normal. No respiratory distress. She has no wheezes. She has no rales.  Abdominal: Soft. She exhibits no distension. There is tenderness (RLQ). There is no rebound.  Genitourinary: Cervix exhibits no motion tenderness  and no friability. Right adnexum displays tenderness (moderate). Left adnexum displays tenderness (mild).  Very mild central cervical redness  Musculoskeletal: Normal range of motion. She exhibits no edema.  Neurological: She is alert and oriented to person, place, and time.  Skin: Skin is warm. No rash noted. She is not diaphoretic.  Nursing note and vitals reviewed.   ED Course  Procedures (including critical care time) Labs Review Labs Reviewed  CBC WITH  DIFFERENTIAL - Abnormal; Notable for the following:    Hemoglobin 11.6 (*)    All other components within normal limits  COMPREHENSIVE METABOLIC PANEL - Abnormal; Notable for the following:    Potassium 3.5 (*)    AST 110 (*)    ALT 107 (*)    All other components within normal limits  GC/CHLAMYDIA PROBE AMP  WET PREP, GENITAL  URINALYSIS, ROUTINE W REFLEX MICROSCOPIC  POC URINE PREG, ED    Imaging Review Dg Chest 2 View  02/24/2014   CLINICAL DATA:  Shortness of breath, cough, right-sided chest pain  EXAM: CHEST  2 VIEW  COMPARISON:  None.  FINDINGS: The heart size and mediastinal contours are within normal limits. Both lungs are clear. The visualized skeletal structures are unremarkable.  IMPRESSION: No active cardiopulmonary disease.   Electronically Signed   By: Conchita Paris M.D.   On: 02/24/2014 17:05   US Transvaginal Non-ob  02/24/2014   CLINICAL DATA:  Right adnexal tenderness.  EXAM: TRANSABDOMINAL AND TRANSVAGINAL ULTRASOUND OF PELVIS  TECHNIQUE: Both transabdominal and transvaginal ultrasound examinations of the pelvis were performed. Transabdominal technique was performed for global imaging of the pelvis including uterus, ovaries, adnexal regions, and pelvic cul-de-sac. It was necessary to proceed with endovaginal exam following the transabdominal exam to visualize the ovaries.  COMPARISON:  Obstetrical ultrasound dated 03/31/2013.  FINDINGS: Uterus  Measurements: 7.3 x 5.1 x 3.8 cm. No fibroids or other mass visualized.  Endometrium  Thickness: 2.9 mm.  No focal abnormality visualized.  Right ovary  Measurements: 3.2 x 3.2 x 2.9 cm. 2.4 cm follicular cyst. Normal internal blood flow with color Doppler.  Left ovary  Measurements: 3.5 x 2.4 x 2.1 cm. 2.5 cm follicular cyst containing a daughter cyst. Normal internal blood flow with color Doppler.  Other findings  No free fluid.  IMPRESSION: Normal examination.   Electronically Signed   By: Enrique Sack M.D.   On: 02/24/2014 18:34    US Pelvis Complete  02/24/2014   CLINICAL DATA:  Right adnexal tenderness.  EXAM: TRANSABDOMINAL AND TRANSVAGINAL ULTRASOUND OF PELVIS  TECHNIQUE: Both transabdominal and transvaginal ultrasound examinations of the pelvis were performed. Transabdominal technique was performed for global imaging of the pelvis including uterus, ovaries, adnexal regions, and pelvic cul-de-sac. It was necessary to proceed with endovaginal exam following the transabdominal exam to visualize the ovaries.  COMPARISON:  Obstetrical ultrasound dated 03/31/2013.  FINDINGS: Uterus  Measurements: 7.3 x 5.1 x 3.8 cm. No fibroids or other mass visualized.  Endometrium  Thickness: 2.9 mm.  No focal abnormality visualized.  Right ovary  Measurements: 3.2 x 3.2 x 2.9 cm. 2.4 cm follicular cyst. Normal internal blood flow with color Doppler.  Left ovary  Measurements: 3.5 x 2.4 x 2.1 cm. 2.5 cm follicular cyst containing a daughter cyst. Normal internal blood flow with color Doppler.  Other findings  No free fluid.  IMPRESSION: Normal examination.   Electronically Signed   By: Enrique Sack M.D.   On: 02/24/2014 18:34     EKG Interpretation None  MDM   Final diagnoses:  Right adnexal tenderness  Cough  UTI (lower urinary tract infection)    23 year old female presents with some back pain, right lower quadrant pain, right flank pain. Present for past few days. She also has had some chills, cough and some shortness of breath. Denies fevers, vomiting. On exam vitals stable. Back without tenderness. No CVA tenderness. Mild RLQ pain on exam with deep palpation. She has mildly red cervix with R adnexal tenderness. With associated pelvic findings, will check pelvic US first. UA c/w UTI, treated with bactrim. Stable for discharge. [place] negative.  Evelina Bucy, MD 02/24/14 [phone]

## 2014-02-24 NOTE — Discharge Instructions (Signed)

## 2014-02-24 NOTE — ED Notes (Signed)
Patient transported to radiology

## 2014-02-24 NOTE — ED Notes (Signed)
Patient returned from Ultrasound. 

## 2014-02-24 NOTE — ED Notes (Signed)
Pt reports right side lower back/side pain for several days, increases with movement and cough. Reports n/v, pt feeling constipated and last bm was Monday. Having side pain when she urinates. Denies vaginal symtpoms.

## 2014-02-24 NOTE — ED Notes (Signed)
Patient transported to Ultrasound 

## 2014-02-27 LAB — GC/CHLAMYDIA PROBE AMP
CT Probe RNA: POSITIVE — AB
GC Probe RNA: POSITIVE — AB

## 2014-02-28 ENCOUNTER — Telehealth (HOSPITAL_BASED_OUTPATIENT_CLINIC_OR_DEPARTMENT_OTHER): Payer: Self-pay | Admitting: Emergency Medicine

## 2014-02-28 NOTE — Telephone Encounter (Signed)
Chart handoff to EDP, +chlamydia and +gonorrhea not treated

## 2014-03-06 MED ORDER — CEFIXIME 400 MG PO TABS
400.0000 mg | ORAL_TABLET | Freq: Every day | ORAL | Status: DC
Start: 2014-03-06 — End: 2016-04-15

## 2014-03-06 MED ORDER — AZITHROMYCIN 500 MG PO TABS: 2000.0000 mg | ORAL_TABLET | Freq: Every day | ORAL | Status: DC

## 2014-03-07 ENCOUNTER — Telehealth (HOSPITAL_BASED_OUTPATIENT_CLINIC_OR_DEPARTMENT_OTHER): Payer: Self-pay | Admitting: Emergency Medicine

## 2014-03-07 NOTE — Telephone Encounter (Incomplete)
Post ED Visit - Positive Culture Follow-up: Successful Patient Follow-Up  Culture assessed and recommendations reviewed by: []  Wes [place], Pharm.D., BCPS []  Heide Guile, Pharm.D., BCPS []  Alycia Rossetti, Pharm.D., BCPS []  [place], Pharm.D., BCPS, AAHIVP []  Legrand [place], Pharm.D., BCPS, AAHIVP []  Hassie Bruce, Pharm.D. []  Milus Glazier, [place].D.  Positive GC and chlamydia culture  [x]  Patient discharged without antimicrobial prescription and treatment is now indicated []  Organism is resistant to prescribed ED discharge antimicrobial []  Patient with positive blood cultures  Changes discussed with ED provider: Tanna Furry MD New antibiotic prescription suprax  400mg  po x 1, azithromycin 2 grams po once Called to ***  Contacted patient, date ***, time ***   Hazle Nordmann 03/07/2014, 5:22 PM

## 2014-07-10 ENCOUNTER — Emergency Department (HOSPITAL_COMMUNITY)
Admission: EM | Admit: 2014-07-10 | Discharge: 2014-07-10 | Disposition: A | Discharge status: Home / Self Care | Payer: Medicaid Other | Attending: Emergency Medicine | Admitting: Emergency Medicine

## 2014-07-10 ENCOUNTER — Encounter (HOSPITAL_COMMUNITY): Payer: Self-pay

## 2014-07-10 DIAGNOSIS — Z23 Encounter for immunization: Secondary | ICD-10-CM | POA: Diagnosis not present

## 2014-07-10 DIAGNOSIS — Y9289 Other specified places as the place of occurrence of the external cause: Secondary | ICD-10-CM | POA: Diagnosis not present

## 2014-07-10 DIAGNOSIS — Y998 Other external cause status: Secondary | ICD-10-CM | POA: Diagnosis not present

## 2014-07-10 DIAGNOSIS — S01511A Laceration without foreign body of lip, initial encounter: Secondary | ICD-10-CM | POA: Diagnosis present

## 2014-07-10 DIAGNOSIS — Z79899 Other long term (current) drug therapy: Secondary | ICD-10-CM | POA: Insufficient documentation

## 2014-07-10 DIAGNOSIS — Y9389 Activity, other specified: Secondary | ICD-10-CM | POA: Diagnosis not present

## 2014-07-10 DIAGNOSIS — Z792 Long term (current) use of antibiotics: Secondary | ICD-10-CM | POA: Diagnosis not present

## 2014-07-10 MED ORDER — AMOXICILLIN-POT CLAVULANATE 875-125 MG PO TABS: 1.0000 | ORAL_TABLET | Freq: Two times a day (BID) | ORAL | Status: DC

## 2014-07-10 MED ORDER — AMOXICILLIN-POT CLAVULANATE 875-125 MG PO TABS
1.0000 | ORAL_TABLET | Freq: Once | ORAL | Status: AC
  Administered 2014-07-10: 1 via ORAL
  Filled 2014-07-10: qty 1

## 2014-07-10 MED ORDER — HYDROCODONE-ACETAMINOPHEN 5-325 MG PO TABS: ORAL_TABLET | ORAL | Status: DC

## 2014-07-10 MED ORDER — TETANUS-DIPHTH-ACELL PERTUSSIS 5-2.5-18.5 LF-MCG/0.5 IM SUSP
0.5000 mL | Freq: Once | INTRAMUSCULAR | Status: AC
  Administered 2014-07-10: 0.5 mL via INTRAMUSCULAR
  Filled 2014-07-10: qty 0.5

## 2014-07-10 MED ORDER — HYDROCODONE-ACETAMINOPHEN 5-325 MG PO TABS
1.0000 | ORAL_TABLET | Freq: Once | ORAL | Status: AC
  Administered 2014-07-10: 1 via ORAL
  Filled 2014-07-10: qty 1

## 2014-07-10 NOTE — Discharge Instructions (Signed)
Eat a soft diet for the next 24-48 hours.  Rinse your mouth with saltwater or regular water after you eat any food.  Take vicodin for breakthrough pain, do not drink alcohol, drive, care for children or do other critical tasks while taking vicodin.  If you see signs of infection (warmth, redness, tenderness, pus, sharp increase in pain, fever, red streaking) immediately return to the emergency department.  Please follow with your primary care doctor in the next 2 days for a check-up. They must obtain records for further management.   Do not hesitate to return to the Emergency Department for any new, worsening or concerning symptoms.
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
## 2014-11-08 ENCOUNTER — Ambulatory Visit: Payer: Medicaid Other | Admitting: Family Medicine

## 2014-11-30 ENCOUNTER — Encounter (HOSPITAL_COMMUNITY): Payer: Self-pay | Admitting: Emergency Medicine

## 2014-11-30 ENCOUNTER — Emergency Department (HOSPITAL_COMMUNITY)
Admission: EM | Admit: 2014-11-30 | Discharge: 2014-11-30 | Disposition: A | Discharge status: Home / Self Care | Payer: Medicaid Other | Attending: Emergency Medicine | Admitting: Emergency Medicine

## 2014-11-30 DIAGNOSIS — Z792 Long term (current) use of antibiotics: Secondary | ICD-10-CM | POA: Diagnosis not present

## 2014-11-30 DIAGNOSIS — K047 Periapical abscess without sinus: Secondary | ICD-10-CM

## 2014-11-30 MED ORDER — AMOXICILLIN 500 MG PO CAPS: 500.0000 mg | ORAL_CAPSULE | Freq: Three times a day (TID) | ORAL | Status: DC

## 2014-11-30 MED ORDER — HYDROCODONE-ACETAMINOPHEN 5-325 MG PO TABS: ORAL_TABLET | ORAL | Status: DC

## 2014-11-30 NOTE — ED Notes (Signed)
Patient states R lower dental abscess that started hurting 2 weeks ago after she broke a tooth.   Patient states she has been using ibuprofen liquid gels to help with pain.  Patient states saw dentist in 5/16, but they referred her to an oral surgeon that did not call her back.   Patient complains of dental pain.

## 2014-11-30 NOTE — ED Provider Notes (Signed)
CSN: [id number]     Arrival date & time 11/30/14  1307 History  This chart was scribed for non-physician practitioner Monico Blitz, PA-C working with Forde Dandy, MD by Zola Button, ED Scribe. This patient was seen in room TR07C/TR07C and the patient's care was started at 1:23 PM.       Chief Complaint  Patient presents with  . dental abscess    The history is provided by the patient. No language interpreter was used.   HPI Comments: ANYSIA [name] is Cassandra 24 y.o. female who presents to the Emergency Department complaining of gradual onset, left lower dental pain that started 2 weeks ago, but began swelling 2 days ago. Patient believes she has Cassandra dental abscess. She reports having associated ear pain and headache. Patient denies fever and chills. She has an upcoming appointment with Cassandra dentist in 4 days. NKDA.   Past Medical History  Diagnosis Date  . No pertinent past medical history    Past Surgical History  Procedure Laterality Date  . Wisdom teeth  2009   Family History  Problem Relation Age of Onset  . Asthma Mother   . Diabetes Mother   . Asthma Sister   . Asthma Brother   . Cancer Maternal Uncle   . Hypertension Paternal Grandfather    Social History  Substance Use Topics  . Smoking status: Never Smoker   . Smokeless tobacco: Never Used  . Alcohol Use: No   OB History    Gravida Para Term Preterm AB TAB SAB Ectopic Multiple Living   2 2 2  0 0 0 0 0 0 2     Review of Systems Cassandra complete 10 system review of systems was obtained and all systems are negative except as noted in the HPI and PMH.     Allergies  Review of patient's allergies indicates no known allergies.  Home Medications   Prior to Admission medications   Medication Sig Start Date End Date Taking? Authorizing Provider  acetaminophen (TYLENOL) 500 MG tablet Take 500 mg by mouth every 8 (eight) hours as needed for mild pain or moderate pain.    Historical Provider, MD  amoxicillin-clavulanate  (AUGMENTIN) 875-125 MG per tablet Take 1 tablet by mouth every 12 (twelve) hours. 07/10/14   Shirlie Enck, PA-C  azithromycin (ZITHROMAX) 500 MG tablet Take 4 tablets (2,000 mg total) by mouth daily. 03/06/14   Tanna Furry, MD  cefixime (SUPRAX) 400 MG tablet Take 1 tablet (400 mg total) by mouth daily. 03/06/14   Tanna Furry, MD  etonogestrel (IMPLANON) 68 MG IMPL implant Inject 1 each into the skin once. 07/26/13   Historical Provider, MD  HYDROcodone-acetaminophen (NORCO/VICODIN) 5-325 MG per tablet Take 1-2 tablets by mouth every 6 hours as needed for pain and/or cough. 07/10/14   Jiles Goya, PA-C  ibuprofen (ADVIL,MOTRIN) 600 MG tablet Take 1 tablet (600 mg total) by mouth every 6 (six) hours. 05/29/13   Frazier Richards, MD  sulfamethoxazole-trimethoprim (SEPTRA DS) 800-160 MG per tablet Take 1 tablet by mouth every 12 (twelve) hours. 02/24/14   Evelina Bucy, MD   BP 124/74 mmHg  Pulse 77  Temp(Src) 98.7 F (37.1 C) (Oral)  Resp 18  Ht 5\' 4"  (1.626 m)  Wt 120 lb (54.432 kg)  BMI 20.59 kg/m2  SpO2 100% Physical Exam  Constitutional: She is oriented to person, place, and time. She appears well-developed and well-nourished. No distress.  HENT:  Head: Normocephalic and atraumatic.  Mouth/Throat: Uvula is midline  and oropharynx is clear and moist. No trismus in the jaw. No uvula swelling. No oropharyngeal exudate, posterior oropharyngeal edema, posterior oropharyngeal erythema or tonsillar abscesses.  Patient is slightly swollen to right lower jaw. No overlying skin changes. Generally good dentition, she is right lower gingival swelling with no focal fluctuance. Patient is handling their secretions. There is no tenderness to palpation or firmness underneath tongue bilaterally. No trismus.    Eyes: Conjunctivae and EOM are normal. Pupils are equal, round, and reactive to light.  Neck: Neck supple.  Cardiovascular: Normal rate.   Pulmonary/Chest: Effort normal. No stridor.  Musculoskeletal:  Normal range of motion. She exhibits no edema.  Lymphadenopathy:    She has no cervical adenopathy.  Neurological: She is alert and oriented to person, place, and time. No cranial nerve deficit.  Skin: Skin is warm and dry. No rash noted.  Psychiatric: She has Cassandra normal mood and affect. Her behavior is normal.  Nursing note and vitals reviewed.   ED Course  Procedures  DIAGNOSTIC STUDIES: Oxygen Saturation is 100% on room air, normal by my interpretation.    COORDINATION OF CARE: 1:26 PM-Discussed treatment plan which includes pain medication and antibiotics with patient/guardian at bedside and patient/guardian agreed to plan. Patient will not be driving herself home. Offered incision and drainage and patient declines.   Labs Review Labs Reviewed - No data to display  Imaging Review No results found. [name] personally reviewed and evaluated these images and lab results as part of my medical decision-making.   EKG Interpretation None      MDM   Final diagnoses:  Dental abscess    Filed Vitals:   11/30/14 1312  BP: 124/74  Pulse: 77  Temp: 98.7 F (37.1 C)  TempSrc: Oral  Resp: 18  Height: 5\' 4"  (1.626 m)  Weight: 120 lb (54.432 kg)  SpO2: 100%    Cassandra [name] is Cassandra pleasant 24 y.o. female presenting with dental pain and Cassandra [name] dental abscess, no signs of deep tissue infection or systemic infection. Patient will be started on Vicodin and amoxicillin, extensive discussion of return precautions.  Evaluation does not show pathology that would require ongoing emergent intervention or inpatient treatment. Pt is hemodynamically stable and mentating appropriately. Discussed findings and plan with patient/guardian, who agrees with care plan. All questions answered. Return precautions discussed and outpatient follow up given.    [name] personally performed the services described in this documentation, which was scribed in my presence. The recorded information has been reviewed  and is accurate.    Monico Blitz, PA-C 11/30/14 [place], MD 11/30/14 2028

## 2014-11-30 NOTE — Discharge Instructions (Signed)
Take your antibiotics as directed and to completion. You should never have any leftover antibiotics! Push fluids and stay well hydrated.   Any antibiotic use can reduce the efficacy of hormonal birth control. Please use back up method of contraception.   Take vicodin for breakthrough pain, do not drink alcohol, drive, care for children or do other critical tasks while taking vicodin.  Return to the emergency room for fever, change in vision, redness to the face that rapidly spreads towards the eye, nausea or vomiting, difficulty swallowing or shortness of breath.   Apply warm compresses to jaw throughout the day.   Take your antibiotics as directed and to the end of the course. DO NOT drink alcohol when taking metronidazole, it will make you very sick!   Followup with a dentist is very important for ongoing evaluation and management of recurrent dental pain. Return to emergency department for emergent changing or worsening symptoms.   Dental Abscess A dental abscess is a collection of infected fluid (pus) from a bacterial infection in the inner part of the tooth (pulp). It usually occurs at the end of the tooth's root.  CAUSES   Severe tooth decay.  Trauma to the tooth that allows bacteria to enter into the pulp, such as a broken or chipped tooth. SYMPTOMS   Severe pain in and around the infected tooth.  Swelling and redness around the abscessed tooth or in the mouth or face.  Tenderness.  Pus drainage.  Bad breath.  Bitter taste in the mouth.  Difficulty swallowing.  Difficulty opening the mouth.  Nausea.  Vomiting.  Chills.  Swollen neck glands. DIAGNOSIS   A medical and dental history will be taken.  An examination will be performed by tapping on the abscessed tooth.  X-rays may be taken of the tooth to identify the abscess. TREATMENT The goal of treatment is to eliminate the infection. You may be prescribed antibiotic medicine to stop the infection from  spreading. A root canal may be performed to save the tooth. If the tooth cannot be saved, it may be pulled (extracted) and the abscess may be drained.  HOME CARE INSTRUCTIONS  Only take over-the-counter or prescription medicines for pain, fever, or discomfort as directed by your caregiver.  Rinse your mouth (gargle) often with salt water ( tsp salt in 8 oz [250 ml] of warm water) to relieve pain or swelling.  Do not drive after taking pain medicine (narcotics).  Do not apply heat to the outside of your face.  Return to your dentist for further treatment as directed. SEEK MEDICAL CARE IF:  Your pain is not helped by medicine.  Your pain is getting worse instead of better. SEEK IMMEDIATE MEDICAL CARE IF:  You have a fever or persistent symptoms for more than 2-3 days.  You have a fever and your symptoms suddenly get worse.  You have chills or a very bad headache.  You have problems breathing or swallowing.  You have trouble opening your mouth.  You have swelling in the neck or around the eye. Document Released: 04/06/2005 Document Revised: 12/30/2011 Document Reviewed: 07/15/2010 [facility] Patient Information 2015 [place], [place]. This information is not intended to replace advice given to you by your health care provider. Make sure you discuss any questions you have with your health care provider.   Low-cost dental clinic: Jonna Coup  at [phone]**  **[place] at [phone] [address]**    You may also call [phone]  Dental Assistance If  the dentist on-call cannot see you, please use the resources below:   Patients with Medicaid: [place], [place] [address], [phone]  If unable to pay, or uninsured, contact [facility] [phone]) or [place] [phone] in [place], [place] in [facility]) to become qualified for the adult dental clinic  Other [place] [place], [place], [place], [address]    [phone], Ext. [phone]    2nd and 4th Thursday of the month at 6:30am    10 clients each day by appointment, can sometimes see walk-inpatients if someone does not show for an appointment [facility]- [address] [place], [place], [address]    [phone] [facility]- [address], [place], [place], [address]    [phone]  [place] Department- [phone] [facility] Department- [phone]

## 2014-11-30 NOTE — ED Notes (Signed)
Left lower dental pain with swelling.

## 2015-09-24 ENCOUNTER — Ambulatory Visit (INDEPENDENT_AMBULATORY_CARE_PROVIDER_SITE_OTHER): Payer: Medicaid Other | Admitting: Family Medicine

## 2015-09-24 ENCOUNTER — Encounter: Payer: Self-pay | Admitting: Family Medicine

## 2015-09-24 VITALS — BP 100/66 | HR 83 | Temp 98.3°F | Ht 64.0 in | Wt 130.0 lb

## 2015-09-24 DIAGNOSIS — Z3009 Encounter for other general counseling and advice on contraception: Secondary | ICD-10-CM
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
## 2015-09-24 NOTE — Assessment & Plan Note (Signed)
Discussed multiple contraceptive options including OCPs, depo injections, IUDs, and sterilization Discussed with patient that since she is 32, she may wish to wait until her late 54s to decide on permanent sterilization Patient wishes to have LARC at this time After discussion, patient interested in [place] IUD placement Nexplanon removal Patient to return in 2-3 weeks when appointment available for IUD insertion, Nexplanon removal, and Pap smear

## 2015-09-24 NOTE — Patient Instructions (Signed)
Nice to meet you today. Please look over the information about the IUD. I'll see back in a few weeks to have the Nexplanon removed, and IUD placed, and get a Pap smear.  If you have any questions in the meantime, let us know.  Take care, Dr. Jacinto Reap

## 2015-10-18 ENCOUNTER — Telehealth: Payer: Self-pay

## 2015-10-18 NOTE — Telephone Encounter (Signed)
LVM for pt to call the office. Would like to ask her if she can come at 1:30. That would give an extra few minutes for the procedure. Do not change the apt time, just have pt come at 1:30.  Ottis Stain, CMA

## 2015-10-21 ENCOUNTER — Ambulatory Visit: Payer: Medicaid Other | Admitting: Family Medicine

## 2015-10-31 ENCOUNTER — Ambulatory Visit: Payer: Medicaid Other | Admitting: Family Medicine

## 2016-03-27 ENCOUNTER — Ambulatory Visit (INDEPENDENT_AMBULATORY_CARE_PROVIDER_SITE_OTHER): Payer: Self-pay | Admitting: *Deleted

## 2016-03-27 DIAGNOSIS — Z111 Encounter for screening for respiratory tuberculosis: Secondary | ICD-10-CM

## 2016-03-27 NOTE — Progress Notes (Signed)
   PPD Placement note Cassandra [name], 25 y.o. female is here today for placement of PPD test Reason for PPD test: employment Pt taken PPD test before: yes Verified in allergy area and with patient that they are not allergic to the products PPD is made of (Phenol or Tween). No: NO Is patient taking any oral or IV steroid medication now or have they taken it in the last month? no Has the patient ever received the BCG vaccine?: no Has the patient been in recent contact with anyone known or suspected of having active TB disease?: no      Date of exposure (if applicable): N/A      Name of person they were exposed to (if applicable): N/A Patient's Country of origin?: N/A O: Alert and oriented in NAD. P:  PPD placed on 03/27/2016.  Patient advised to return for reading within 48-72 hours.  Derl Barrow, RN

## 2016-03-30 ENCOUNTER — Ambulatory Visit (INDEPENDENT_AMBULATORY_CARE_PROVIDER_SITE_OTHER): Payer: Self-pay | Admitting: *Deleted

## 2016-03-30 DIAGNOSIS — Z111 Encounter for screening for respiratory tuberculosis: Secondary | ICD-10-CM

## 2016-03-30 LAB — TB SKIN TEST
INDURATION: 0 mm
TB Skin Test: NEGATIVE

## 2016-03-30 NOTE — Progress Notes (Signed)
PPD Reading Note PPD read and results entered in Epic Result: 0 mm induration. Interpretation: negative If test not read within 48-72 hours of initial placement, patient advised to repeat in other arm 1-3 weeks after this test. Allergic reaction: no Patient results were placed on her employment form and also a letter was given to her with these results also. Jazmin Hartsell,CMA

## 2016-04-09 ENCOUNTER — Encounter: Payer: Medicaid Other | Admitting: Family Medicine

## 2016-04-15 ENCOUNTER — Encounter: Payer: Self-pay | Admitting: Family Medicine

## 2016-04-15 ENCOUNTER — Ambulatory Visit (INDEPENDENT_AMBULATORY_CARE_PROVIDER_SITE_OTHER): Payer: Medicaid Other | Admitting: Family Medicine

## 2016-04-15 VITALS — BP 110/76 | HR 81 | Temp 98.1°F | Wt 136.0 lb

## 2016-04-15 DIAGNOSIS — Z0000 Encounter for general adult medical examination without abnormal findings: Secondary | ICD-10-CM

## 2016-04-15 DIAGNOSIS — Z3009 Encounter for other general counseling and advice on contraception: Secondary | ICD-10-CM | POA: Insufficient documentation

## 2016-04-15 DIAGNOSIS — Z79899 Other long term (current) drug therapy: Secondary | ICD-10-CM | POA: Diagnosis not present

## 2016-04-15 LAB — CBC
HCT: 38.5 % (ref 35.0–45.0)
HEMOGLOBIN: 12.6 g/dL (ref 11.7–15.5)
MCH: 31.3 pg (ref 27.0–33.0)
MCHC: 32.7 g/dL (ref 32.0–36.0)
MCV: 95.8 fL (ref 80.0–100.0)
MPV: 10.6 fL (ref 7.5–12.5)
Platelets: 283 10*3/uL (ref 140–400)
RBC: 4.02 MIL/uL (ref 3.80–5.10)
RDW: 14.1 % (ref 11.0–15.0)
WBC: 5.9 10*3/uL (ref 3.8–10.8)

## 2016-04-15 LAB — BASIC METABOLIC PANEL WITH GFR
BUN: 9 mg/dL (ref 7–25)
CALCIUM: 9.2 mg/dL (ref 8.6–10.2)
CO2: 25 mmol/L (ref 20–31)
Chloride: 105 mmol/L (ref 98–110)
Creat: 0.68 mg/dL (ref 0.50–1.10)
GFR, Est African American: >89 mL/min (ref >=60)
Glucose, Bld: 91 mg/dL (ref 65–99)
Potassium: 4 mmol/L (ref 3.5–5.3)
Sodium: 139 mmol/L (ref 135–146)

## 2016-04-15 LAB — LIPID PANEL
CHOL/HDL RATIO: 3.9 ratio (ref <5.0)
Cholesterol: 155 mg/dL (ref <200)
HDL: 40 mg/dL — AB (ref >50)
LDL CALC: 103 mg/dL — AB (ref <100)
Triglycerides: 59 mg/dL (ref <150)
VLDL: 12 mg/dL (ref <30)

## 2016-04-15 NOTE — Patient Instructions (Signed)

## 2016-04-15 NOTE — Assessment & Plan Note (Addendum)
Patient is here for annual physical exam. She denies any issues at this time. She states she feels well and has no topics of concern. - Patient deferred Pap smear until PCP is available. - Patient would like replacement of contraceptive implant this coming April. - Discussed daily vitamin with iron replacement as most recent CBC (in 2015) yielded a hemoglobin below the normal range. - Labs obtained today. (Lipid, BMP, CBC) - Flu vaccine provided today.
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
## 2016-04-16 ENCOUNTER — Encounter: Payer: Self-pay | Admitting: Family Medicine

## 2016-05-12 ENCOUNTER — Emergency Department (HOSPITAL_COMMUNITY)
Admission: EM | Admit: 2016-05-12 | Discharge: 2016-05-12 | Disposition: A | Discharge status: Home / Self Care | Payer: Medicaid Other | Attending: Emergency Medicine | Admitting: Emergency Medicine

## 2016-05-12 ENCOUNTER — Encounter (HOSPITAL_COMMUNITY): Payer: Self-pay | Admitting: Emergency Medicine

## 2016-05-12 DIAGNOSIS — S0181XA Laceration without foreign body of other part of head, initial encounter: Secondary | ICD-10-CM

## 2016-05-12 DIAGNOSIS — Y999 Unspecified external cause status: Secondary | ICD-10-CM | POA: Insufficient documentation

## 2016-05-12 DIAGNOSIS — Y929 Unspecified place or not applicable: Secondary | ICD-10-CM | POA: Insufficient documentation

## 2016-05-12 DIAGNOSIS — Y9302 Activity, running: Secondary | ICD-10-CM | POA: Insufficient documentation

## 2016-05-12 DIAGNOSIS — W228XXA Striking against or struck by other objects, initial encounter: Secondary | ICD-10-CM | POA: Insufficient documentation

## 2016-05-12 MED ORDER — LIDOCAINE HCL (PF) 1 % IJ SOLN
2.0000 mL | Freq: Once | INTRAMUSCULAR | Status: AC
  Administered 2016-05-12: 2 mL
  Filled 2016-05-12: qty 5

## 2016-05-12 NOTE — ED Notes (Signed)
ED Provider at bedside. 

## 2016-05-12 NOTE — Discharge Instructions (Signed)
Suture removal in 5-7 days

## 2016-05-12 NOTE — ED Provider Notes (Signed)
[facility] DEPT Provider Note   CSN: [id number] Arrival date & time: 05/12/16  [id number]  By signing my name below, I, Judithe Modest, attest that this documentation has been prepared under the direction and in the presence of Etta Quill, NP. Electronically Signed: Judithe Modest, ER Scribe. 11/30/2015. 11:15 PM.  History   Chief Complaint Chief Complaint  Patient presents with  . Facial Laceration   The history is provided by the patient. No language interpreter was used.    HPI Comments: Cassandra [name] is a 26 y.o. female who presents to the Emergency Department complaining of a laceration to her left outer orbital area that occurred 10.5 hours ago. She was knocked down by a dog and hit her face on a tree. Her tetanus is UTD. She has no chronic medical problems and does not take any regular medications. She denies numbness, weakness, nausea, vomiting or visual changes.    Past Medical History:  Diagnosis Date  . No pertinent past medical history     Patient Active Problem List   Diagnosis Date Noted  . Healthcare maintenance 04/15/2016  . Contraception management 07/26/2013  . Postpartum exam 03/22/2012    Past Surgical History:  Procedure Laterality Date  . wisdom teeth  2009    OB History    Gravida Para Term Preterm AB Living   2 2 2  0 0 2   SAB TAB Ectopic Multiple Live Births   0 0 0 0 2       Home Medications    Prior to Admission medications   Medication Sig Start Date End Date Taking? Authorizing Provider  acetaminophen (TYLENOL) 500 MG tablet Take 500 mg by mouth every 8 (eight) hours as needed for mild pain or moderate pain.    Historical Provider, MD  etonogestrel (IMPLANON) 68 MG IMPL implant Inject 1 each into the skin once. 07/26/13   Historical Provider, MD  ibuprofen (ADVIL,MOTRIN) 600 MG tablet Take 1 tablet (600 mg total) by mouth every 6 (six) hours. 05/29/13   Frazier Richards, MD    Family History Family History  Problem Relation Age  of Onset  . Asthma Mother   . Diabetes Mother   . Asthma Sister   . Asthma Brother   . Cancer Maternal Uncle   . Hypertension Paternal Grandfather     Social History Social History  Substance Use Topics  . Smoking status: Never Smoker  . Smokeless tobacco: Never Used  . Alcohol use No     Allergies   Patient has no known allergies.   Review of Systems Review of Systems  Eyes: Negative for visual disturbance.  Gastrointestinal: Negative for nausea and vomiting.  Skin: Positive for color change and wound.  Neurological: Negative for weakness and numbness.  All other systems reviewed and are negative.   Physical Exam Updated Vital Signs BP 131/80 (BP Location: Right Arm)   Pulse 64   Temp 98.8 F (37.1 C) (Oral)   Resp 16   Ht 5\' 4"  (1.626 m)   Wt 136 lb (61.7 kg)   SpO2 100%   BMI 23.34 kg/m   Physical Exam  Constitutional: She is oriented to person, place, and time. She appears well-developed and well-nourished. No distress.  HENT:  Head: Normocephalic and atraumatic.  Eyes: Pupils are equal, round, and reactive to light.  Neck: Neck supple.  Cardiovascular: Normal rate.   Pulmonary/Chest: Effort normal. No respiratory distress.  Musculoskeletal: Normal range of motion.  Neurological: She is  alert and oriented to person, place, and time. Coordination normal.  Skin: Skin is warm and dry. She is not diaphoretic.  .6cm laceration to the out aspect of the left orbit. Soft tissue swelling along the lower orbital ridge.   Psychiatric: She has a normal mood and affect. Her behavior is normal.  Nursing note and vitals reviewed.    ED Treatments / Results  DIAGNOSTIC STUDIES: Oxygen Saturation is 100% on RA, normal by my interpretation.    COORDINATION OF CARE: 10:26 PM Discussed treatment plan with pt at bedside and pt agreed to plan.  Labs (all labs ordered are listed, but only abnormal results are displayed) Labs Reviewed - No data to display  EKG   EKG Interpretation None       Radiology No results found.  Procedures Procedures (including critical care time) LACERATION REPAIR Performed by: Norman Herrlich Authorized by: Norman Herrlich Consent: Verbal consent obtained. Risks and benefits: risks, benefits and alternatives were discussed Consent given by: patient Patient identity confirmed: provided demographic data Prepped and Draped in normal sterile fashion Wound explored  Laceration Location: left outer aspect of orbit  Laceration Length: 0.6 cm  No Foreign Bodies seen or palpated  Anesthesia: local infiltration  Local anesthetic: lidocaine 1%   Anesthetic total: 0.5 ml  Irrigation method: syringe Amount of cleaning: standard  Skin closure: 6.0 prolene  Number of sutures: 2  Technique: simple interrupted  Patient tolerance: Patient tolerated the procedure well with no immediate complications. Medications Ordered in ED Medications  lidocaine (PF) (XYLOCAINE) 1 % injection 2 mL (2 mLs Infiltration Given 05/12/16 2237)     Initial Impression / Assessment and Plan / ED Course  I have reviewed the triage vital signs and the nursing notes.  Pertinent labs & imaging results that were available during my care of the patient were reviewed by me and considered in my medical decision making (see chart for details).     Tetanus UTD. Laceration occurred < 12 hours prior to repair. Discussed laceration care with pt and answered questions. Pt to f-u for suture removal in 5-7 days and wound check sooner should there be signs of dehiscence or infection. Pt is hemodynamically stable with no complaints prior to dc.     Final Clinical Impressions(s) / ED Diagnoses   Final diagnoses:  Facial laceration, initial encounter    New Prescriptions New Prescriptions   No medications on file     I personally performed the services described in this documentation, which was scribed in my presence. The recorded  information has been reviewed and is accurate.        Etta Quill, NP 05/13/16 0140    Drenda Freeze, MD 05/13/16 [phone]

## 2016-05-12 NOTE — ED Triage Notes (Signed)
Running away from a dog and pushed patient into a tree. Tree branch hit patient left side of face. Bandage placed prior to arrival intact. Denies LOC Alert answering and following commands appropriate.

## 2016-05-22 ENCOUNTER — Ambulatory Visit (INDEPENDENT_AMBULATORY_CARE_PROVIDER_SITE_OTHER): Payer: Self-pay | Admitting: *Deleted

## 2016-05-22 DIAGNOSIS — Z4802 Encounter for removal of sutures: Secondary | ICD-10-CM

## 2016-05-22 NOTE — Progress Notes (Signed)
   Patient was seen in ED on 05/13/16 for laceration to left orbital area.  Area non-tender, no redness or swelling noticed.  Patient denies any pain or visual problems.  Two sutures are placed.  Area cleaned Betadine and alcohol swabs.  Two sutures were removed without difficultly.  Advised patient on return precautions.  Patient to follow up with PCP as needed.  Derl Barrow, RN

## 2016-05-25 ENCOUNTER — Ambulatory Visit: Payer: Medicaid Other | Admitting: Family Medicine

## 2016-07-21 ENCOUNTER — Ambulatory Visit: Payer: Medicaid Other | Admitting: Family Medicine

## 2016-07-21 ENCOUNTER — Encounter: Payer: Self-pay | Admitting: Family Medicine

## 2016-07-21 ENCOUNTER — Ambulatory Visit (INDEPENDENT_AMBULATORY_CARE_PROVIDER_SITE_OTHER): Payer: Medicaid Other | Admitting: Family Medicine

## 2016-07-21 VITALS — BP 120/60 | HR 55 | Temp 98.3°F | Ht 64.0 in | Wt 131.0 lb

## 2016-07-21 DIAGNOSIS — Z3046 Encounter for surveillance of implantable subdermal contraceptive: Secondary | ICD-10-CM | POA: Diagnosis not present

## 2016-07-21 DIAGNOSIS — Z3009 Encounter for other general counseling and advice on contraception: Secondary | ICD-10-CM

## 2016-07-21 DIAGNOSIS — Z30017 Encounter for initial prescription of implantable subdermal contraceptive: Secondary | ICD-10-CM

## 2016-07-21 LAB — POCT URINE PREGNANCY: PREG TEST UR: NEGATIVE

## 2016-07-21 NOTE — Progress Notes (Signed)
Procedure Note: Nexplanon removal and re-insertion  Cassandra [name] is a 26 y.o. year old African American female here for Nexplanon removal and re-insertion.  Patient's last menstrual period was 07/07/2016 (approximate).  Her pregnancy test today was negative.  Risks/benefits/side effects of Nexplanon have been discussed and her questions have been answered.  Specifically, a failure rate of 04/998 has been reported, with an increased failure rate if pt takes [place] and/or antiseizure medicaitons.  Philbert Riser Eckhart is aware of the common side effect of irregular bleeding, which the incidence of decreases over time.  The patient is place in the supine position. Aseptic conditions are maintained. The rod is located by palpation in L arm. The area is cleaned with antiseptic. 3 cc of 1% lidocaine with epinephrine is injected just underneath the end of the implant closest to the elbow. After firmly pressing down on the end of the implant closer to the axilla a 2-3 mm incision is made with a scalpel. The rod is pushed to the incision site and grasped with a mosquito forceps and gently removed. Blunt dissection was not needed. The patient tolerated the procedure well. The rod was removed in its entirety. A new Nexplanon was inserted without difficulty.  A pressure bandage was applied.  Pt was instructed to remove pressure bandage in a few hours, and keep insertion site covered with a bandaid for 3 days.  Back up contraception was recommended for 7d.  Follow-up scheduled PRN problems   Virginia Crews, MD, MPH PGY-3,  [facility] 07/21/2016 1:57 PM

## 2016-07-21 NOTE — Patient Instructions (Signed)
Etonogestrel implant What is this medicine? ETONOGESTREL (et oh noe JES trel) is a contraceptive (birth control) device. It is used to prevent pregnancy. It can be used for up to 3 years. This medicine may be used for other purposes; ask your health care provider or pharmacist if you have questions. COMMON BRAND NAME(S): Implanon, Nexplanon What should I tell my health care provider before I take this medicine? They need to know if you have any of these conditions: -abnormal vaginal bleeding -blood vessel disease or blood clots -cancer of the breast, cervix, or liver -depression -diabetes -gallbladder disease -headaches -heart disease or recent heart attack -high blood pressure -high cholesterol -kidney disease -liver disease -renal disease -seizures -tobacco smoker -an unusual or allergic reaction to etonogestrel, other hormones, anesthetics or antiseptics, medicines, foods, dyes, or preservatives -pregnant or trying to get pregnant -breast-feeding How should I use this medicine? This device is inserted just under the skin on the inner side of your upper arm by a health care professional. Talk to your pediatrician regarding the use of this medicine in children. Special care may be needed. Overdosage: If you think you have taken too much of this medicine contact a poison control center or emergency room at once. NOTE: This medicine is only for you. Do not share this medicine with others. What if I miss a dose? This does not apply. What may interact with this medicine? Do not take this medicine with any of the following medications: -amprenavir -bosentan -fosamprenavir This medicine may also interact with the following medications: -barbiturate medicines for inducing sleep or treating seizures -certain medicines for fungal infections like ketoconazole and itraconazole -grapefruit juice -griseofulvin -medicines to treat seizures like carbamazepine, felbamate, oxcarbazepine,  phenytoin, topiramate -modafinil -phenylbutazone -rifampin -rufinamide -some medicines to treat HIV infection like atazanavir, indinavir, lopinavir, nelfinavir, tipranavir, ritonavir -St. John's wort This list may not describe all possible interactions. Give your health care provider a list of all the medicines, herbs, non-prescription drugs, or dietary supplements you use. Also tell them if you smoke, drink alcohol, or use illegal drugs. Some items may interact with your medicine. What should I watch for while using this medicine? This product does not protect you against HIV infection (AIDS) or other sexually transmitted diseases. You should be able to feel the implant by pressing your fingertips over the skin where it was inserted. Contact your doctor if you cannot feel the implant, and use a non-hormonal birth control method (such as condoms) until your doctor confirms that the implant is in place. If you feel that the implant may have broken or become bent while in your arm, contact your healthcare provider. What side effects may I notice from receiving this medicine? Side effects that you should report to your doctor or health care professional as soon as possible: -allergic reactions like skin rash, itching or hives, swelling of the face, lips, or tongue -breast lumps -changes in emotions or moods -depressed mood -heavy or prolonged menstrual bleeding -pain, irritation, swelling, or bruising at the insertion site -scar at site of insertion -signs of infection at the insertion site such as fever, and skin redness, pain or discharge -signs of pregnancy -signs and symptoms of a blood clot such as breathing problems; changes in vision; chest pain; severe, sudden headache; pain, swelling, warmth in the leg; trouble speaking; sudden numbness or weakness of the face, arm or leg -signs and symptoms of liver injury like dark yellow or brown urine; general ill feeling or flu-like symptoms;  light-colored   stools; loss of appetite; nausea; right upper belly pain; unusually weak or tired; yellowing of the eyes or skin -unusual vaginal bleeding, discharge -signs and symptoms of a stroke like changes in vision; confusion; trouble speaking or understanding; severe headaches; sudden numbness or weakness of the face, arm or leg; trouble walking; dizziness; loss of balance or coordination Side effects that usually do not require medical attention (report to your doctor or health care professional if they continue or are bothersome): -acne -back pain -breast pain -changes in weight -dizziness -general ill feeling or flu-like symptoms -headache -irregular menstrual bleeding -nausea -sore throat -vaginal irritation or inflammation This list may not describe all possible side effects. Call your doctor for medical advice about side effects. You may report side effects to FDA at 1-800-FDA-1088. Where should I keep my medicine? This drug is given in a hospital or clinic and will not be stored at home. NOTE: This sheet is a summary. It may not cover all possible information. If you have questions about this medicine, talk to your doctor, pharmacist, or health care provider.  2018 Elsevier/Gold Standard (2015-10-24 11:19:22)  

## 2016-07-21 NOTE — Progress Notes (Signed)
   Subjective:   Cassandra [name] is a healthy 26 y.o. female here for Contraceptive management  Patient had Nexplanon placed postpartum 07/26/2013. She is a G2 P2 who is currently sexually active with 1 partner. She does not desire pregnancy now. She's had irregular bleeding and heavy menses since having Nexplanon placed. She was eventually only take OCPs as it would be difficult to remember to take them daily.  The bleeding is tolerable and she wished to have another Nexplanon.  Review of Systems:  Per HPI.   Social History: never smoker  Objective:  There were no vitals taken for this visit.  Gen:  26 y.o. female in NAD HEENT: NCAT, MMM, anicteric sclerae CV: Regular rate Resp: Non-labored Ext: Subcutaneous Nexplanon palpable in left arm approximately 4 cm proximal to the medial epicondyle MSK: No obvious deformities, gait intact Neuro: Alert and oriented, speech normal      Assessment & Plan:     LINN CLAVIN is a 26 y.o. female here for   1. Encounter for other general counseling or advice on contraception - POCT urine pregnancy - negative - Patient counseled on contraceptive options - Nexplanon removed and reinserted as below - patient to follow-up for pap smear as she is due   Virginia Crews, MD MPH PGY-3,  [facility] 07/21/2016  1:54 PM

## 2016-07-27 MED ORDER — ETONOGESTREL 68 MG ~~LOC~~ IMPL
68.0000 mg | DRUG_IMPLANT | Freq: Once | SUBCUTANEOUS | Status: AC
  Administered 2016-07-21: 68 mg via SUBCUTANEOUS

## 2016-07-27 NOTE — Addendum Note (Signed)
Addended by: Christen Bame D on: 07/27/2016 12:00 PM   Modules accepted: Orders

## 2016-07-28 ENCOUNTER — Ambulatory Visit: Payer: Medicaid Other | Admitting: Family Medicine

## 2017-03-09 ENCOUNTER — Encounter (HOSPITAL_COMMUNITY): Payer: Self-pay | Admitting: Emergency Medicine

## 2017-03-09 ENCOUNTER — Emergency Department (HOSPITAL_COMMUNITY)
Admission: EM | Admit: 2017-03-09 | Discharge: 2017-03-09 | Disposition: A | Discharge status: Home / Self Care | Payer: Self-pay | Attending: Emergency Medicine | Admitting: Emergency Medicine

## 2017-03-09 ENCOUNTER — Other Ambulatory Visit: Payer: Self-pay

## 2017-03-09 DIAGNOSIS — Y999 Unspecified external cause status: Secondary | ICD-10-CM | POA: Insufficient documentation

## 2017-03-09 DIAGNOSIS — S93401A Sprain of unspecified ligament of right ankle, initial encounter: Secondary | ICD-10-CM | POA: Insufficient documentation

## 2017-03-09 DIAGNOSIS — Y939 Activity, unspecified: Secondary | ICD-10-CM | POA: Insufficient documentation

## 2017-03-09 DIAGNOSIS — X58XXXA Exposure to other specified factors, initial encounter: Secondary | ICD-10-CM | POA: Insufficient documentation

## 2017-03-09 DIAGNOSIS — Y929 Unspecified place or not applicable: Secondary | ICD-10-CM | POA: Insufficient documentation

## 2017-03-09 MED ORDER — IBUPROFEN 600 MG PO TABS: 600.0000 mg | ORAL_TABLET | Freq: Four times a day (QID) | ORAL | 0 refills | Status: DC | PRN

## 2017-03-09 NOTE — ED Provider Notes (Signed)
[facility] EMERGENCY DEPARTMENT Provider Note   CSN: [id number] Arrival date & time: 03/09/17  1744     History   Chief Complaint Chief Complaint  Patient presents with  . Ankle Pain    HPI Cassandra [name] is a 26 y.o. female.  HPI  26 y.o. female, presents to the Emergency Department today due to right ankle pain. Notes on and off since the summer. States while at work today she was walking and felt a "twinge" in her right ankle on the outside. Denies trauma to area. No falls. Notes pain intermittent and worse with ambulation. No numbness/tingling. Rates pain 4/10. Throbbing. No other symptoms noted   Past Medical History:  Diagnosis Date  . No pertinent past medical history     Patient Active Problem List   Diagnosis Date Noted  . Healthcare maintenance 04/15/2016  . Contraception management 07/26/2013    Past Surgical History:  Procedure Laterality Date  . wisdom teeth  2009    OB History    Gravida Para Term Preterm AB Living   2 2 2  0 0 2   SAB TAB Ectopic Multiple Live Births   0 0 0 0 2       Home Medications    Prior to Admission medications   Medication Sig Start Date End Date Taking? Authorizing Provider  acetaminophen (TYLENOL) 500 MG tablet Take 500 mg by mouth every 8 (eight) hours as needed for mild pain or moderate pain.    [provider]  etonogestrel (IMPLANON) 68 MG IMPL implant Inject 1 each into the skin once. 07/26/13   [provider]  ibuprofen (ADVIL,MOTRIN) 600 MG tablet Take 1 tablet (600 mg total) by mouth every 6 (six) hours. 05/29/13   Frazier Richards, MD    Family History Family History  Problem Relation Age of Onset  . Asthma Mother   . Diabetes Mother   . Asthma Sister   . Asthma Brother   . Cancer Maternal Uncle   . Hypertension Paternal Grandfather     Social History Social History   Tobacco Use  . Smoking status: Never Smoker  . Smokeless tobacco: Never Used  Substance  Use Topics  . Alcohol use: No  . Drug use: No     Allergies   Patient has no known allergies.   Review of Systems Review of Systems ROS reviewed and all are negative for acute change except as noted in the HPI.  Physical Exam Updated Vital Signs BP 128/86 (BP Location: Right Arm)   Pulse 76   Temp 98.8 F (37.1 C) (Oral)   Resp 16   LMP 03/09/2017   SpO2 100%   Physical Exam  Constitutional: She is oriented to person, place, and time. Vital signs are normal. She appears well-developed and well-nourished.  HENT:  Head: Normocephalic and atraumatic.  Right Ear: Hearing normal.  Left Ear: Hearing normal.  Eyes: Conjunctivae and EOM are normal. Pupils are equal, round, and reactive to light.  Neck: Normal range of motion. Neck supple.  Cardiovascular: Normal rate and regular rhythm.  Pulmonary/Chest: Effort normal.  Musculoskeletal: Normal range of motion.  TTP along right lateral malleolus as well as below. No palpable or visible deformities. No swelling. ROM intact without too much discomfort. Able to ambulate well NVI  Neurological: She is alert and oriented to person, place, and time.  Skin: Skin is warm and dry.  Psychiatric: She has a normal mood and affect. Her speech is  normal and behavior is normal. Thought content normal.  Nursing note and vitals reviewed.    ED Treatments / Results  Labs (all labs ordered are listed, but only abnormal results are displayed) Labs Reviewed - No data to display  EKG  EKG Interpretation None       Radiology No results found.  Procedures Procedures (including critical care time)  Medications Ordered in ED Medications - No data to display   Initial Impression / Assessment and Plan / ED Course  I have reviewed the triage vital signs and the nursing notes.  Pertinent labs & imaging results that were available during my care of the patient were reviewed by me and considered in my medical decision making (see chart for  details).  Final Clinical Impressions(s) / ED Diagnoses   {I have reviewed and evaluated the relevant imaging studies.  {I have reviewed the relevant previous healthcare records.  {I obtained HPI from historian.   ED Course:  Assessment: Pt is a 26 y.o. female presents to the Emergency Department today due to right ankle pain. Notes on and off since the summer. States while at work today she was walking and felt a "twinge" in her right ankle on the outside. Denies trauma to area. No falls. Notes pain intermittent and worse with ambulation. No numbness/tingling. Rates pain 4/10. Throbbing. On exam, pt in NAD. Nontoxic/nonseptic appearing. VSS. Afebrile. TTP along right lateral malleolus as well as below. No palpable or visible deformities. No swelling. ROM intact without too much discomfort. Able to ambulate well NVI. Given no trauma or injury to area. Xray likely not beneficial. Likely sprain. Given Brace in ED. Plan is to [facility] with follow up to PCP. At time of discharge, Patient is in no acute distress. Vital Signs are stable. Patient is able to ambulate. Patient able to tolerate PO.   Disposition/Plan:  DC Home Additional Verbal discharge instructions given and discussed with patient.  Pt Instructed to f/u with PCP in the next week for evaluation and treatment of symptoms. Return precautions given Pt acknowledges and agrees with plan  Supervising Physician [place], Grayce Sessions, *  Final diagnoses:  Sprain of right ankle, unspecified ligament, initial encounter    ED Discharge Orders    None       Shary Decamp, PA-C 03/09/17 1852    Fatima Blank, MD 03/10/17 [phone]

## 2017-03-09 NOTE — Discharge Instructions (Signed)
Please read and follow all provided instructions.  Your diagnoses today include:  1. Sprain of right ankle, unspecified ligament, initial encounter     Tests performed today include: Vital signs. See below for your results today.   Medications prescribed:  Take as prescribed   Home care instructions:  Follow any educational materials contained in this packet.  Follow-up instructions: Please follow-up with your primary care provider for further evaluation of symptoms and treatment   Return instructions:  Please return to the Emergency Department if you do not get better, if you get worse, or new symptoms OR  - Fever (temperature greater than 101.46F)  - Bleeding that does not stop with holding pressure to the area    -Severe pain (please note that you may be more sore the day after your accident)  - Chest Pain  - Difficulty breathing  - Severe nausea or vomiting  - Inability to tolerate food and liquids  - Passing out  - Skin becoming red around your wounds  - Change in mental status (confusion or lethargy)  - New numbness or weakness    Please return if you have any other emergent concerns.  Additional Information:  Your vital signs today were: BP 128/86 (BP Location: Right Arm)    Pulse 76    Temp 98.8 F (37.1 C) (Oral)    Resp 16    LMP 03/09/2017    SpO2 100%  If your blood pressure (BP) was elevated above 135/85 this visit, please have this repeated by your doctor within one month. ---------------

## 2017-03-09 NOTE — ED Triage Notes (Signed)
Pt reports injuring her R ankle back in the summer, states she heard a pop, ankle pain has been off and on, today at work she started having trouble bearing weight.

## 2017-04-05 ENCOUNTER — Emergency Department (HOSPITAL_COMMUNITY): Payer: Self-pay

## 2017-04-05 ENCOUNTER — Emergency Department (HOSPITAL_COMMUNITY)
Admission: EM | Admit: 2017-04-05 | Discharge: 2017-04-05 | Disposition: A | Discharge status: Home / Self Care | Payer: Self-pay | Attending: Emergency Medicine | Admitting: Emergency Medicine

## 2017-04-05 ENCOUNTER — Other Ambulatory Visit: Payer: Self-pay

## 2017-04-05 ENCOUNTER — Encounter (HOSPITAL_COMMUNITY): Payer: Self-pay | Admitting: Emergency Medicine

## 2017-04-05 DIAGNOSIS — M79671 Pain in right foot: Secondary | ICD-10-CM | POA: Insufficient documentation

## 2017-04-05 MED ORDER — ACETAMINOPHEN 500 MG PO TABS: 500.0000 mg | ORAL_TABLET | Freq: Three times a day (TID) | ORAL | 0 refills | Status: DC | PRN

## 2017-04-05 MED ORDER — IBUPROFEN 600 MG PO TABS: 600.0000 mg | ORAL_TABLET | Freq: Four times a day (QID) | ORAL | 0 refills | Status: DC | PRN

## 2017-04-05 NOTE — ED Triage Notes (Signed)
Pt states she rolled her rt ankle in July. Pt states it has hurt intermittently since then. Pt has been seen for the same. Pt complains of slight swelling to top of foot. Pt is ambulatory.

## 2017-04-05 NOTE — Discharge Instructions (Signed)
Medications: ibuprofen, Tylenol  Treatment: Take ibuprofen every 6 hours as needed for your pain.  You can alternate with Tylenol as well.  Elevate your foot whenever you are not walking on it.  Use ice 3-4 times daily alternating 20 minutes on, 20 minutes off.  Stay off your feet as much as possible to help your foot pain and swelling improve.  Follow-up: Please follow-up with the podiatrist, Dr. Amalia Hailey, for further evaluation and treatment of your symptoms.  Please return to the emergency department if you develop any new or worsening symptoms.

## 2017-04-05 NOTE — ED Provider Notes (Signed)
[facility] EMERGENCY DEPARTMENT Provider Note   CSN: [id number] Arrival date & time: 04/05/17  1125     History   Chief Complaint Chief Complaint  Patient presents with  . Foot Pain    HPI Cassandra [name] is a 26 y.o. female who presents with right foot pain that has been hurting intermittently since July 2018.  Patient reports she rolled her ankle at that time and has been wearing the ASO splint that was given to her when she was evaluated November, however swelling has increased.  Patient is on her feet a lot for her job, she works at [employer].  She has not been able to take any rest.  She has not taken any medicines at home or use any ice.  She denies any fevers.  She denies any numbness or tingling.  HPI  Past Medical History:  Diagnosis Date  . No pertinent past medical history     Patient Active Problem List   Diagnosis Date Noted  . Healthcare maintenance 04/15/2016  . Contraception management 07/26/2013    Past Surgical History:  Procedure Laterality Date  . wisdom teeth  2009    OB History    Gravida Para Term Preterm AB Living   2 2 2  0 0 2   SAB TAB Ectopic Multiple Live Births   0 0 0 0 2       Home Medications    Prior to Admission medications   Medication Sig Start Date End Date Taking? Authorizing Provider  acetaminophen (TYLENOL) 500 MG tablet Take 1 tablet (500 mg total) by mouth every 8 (eight) hours as needed for mild pain or moderate pain. 04/05/17   Keyetta Hollingworth, Bea Graff, PA-C  etonogestrel (IMPLANON) 68 MG IMPL implant Inject 1 each into the skin once. 07/26/13   [provider]  ibuprofen (ADVIL,MOTRIN) 600 MG tablet Take 1 tablet (600 mg total) by mouth every 6 (six) hours as needed. 04/05/17   Frederica Kuster, PA-C    Family History Family History  Problem Relation Age of Onset  . Asthma Mother   . Diabetes Mother   . Asthma Sister   . Asthma Brother   . Cancer Maternal Uncle   . Hypertension Paternal  Grandfather     Social History Social History   Tobacco Use  . Smoking status: Never Smoker  . Smokeless tobacco: Never Used  Substance Use Topics  . Alcohol use: No  . Drug use: No     Allergies   Patient has no known allergies.   Review of Systems Review of Systems  Constitutional: Negative for fever.  Musculoskeletal: Positive for arthralgias (R foot) and joint swelling (R foot).     Physical Exam Updated Vital Signs BP 134/80 (BP Location: Right Arm)   Pulse 81   Temp 97.8 F (36.6 C) (Oral)   Resp 16   LMP 04/05/2017   SpO2 100%   Physical Exam  Constitutional: She appears well-developed and well-nourished. No distress.  HENT:  Head: Normocephalic and atraumatic.  Mouth/Throat: Oropharynx is clear and moist. No oropharyngeal exudate.  Eyes: Conjunctivae are normal. Pupils are equal, round, and reactive to light. Right eye exhibits no discharge. Left eye exhibits no discharge. No scleral icterus.  Neck: Normal range of motion. Neck supple. No thyromegaly present.  Cardiovascular: Normal rate, regular rhythm, normal heart sounds and intact distal pulses. Exam reveals no gallop and no friction rub.  No murmur heard. Pulmonary/Chest: Effort normal and breath sounds  normal. No stridor. No respiratory distress. She has no wheezes. She has no rales.  Musculoskeletal: She exhibits no edema.       Right foot: There is tenderness, bony tenderness and swelling. There is normal range of motion and normal capillary refill.       Feet:  R foot: Edema noted, no erythema and or warmth; full range of motion without difficulty; no bony tenderness over the ankle he  Lymphadenopathy:    She has no cervical adenopathy.  Neurological: She is alert. Coordination normal.  Skin: Skin is warm and dry. No rash noted. She is not diaphoretic. No pallor.  Psychiatric: She has a normal mood and affect.  Nursing note and vitals reviewed.    ED Treatments / Results  Labs (all labs  ordered are listed, but only abnormal results are displayed) Labs Reviewed - No data to display  EKG  EKG Interpretation None       Radiology Dg Foot Complete Right  Result Date: 04/05/2017 CLINICAL DATA:  Foot swelling, no injury EXAM: RIGHT FOOT COMPLETE - 3+ VIEW COMPARISON:  None. FINDINGS: No fracture or dislocation is seen. The joint spaces are preserved. The visualized soft tissues are unremarkable. IMPRESSION: Negative. Electronically Signed   By: Julian Hy M.D.   On: 04/05/2017 12:36    Procedures Procedures (including critical care time)  Medications Ordered in ED Medications - No data to display   Initial Impression / Assessment and Plan / ED Course  I have reviewed the triage vital signs and the nursing notes.  Pertinent labs & imaging results that were available during my care of the patient were reviewed by me and considered in my medical decision making (see chart for details).     X-ray of the foot is negative.  I feel patient may have continue pain from previous sprain, she has not given it much rest.  Patient recommended to take a few days off of her feet.  She is given postop shoe for support as well.  No signs of infection at this time.  Supportive treatment discussed including elevation, ice.  Follow-up to podiatry for further evaluation and treatment of pain.  Return precautions discussed.  Patient understands and agrees with plan.  Patient vitals stable throughout ED course and discharged in satisfactory condition.  Final Clinical Impressions(s) / ED Diagnoses   Final diagnoses:  Foot pain, right    ED Discharge Orders        Ordered    ibuprofen (ADVIL,MOTRIN) 600 MG tablet  Every 6 hours PRN     04/05/17 1416    acetaminophen (TYLENOL) 500 MG tablet  Every 8 hours PRN     04/05/17 1416       Frederica Kuster, PA-C 04/05/17 1419    Malvin Johns, MD 04/05/17 1432

## 2017-04-07 ENCOUNTER — Ambulatory Visit: Payer: Medicaid Other | Admitting: Internal Medicine

## 2017-04-27 ENCOUNTER — Encounter (HOSPITAL_COMMUNITY): Payer: Self-pay | Admitting: Emergency Medicine

## 2017-04-27 ENCOUNTER — Emergency Department (HOSPITAL_COMMUNITY)
Admission: EM | Admit: 2017-04-27 | Discharge: 2017-04-27 | Disposition: A | Discharge status: Home / Self Care | Payer: Medicaid Other | Attending: Emergency Medicine | Admitting: Emergency Medicine

## 2017-04-27 DIAGNOSIS — K120 Recurrent oral aphthae: Secondary | ICD-10-CM | POA: Insufficient documentation

## 2017-04-27 DIAGNOSIS — K0889 Other specified disorders of teeth and supporting structures: Secondary | ICD-10-CM | POA: Insufficient documentation

## 2017-04-27 DIAGNOSIS — K121 Other forms of stomatitis: Secondary | ICD-10-CM

## 2017-04-27 DIAGNOSIS — Z79899 Other long term (current) drug therapy: Secondary | ICD-10-CM | POA: Insufficient documentation

## 2017-04-27 MED ORDER — IBUPROFEN 200 MG PO TABS
400.0000 mg | ORAL_TABLET | Freq: Once | ORAL | Status: AC | PRN
  Administered 2017-04-27: 400 mg via ORAL
  Filled 2017-04-27: qty 2

## 2017-04-27 MED ORDER — PENICILLIN V POTASSIUM 500 MG PO TABS
500.0000 mg | ORAL_TABLET | Freq: Once | ORAL | Status: AC
  Administered 2017-04-27: 500 mg via ORAL
  Filled 2017-04-27: qty 1

## 2017-04-27 MED ORDER — CHLORHEXIDINE GLUCONATE 0.12 % MT SOLN: 15.0000 mL | Freq: Two times a day (BID) | OROMUCOSAL | 0 refills | Status: DC

## 2017-04-27 MED ORDER — LIDOCAINE VISCOUS 2 % MT SOLN: 10.0000 mL | Freq: Two times a day (BID) | OROMUCOSAL | 0 refills | Status: DC | PRN

## 2017-04-27 MED ORDER — HYDROCODONE-ACETAMINOPHEN 5-325 MG PO TABS
1.0000 | ORAL_TABLET | Freq: Once | ORAL | Status: AC
  Administered 2017-04-27: 1 via ORAL
  Filled 2017-04-27: qty 1

## 2017-04-27 MED ORDER — CHLORHEXIDINE GLUCONATE 0.12 % MT SOLN
15.0000 mL | Freq: Once | OROMUCOSAL | Status: AC
  Administered 2017-04-27: 15 mL via OROMUCOSAL
  Filled 2017-04-27: qty 15

## 2017-04-27 MED ORDER — PENICILLIN V POTASSIUM 500 MG PO TABS: 500.0000 mg | ORAL_TABLET | Freq: Four times a day (QID) | ORAL | 0 refills | Status: AC

## 2017-04-27 NOTE — ED Triage Notes (Signed)
Patient c/o lower left sided dental pain onset of last night. Pt reports taking ibuprofen, orajel and tylenol with no relief. Redness noted to gum area.

## 2017-04-27 NOTE — Discharge Instructions (Signed)
Please take all of your antibiotics until finished!   You may develop abdominal discomfort or diarrhea from the antibiotic.  You may help offset this with probiotics which you can buy or get in yogurt. Do not eat  or take the probiotics until 2 hours after your antibiotic.   Apply warm compresses to jaw throughout the day. Alternate 600 mg of ibuprofen and [phone] mg of Tylenol every 3 hours as needed for pain. Do not exceed 4000 mg of Tylenol daily.  You may also use warm water salt gargles, Orajel, or other over-the-counter dental pain remedies. Use Peridex mouthwash twice daily.  Apply viscous lidocaine swish and spit twice daily for the pain from the ulcer.  Followup with a dentist is very important for ongoing evaluation and management of recurrent dental pain. Return to emergency department for emergent changing or worsening symptoms such as fever, worsening facial swelling, difficulty breathing or swallowing, throat tightness, or vision changes.

## 2017-04-27 NOTE — ED Provider Notes (Signed)
[facility] DEPT Provider Note   CSN: [id number] Arrival date & time: 04/27/17  1510     History   Chief Complaint Chief Complaint  Patient presents with  . Dental Pain    HPI Cassandra [name] is a 27 y.o. female presents today with chief complaint acute onset, constant left lower dental pain since last night.  Pain is sharp, aching, and throbbing with radiation up to the left side of the face.  She endorses sensitivity to hot foods and cold foods and air and notes an area of ulceration to her gums as well.  She denies facial swelling, drooling, or throat tightness.  She is able to tolerate p.o. food and fluids without difficulty.  Denies fevers or chills.  She has tried Orajel, ibuprofen, and Tylenol without significant relief of her symptoms.  She does not currently have a dentist.  The history is provided by the patient.    Past Medical History:  Diagnosis Date  . No pertinent past medical history     Patient Active Problem List   Diagnosis Date Noted  . Healthcare maintenance 04/15/2016  . Contraception management 07/26/2013    Past Surgical History:  Procedure Laterality Date  . wisdom teeth  2009    OB History    Gravida Para Term Preterm AB Living   2 2 2  0 0 2   SAB TAB Ectopic Multiple Live Births   0 0 0 0 2       Home Medications    Prior to Admission medications   Medication Sig Start Date End Date Taking? Authorizing Provider  acetaminophen (TYLENOL) 500 MG tablet Take 1 tablet (500 mg total) by mouth every 8 (eight) hours as needed for mild pain or moderate pain. 04/05/17   Law, Bea Graff, PA-C  etonogestrel (IMPLANON) 68 MG IMPL implant Inject 1 each into the skin once. 07/26/13   [provider]  ibuprofen (ADVIL,MOTRIN) 600 MG tablet Take 1 tablet (600 mg total) by mouth every 6 (six) hours as needed. 04/05/17   Law, Bea Graff, PA-C  lidocaine (XYLOCAINE) 2 % solution Use as directed 10 mLs in the mouth  or throat 2 (two) times daily as needed for mouth pain. 04/27/17   Tyleek Smick A, PA-C  penicillin v potassium (VEETID) 500 MG tablet Take 1 tablet (500 mg total) by mouth 4 (four) times daily for 7 days. 04/27/17 05/04/17  Renita Papa, PA-C    Family History Family History  Problem Relation Age of Onset  . Asthma Mother   . Diabetes Mother   . Asthma Sister   . Asthma Brother   . Cancer Maternal Uncle   . Hypertension Paternal Grandfather     Social History Social History   Tobacco Use  . Smoking status: Never Smoker  . Smokeless tobacco: Never Used  Substance Use Topics  . Alcohol use: No  . Drug use: No     Allergies   Patient has no known allergies.   Review of Systems Review of Systems  Constitutional: Negative for chills and fever.  HENT: Positive for dental problem. Negative for facial swelling, trouble swallowing and voice change.   Respiratory: Negative for shortness of breath.      Physical Exam Updated Vital Signs BP (!) 139/98 (BP Location: Left Arm)   Pulse 67   Temp 98.5 F (36.9 C) (Oral)   Resp 18   LMP 04/05/2017   SpO2 100%   Physical Exam  Constitutional:  She appears well-developed and well-nourished. No distress.  Tearful, appears uncomfortable sitting in chair  HENT:  Head: Normocephalic and atraumatic.  Right Ear: External ear normal.  Left Ear: External ear normal.  Mouth/Throat:    No trismus or sublingual abnormalities.  No facial swelling.  No uvular deviation.  Posterior oropharynx is unremarkable.  Missing dentition of the left lower canine with an overlying superficial ulceration of the gingiva in this area.  There is tenderness to percussion of the mandible in this area with no significant fluctuance, swelling, or drainage.  Moderate gingival irritation noted.  Eyes: Conjunctivae and EOM are normal. Pupils are equal, round, and reactive to light. Right eye exhibits no discharge. Left eye exhibits no discharge.  Neck: Normal range  of motion. Neck supple. No JVD present. No tracheal deviation present.  Cardiovascular: Normal rate.  Pulmonary/Chest: Effort normal.  Abdominal: She exhibits no distension.  Musculoskeletal: She exhibits no edema.  Lymphadenopathy:    She has no cervical adenopathy.  Neurological: She is alert.  Skin: Skin is warm and dry. No erythema.  Psychiatric: She has a normal mood and affect. Her behavior is normal.  Nursing note and vitals reviewed.    ED Treatments / Results  Labs (all labs ordered are listed, but only abnormal results are displayed) Labs Reviewed - No data to display  EKG  EKG Interpretation None       Radiology No results found.  Procedures Procedures (including critical care time)  Medications Ordered in ED Medications  HYDROcodone-acetaminophen (NORCO/VICODIN) 5-325 MG per tablet 1 tablet (not administered)  penicillin v potassium (VEETID) tablet 500 mg (not administered)  chlorhexidine (PERIDEX) 0.12 % solution 15 mL (not administered)  ibuprofen (ADVIL,MOTRIN) tablet 400 mg (400 mg Oral Given 04/27/17 1536)     Initial Impression / Assessment and Plan / ED Course  I have reviewed the triage vital signs and the nursing notes.  Pertinent labs & imaging results that were available during my care of the patient were reviewed by me and considered in my medical decision making (see chart for details).    Patient with toothache.  No gross abscess.  Also has an ulceration of the gingiva overlying an area where there is a missing tooth.  She is afebrile, vital signs are stable, and she is nontoxic in appearance.  Exam unconcerning for Ludwig's angina or spread of infection.  Will treat with penicillin and pain medicine.  Will send home with viscous lidocaine for mouth pain/ulceration.  Urged patient to follow-up with dentist, and she was given outpatient resources for dental follow-up.  She was given 1 dose of pain medicine in the ED as she appears quite  uncomfortable.  Discussed indications for return to the ED. Pt verbalized understanding of and agreement with plan and is safe for discharge home at this time.  No complaints prior to discharge.   Final Clinical Impressions(s) / ED Diagnoses   Final diagnoses:  Pain, dental  Mouth ulcer    ED Discharge Orders        Ordered    penicillin v potassium (VEETID) 500 MG tablet  4 times daily     04/27/17 1735    lidocaine (XYLOCAINE) 2 % solution  2 times daily PRN     04/27/17 1735       Renita Papa, PA-C 04/27/17 1756    Charlesetta Shanks, MD 05/04/17 1530

## 2017-11-16 ENCOUNTER — Telehealth: Payer: Self-pay | Admitting: Family Medicine

## 2017-11-16 NOTE — Telephone Encounter (Signed)
Phone number in chart does not work. Please schedule patient with PCP for health maintenance. AR

## 2018-02-16 ENCOUNTER — Other Ambulatory Visit: Payer: Self-pay

## 2018-02-16 ENCOUNTER — Ambulatory Visit (INDEPENDENT_AMBULATORY_CARE_PROVIDER_SITE_OTHER): Payer: Medicaid Other | Admitting: Family Medicine

## 2018-02-16 ENCOUNTER — Encounter: Payer: Self-pay | Admitting: Family Medicine

## 2018-02-16 VITALS — BP 110/70 | HR 55 | Temp 98.4°F | Wt 140.0 lb

## 2018-02-16 DIAGNOSIS — Z3046 Encounter for surveillance of implantable subdermal contraceptive: Secondary | ICD-10-CM | POA: Diagnosis not present

## 2018-02-22 NOTE — Progress Notes (Signed)
Nexplanon Removal Procedure Note PRE-OP DIAGNOSIS: Nexplanon, desire for change of contraception  POST-OP DIAGNOSIS: Same  PROCEDURE: Nexplanon Removal  Performing Physician: Dr. Grandville Silos  PROCEDURE:  Anesthesia: 2% Lidocaine w/ epinephrine 5 ml  Procedure: Consent obtained. A time-out was performed prior to initiating procedure to be sure of right patient and right location. The area surrounding the Nexplanon was prepared with betadine and draped in the usual sterile manner. The site was anesthetized with lidocaine. A skin incision was made over the distal aspect of the device. The capsule lysed sharply and the device removed using a hemostat. Hemostasis was assured. The site was dressed with a pressure dressing. The patient tolerated the procedure well.   Followup: The patient tolerated the procedure well without complications. Standard post-procedure care is explained and return precautions are given. Contraception was advised until conception is desired.

## 2018-04-20 NOTE — L&D Delivery Note (Addendum)
[facility] Delivery Note  Cassandra [name] is a 28 y.o. G3P3003 s/p NSVD at [redacted]w[redacted]d. She was admitted for IOL for postdates.   ROM: 2h 18m with clear fluid GBS Status:  Negative/-- (10/09 1015) Maximum Maternal Temperature:  Temp (24hrs), Avg:98.4 F (36.9 C), Min:98 F (36.7 C), Max:98.7 F (37.1 C)  Labor Progress: . Patient arrived at 1.5 cm dilation and was induced with Cytotec.   Delivery Date/Time: 02/25/2019 at 1630 Delivery: Called to room and patient was complete and pushing. Head delivered in LOA position.  Loose nuchal was present and reduced. Shoulder and body delivered in usual fashion. Infant with spontaneous cry, placed on mother's abdomen, dried and stimulated. Cord clamped x 2 after 1-minute delay, and cut by father of the baby under my direct supervision. Cord blood drawn. Placenta delivered spontaneously with gentle cord traction. Fundus firm with massage and Pitocin. Labia, perineum, vagina, and cervix inspected with bilateral periurethral tears which were hemostatic and one first-degree perineal body tear which was also hemostatic.  Placenta: Spontaneous, intact, three-vessel cord Complications: None Lacerations: Bilateral periurethral tears, first-degree perineal body tear which was hemostatic EBL: 118 Analgesia: Epidural in place  Infant: APGAR (1 MIN):  8 APGAR (5 MINS):  9  Weight: Pending   Gifford Shave, MD  PGY-1, [facility]  02/25/2019 4:46 PM  Patient is a 28 y.o. at [redacted]w[redacted]d who was admitted for postdates IOL, uncomplicated prenatal course.  She progressed with augmentation via cytotec x2. Called to bedside for repeated variable decelerations and patient progressed rapidly from 8cm to complete. Started pushing immediately.   I was gloved and present for delivery in its entirety.  Second stage of labor progressed, baby delivered after contractions.   Complications: loose nuchal x1  Wende Mott, CNM 4:54 PM

## 2018-06-28 ENCOUNTER — Inpatient Hospital Stay (HOSPITAL_COMMUNITY)
Admission: AD | Admit: 2018-06-28 | Discharge: 2018-06-28 | Disposition: A | Discharge status: Home / Self Care | Payer: Medicaid Other | Attending: Obstetrics and Gynecology | Admitting: Obstetrics and Gynecology

## 2018-06-28 ENCOUNTER — Inpatient Hospital Stay (HOSPITAL_COMMUNITY): Payer: Medicaid Other

## 2018-06-28 ENCOUNTER — Encounter (HOSPITAL_COMMUNITY): Payer: Self-pay | Admitting: *Deleted

## 2018-06-28 ENCOUNTER — Other Ambulatory Visit: Payer: Self-pay

## 2018-06-28 DIAGNOSIS — Z79899 Other long term (current) drug therapy: Secondary | ICD-10-CM | POA: Insufficient documentation

## 2018-06-28 DIAGNOSIS — R1031 Right lower quadrant pain: Secondary | ICD-10-CM | POA: Insufficient documentation

## 2018-06-28 DIAGNOSIS — O9989 Other specified diseases and conditions complicating pregnancy, childbirth and the puerperium: Secondary | ICD-10-CM | POA: Insufficient documentation

## 2018-06-28 DIAGNOSIS — Z833 Family history of diabetes mellitus: Secondary | ICD-10-CM | POA: Insufficient documentation

## 2018-06-28 DIAGNOSIS — Z809 Family history of malignant neoplasm, unspecified: Secondary | ICD-10-CM | POA: Insufficient documentation

## 2018-06-28 DIAGNOSIS — Z3490 Encounter for supervision of normal pregnancy, unspecified, unspecified trimester: Secondary | ICD-10-CM

## 2018-06-28 DIAGNOSIS — Z3A01 Less than 8 weeks gestation of pregnancy: Secondary | ICD-10-CM | POA: Insufficient documentation

## 2018-06-28 DIAGNOSIS — O26891 Other specified pregnancy related conditions, first trimester: Secondary | ICD-10-CM

## 2018-06-28 LAB — CBC WITH DIFFERENTIAL/PLATELET
Abs Immature Granulocytes: 0.02 10*3/uL (ref 0.00–0.07)
BASOS ABS: 0.1 10*3/uL (ref 0.0–0.1)
BASOS PCT: 1 %
EOS ABS: 0 10*3/uL (ref 0.0–0.5)
EOS PCT: 0 %
HCT: 37.5 % (ref 36.0–46.0)
HEMOGLOBIN: 12.2 g/dL (ref 12.0–15.0)
Immature Granulocytes: 0 %
LYMPHS PCT: 36 %
Lymphs Abs: 1.9 10*3/uL (ref 0.7–4.0)
MCH: 30.4 pg (ref 26.0–34.0)
MCHC: 32.5 g/dL (ref 30.0–36.0)
MCV: 93.5 fL (ref 80.0–100.0)
Monocytes Absolute: 0.4 10*3/uL (ref 0.1–1.0)
Monocytes Relative: 7 %
NRBC: 0 % (ref 0.0–0.2)
Neutro Abs: 2.9 10*3/uL (ref 1.7–7.7)
Neutrophils Relative %: 56 %
PLATELETS: 258 10*3/uL (ref 150–400)
RBC: 4.01 MIL/uL (ref 3.87–5.11)
RDW: 12.5 % (ref 11.5–15.5)
WBC: 5.3 10*3/uL (ref 4.0–10.5)

## 2018-06-28 LAB — WET PREP, GENITAL
Clue Cells Wet Prep HPF POC: NONE SEEN
Trich, Wet Prep: NONE SEEN
WBC, Wet Prep HPF POC: NONE SEEN
YEAST WET PREP: NONE SEEN

## 2018-06-28 LAB — URINALYSIS, ROUTINE W REFLEX MICROSCOPIC
BILIRUBIN URINE: NEGATIVE
Glucose, UA: NEGATIVE mg/dL
Hgb urine dipstick: NEGATIVE
Ketones, ur: NEGATIVE mg/dL
Nitrite: NEGATIVE
PROTEIN: NEGATIVE mg/dL
SPECIFIC GRAVITY, URINE: 1.012 (ref 1.005–1.030)
pH: 6 (ref 5.0–8.0)

## 2018-06-28 LAB — HCG, QUANTITATIVE, PREGNANCY: HCG, BETA CHAIN, QUANT, S: 53528 m[IU]/mL — AB (ref <5)

## 2018-06-28 LAB — POCT PREGNANCY, URINE: PREG TEST UR: POSITIVE — AB

## 2018-06-28 NOTE — MAU Provider Note (Signed)
History     CSN: [id number]  Arrival date and time: 06/28/18 1129   First Provider Initiated Contact with Patient 06/28/18 1242      No chief complaint on file.  HPI Cassandra [name] is a 28 y.o. [id number] who presents to MAU with chief complaint of RLQ pain. This is a new problem onset one week ago. Patient states her complaint resolved yesterday without intervention. Patient rates previous pain as 9/10, waxes and wanes, does not radiate. She had not taken medication or tried other treatments for this pain. She denies vaginal bleeding, abnormal vaginal discharge, abdominal tenderness, fever or recent illness.  OB History    Gravida  3   Para  2   Term  2   Preterm  0   AB  0   Living  2     SAB  0   TAB  0   Ectopic  0   Multiple  0   Live Births  2           Past Medical History:  Diagnosis Date  . No pertinent past medical history     Past Surgical History:  Procedure Laterality Date  . wisdom teeth  2009    Family History  Problem Relation Age of Onset  . Asthma Mother   . Diabetes Mother   . Asthma Sister   . Asthma Brother   . Cancer Maternal Uncle   . Hypertension Paternal Grandfather     Social History   Tobacco Use  . Smoking status: Never Smoker  . Smokeless tobacco: Never Used  Substance Use Topics  . Alcohol use: No  . Drug use: No    Allergies: No Known Allergies  Medications Prior to Admission  Medication Sig Dispense Refill Last Dose  . acetaminophen (TYLENOL) 500 MG tablet Take 1 tablet (500 mg total) by mouth every 8 (eight) hours as needed for mild pain or moderate pain. 30 tablet 0   . chlorhexidine (PERIDEX) 0.12 % solution Use as directed 15 mLs in the mouth or throat 2 (two) times daily. 120 mL 0   . etonogestrel (IMPLANON) 68 MG IMPL implant Inject 1 each into the skin once.     Marland Kitchen ibuprofen (ADVIL,MOTRIN) 600 MG tablet Take 1 tablet (600 mg total) by mouth every 6 (six) hours as needed. 30 tablet 0   .  lidocaine (XYLOCAINE) 2 % solution Use as directed 10 mLs in the mouth or throat 2 (two) times daily as needed for mouth pain. 100 mL 0     Review of Systems  Constitutional: Negative for fever.  Respiratory: Negative for shortness of breath.   Gastrointestinal: Positive for abdominal pain. Negative for nausea and vomiting.  Genitourinary: Negative for difficulty urinating, vaginal bleeding, vaginal discharge and vaginal pain.  Musculoskeletal: Negative for back pain.  Neurological: Negative for headaches.  All other systems reviewed and are negative.  Physical Exam   Blood pressure 119/69, pulse 69, temperature 98.4 F (36.9 C), temperature source Oral, resp. rate 16, weight 66.4 kg, last menstrual period 05/09/2018, SpO2 100 %.  Physical Exam  Nursing note and vitals reviewed. Constitutional: She is oriented to person, place, and time. She appears well-developed and well-nourished.  Cardiovascular: Normal rate.  Respiratory: Effort normal.  GI: Soft. She exhibits no distension. There is no abdominal tenderness. There is no rebound, no guarding and no CVA tenderness.  Neurological: She is alert and oriented to person, place, and time.  Skin: Skin is warm  and dry.  Psychiatric: She has a normal mood and affect. Her behavior is normal. Judgment and thought content normal.    MAU Course/MDM  Procedures   Patient's complaint resolved prior to arrival in MAU  Patient Vitals for the past 24 hrs:  BP Temp Temp src Pulse Resp SpO2 Weight  06/28/18 1143 119/69 98.4 F (36.9 C) Oral 69 16 100 % 66.4 kg    Results for orders placed or performed during the hospital encounter of 06/28/18 (from the past 24 hour(s))  Pregnancy, urine POC     Status: Abnormal   Collection Time: 06/28/18 11:55 AM  Result Value Ref Range   Preg Test, Ur POSITIVE (A) NEGATIVE  CBC with Differential/Platelet     Status: None   Collection Time: 06/28/18 12:20 PM  Result Value Ref Range   WBC 5.3 4.0 - 10.5  K/uL   RBC 4.01 3.87 - 5.11 MIL/uL   Hemoglobin 12.2 12.0 - 15.0 g/dL   HCT 37.5 36.0 - 46.0 %   MCV 93.5 80.0 - 100.0 fL   MCH 30.4 26.0 - 34.0 pg   MCHC 32.5 30.0 - 36.0 g/dL   RDW 12.5 11.5 - 15.5 %   Platelets 258 150 - 400 K/uL   nRBC 0.0 0.0 - 0.2 %   Neutrophils Relative % 56 %   Neutro Abs 2.9 1.7 - 7.7 K/uL   Lymphocytes Relative 36 %   Lymphs Abs 1.9 0.7 - 4.0 K/uL   Monocytes Relative 7 %   Monocytes Absolute 0.4 0.1 - 1.0 K/uL   Eosinophils Relative 0 %   Eosinophils Absolute 0.0 0.0 - 0.5 K/uL   Basophils Relative 1 %   Basophils Absolute 0.1 0.0 - 0.1 K/uL   Immature Granulocytes 0 %   Abs Immature Granulocytes 0.02 0.00 - 0.07 K/uL  hCG, quantitative, pregnancy     Status: Abnormal   Collection Time: 06/28/18 12:20 PM  Result Value Ref Range   hCG, Beta Chain, Quant, S 53,528 (H) <5 mIU/mL  Urinalysis, Routine w reflex microscopic     Status: Abnormal   Collection Time: 06/28/18 12:21 PM  Result Value Ref Range   Color, Urine YELLOW YELLOW   APPearance CLEAR CLEAR   Specific Gravity, Urine 1.012 1.005 - 1.030   pH 6.0 5.0 - 8.0   Glucose, UA NEGATIVE NEGATIVE mg/dL   Hgb urine dipstick NEGATIVE NEGATIVE   Bilirubin Urine NEGATIVE NEGATIVE   Ketones, ur NEGATIVE NEGATIVE mg/dL   Protein, ur NEGATIVE NEGATIVE mg/dL   Nitrite NEGATIVE NEGATIVE   Leukocytes,Ua SMALL (A) NEGATIVE   RBC / HPF 0-5 0 - 5 RBC/hpf   WBC, UA 0-5 0 - 5 WBC/hpf   Bacteria, UA RARE (A) NONE SEEN   Squamous Epithelial / LPF 0-5 0 - 5   Mucus PRESENT    Sperm, UA PRESENT   Wet prep, genital     Status: None   Collection Time: 06/28/18  1:10 PM  Result Value Ref Range   Yeast Wet Prep HPF POC NONE SEEN NONE SEEN   Trich, Wet Prep NONE SEEN NONE SEEN   Clue Cells Wet Prep HPF POC NONE SEEN NONE SEEN   WBC, Wet Prep HPF POC NONE SEEN NONE SEEN   Sperm PRESENT     US Ob Less Than 14 Weeks With Ob Transvaginal  Result Date: 06/28/2018 CLINICAL DATA:  Right lower quadrant abdominal  pain, [redacted] weeks gestation. EXAM: OBSTETRIC <14 WK [place] AND TRANSVAGINAL OB US TECHNIQUE:  Both transabdominal and transvaginal ultrasound examinations were performed for complete evaluation of the gestation as well as the maternal uterus, adnexal regions, and pelvic cul-de-sac. Transvaginal technique was performed to assess early pregnancy. COMPARISON:  None. FINDINGS: Intrauterine gestational sac: Single Yolk sac:  Yes Embryo:  Yes Cardiac Activity: Yes Heart Rate: 120 bpm CRL:  6.4 mm   6 w   3 d                  [place] EDC: 02/18/19 Subchorionic hemorrhage:  None visualized. Maternal uterus/adnexae: Subchorionic hemorrhage: None Right ovary: Not visualized Left ovary: Appears normal. Other :Within the right adnexa there is a large, hypovascular solid-appearing mass which measures 7.1 x 6.5 x 6.1 cm. Free fluid:  None IMPRESSION: 1. Single living intrauterine gestation is identified with an estimated gestational age of [redacted] weeks and 3 days. 2. Large, solid-appearing hypovascular mass within the right adnexa is identified measuring up to 7.1 cm. The primary differential considerations include large pedunculated fibroid versus ovarian neoplasm (benign or malignant). Consider further evaluation with noncontrast enhanced MRI of the pelvis. Gynecologic consultation and close ultrasound follow-up is recommended. Electronically Signed   By: Kerby Moors M.D.   On: 06/28/2018 14:41    Assessment and Plan  --28 y.o. G3P2002 at with [place] at 6w 3d by [place] --Reviewed first trimester precautions, safe medications in pregnancy --Pt declines rx for prenatal vitamins --Discharge home in stable condition  F/U: Patient to establish prenatal care around [redacted] weeks gestation         Patient to coordinate outpatient follow-up for right adnexal mass  Darlina Rumpf, CNM 06/28/2018, 2:55 PM

## 2018-06-28 NOTE — Discharge Instructions (Signed)

## 2018-06-28 NOTE — MAU Note (Signed)
+  HPT abour 2 wks ago. Been having real bad stomach pain, RLQ, started about a wk ago.  Was a little concerned, currently not having any pain, none today.

## 2018-07-25 NOTE — Progress Notes (Signed)
Subjective:   Patient ID: Cassandra [name]    DOB: 1990/04/24, 28 y.o. female   MRN: [id number]  ASHANA TULLO is a 28 y.o. yo G3P2002 at [redacted]w[redacted]d who presents for her initial prenatal visit.  Pregnancy is not planned however patient does want the baby She reports morning sickness and nausea. She  is not currently taking PNV due to morning sickness. See flow sheet for details.  PMH, POBH, FH, meds, allergies and Social Hx reviewed.  Patient has PMH of GC/Cl in last pregnancy and was GBS +. No other pertinent medical history noted in prior two pregnancies.   Prepregnancy weight: 140. Wt today: 143. BMI: 24. Healthy weight gain goal: 25-35 lbs during pregnancy.  Objective:   BP 92/62   Pulse 64   Temp 98.5 F (36.9 C)   Wt 143 lb (64.9 kg)   LMP 05/09/2018   BMI 24.55 kg/m  Vitals and nursing note reviewed.  Prenatal Exam: Gen: Well nourished, well developed.  No distress.  Vitals noted. HEENT: Normocephalic, atraumatic.  Neck supple.  Fair dentition. CV: RRR no murmur, gallops or rubs Lungs: CTAB.  Normal respiratory effort without wheezes or rales. Abd: soft, NTND. +BS.  Uterus not appreciated above pelvis. GU: Normal external female genitalia without lesions.  Normal vaginal, well rugated without lesions. Clear/white mucous vaginal discharge, no odor.  Bimanual exam: possible right adnexal mass appreciated and tender to palpation. Friable cervix but no CMT Ext: No clubbing, cyanosis or edema. Psych: Normal grooming and dress.  Not depressed or anxious appearing.  Normal thought content and process without flight of ideas or looseness of associations.  Assessment & Plan:   28 y.o. yo G3P2002 at [redacted]w[redacted]d via LMP doing well.   Adnexal mass Right hypovascular solid appearing adnexal mass measuring 7x6.5x6cm noted on dating ultrasound.  Appreciated on bimanual exam although difficult with some tenderness associated.  Will refer to OB/GYN for further evaluation Return  precautions discussed at length  Morning sickness Recommended OTC Unisom and Vit. B6  Provided instruction for use  Encounter for supervision of other normal pregnancy, first trimester Patient doing well.  Current pregnancy issues include morning sickness and ultrasound positive for large right adnexal mass (hypovascular, solid appearing) measuring 7x6.5x6cm. Dating is reliable. Prenatal labs ordered and Pap test/GC/Cl completed.  Genetic screening offered: Patient interested in the First trimester screen. Nuchal Translucency ordered. Early glucola is indicated. Will plan to have 1-hour GTT completed at next OB visit. PHQ-9 and Pregnancy Medical Home forms completed and reviewed. Appropriate weight gain of 25-35lb during pregnancy with normal BMI discussed.   Bleeding, pain, and preterm labor precautions reviewed. Importance of prenatal vitamins reviewed.  Follow up in 4 weeks.  Orders Placed This Encounter  Procedures  . Culture, OB Urine  . US OB Comp Less 14 Wks    This external order was created through the Ultrasound Visit Navigator section.  Marland Kitchen US Fetal Nuchal Translucency Measurement    Standing Status:   Future    Standing Expiration Date:   09/25/2019    Order Specific Question:   Reason for Exam (SYMPTOM  OR DIAGNOSIS REQUIRED)    Answer:   First Trimester Genetic Screen    Order Specific Question:   Preferred Imaging Location?    Answer:   [facility]  . Obstetric Panel, Including HIV([facility])  . HGB FRAC. W/SOLUBILITY  . Ambulatory referral to Obstetrics / Gynecology    Referral Priority:   Routine    Referral Type:  Consultation    Referral Reason:   Specialty Services Required    Requested Specialty:   Obstetrics and Gynecology    Number of Visits Requested:   [place], DO PGY-1, [facility] 07/27/2018 3:11 PM

## 2018-07-26 ENCOUNTER — Other Ambulatory Visit (HOSPITAL_COMMUNITY)
Admission: RE | Admit: 2018-07-26 | Discharge: 2018-07-26 | Disposition: A | Discharge status: Home / Self Care | Payer: Medicaid Other | Source: Ambulatory Visit | Attending: Family Medicine | Admitting: Family Medicine

## 2018-07-26 ENCOUNTER — Ambulatory Visit (INDEPENDENT_AMBULATORY_CARE_PROVIDER_SITE_OTHER): Payer: Medicaid Other | Admitting: Family Medicine

## 2018-07-26 ENCOUNTER — Other Ambulatory Visit: Payer: Self-pay

## 2018-07-26 VITALS — BP 92/62 | HR 64 | Temp 98.5°F | Wt 143.0 lb

## 2018-07-26 DIAGNOSIS — Z3481 Encounter for supervision of other normal pregnancy, first trimester: Secondary | ICD-10-CM | POA: Insufficient documentation

## 2018-07-26 DIAGNOSIS — Z3483 Encounter for supervision of other normal pregnancy, third trimester: Secondary | ICD-10-CM | POA: Insufficient documentation

## 2018-07-26 DIAGNOSIS — N9489 Other specified conditions associated with female genital organs and menstrual cycle: Secondary | ICD-10-CM

## 2018-07-26 DIAGNOSIS — O210 Mild hyperemesis gravidarum: Secondary | ICD-10-CM

## 2018-07-26 NOTE — Assessment & Plan Note (Signed)
Recommended OTC Unisom and Vit. B6  Provided instruction for use

## 2018-07-26 NOTE — Patient Instructions (Addendum)
Safe Medications in Pregnancy   Acne: Benzoyl Peroxide Salicylic Acid  Backache/Headache: Tylenol: 2 regular strength every 4 hours OR              2 Extra strength every 6 hours  Colds/Coughs/Allergies: Benadryl (alcohol free) 25 mg every 6 hours as needed Breath right strips Claritin Cepacol throat lozenges Chloraseptic throat spray Cold-Eeze- up to three times per day Cough drops, alcohol free Flonase (by prescription only) Guaifenesin Mucinex Robitussin DM (plain only, alcohol free) Saline nasal spray/drops Sudafed (pseudoephedrine) & Actifed ** use only after [redacted] weeks gestation and if you do not have high blood pressure Tylenol Vicks Vaporub Zinc lozenges Zyrtec   Constipation: Colace Ducolax suppositories Fleet enema Glycerin suppositories Metamucil Milk of magnesia Miralax Senokot Smooth move tea  Diarrhea: Kaopectate Imodium A-D  *NO pepto Bismol  Hemorrhoids: Anusol Anusol HC Preparation H Tucks  Indigestion: Tums Maalox Mylanta Zantac  Pepcid  Insomnia: Benadryl (alcohol free) 25mg  every 6 hours as needed Tylenol PM Unisom, no Gelcaps  Leg Cramps: Tums MagGel  Nausea/Vomiting:  Bonine Dramamine Emetrol Ginger extract Sea bands Meclizine  Nausea medication to take during pregnancy:  Unisom (doxylamine succinate 25 mg tablets) Take one tablet daily at bedtime. If symptoms are not adequately controlled, the dose can be increased to a maximum recommended dose of two tablets daily (1/2 tablet in the morning, 1/2 tablet mid-afternoon and one at bedtime). Vitamin B6 100mg  tablets. Take one tablet twice a day (up to 200 mg per day).  Skin Rashes: Aveeno products Benadryl cream or 25mg  every 6 hours as needed Calamine Lotion 1% cortisone cream  Yeast infection: Gyne-lotrimin 7 Monistat 7   **If taking multiple medications, please check labels to avoid duplicating the same active ingredients **take medication as directed on  the label ** Do not exceed 4000 mg of tylenol in 24 hours **Do not take medications that contain aspirin or ibuprofen      First Trimester of Pregnancy  The first trimester of pregnancy is from week 1 until the end of week 13 (months 1 through 3). During this time, your baby will begin to develop inside you. At 6-8 weeks, the eyes and face are formed, and the heartbeat can be seen on ultrasound. At the end of 12 weeks, all the baby's organs are formed. Prenatal care is all the medical care you receive before the birth of your baby. Make sure you get good prenatal care and follow all of your doctor's instructions. Follow these instructions at home: Medicines  Take over-the-counter and prescription medicines only as told by your doctor. Some medicines are safe and some medicines are not safe during pregnancy.  Take a prenatal vitamin that contains at least 600 micrograms (mcg) of folic acid.  If you have trouble pooping (constipation), take medicine that will make your stool soft (stool softener) if your doctor approves. Eating and drinking   Eat regular, healthy meals.  Your doctor will tell you the amount of weight gain that is right for you.  Avoid raw meat and uncooked cheese.  If you feel sick to your stomach (nauseous) or throw up (vomit): ? Eat 4 or 5 small meals a day instead of 3 large meals. ? Try eating a few soda crackers. ? Drink liquids between meals instead of during meals.  To prevent constipation: ? Eat foods that are high in fiber, like fresh fruits and vegetables, whole grains, and beans. ? Drink enough fluids to keep your pee (urine) clear or pale  yellow. Activity  Exercise only as told by your doctor. Stop exercising if you have cramps or pain in your lower belly (abdomen) or low back.  Do not exercise if it is too hot, too humid, or if you are in a place of great height (high altitude).  Try to avoid standing for long periods of time. Move your legs often  if you must stand in one place for a long time.  Avoid heavy lifting.  Wear low-heeled shoes. Sit and stand up straight.  You can have sex unless your doctor tells you not to. Relieving pain and discomfort  Wear a good support bra if your breasts are sore.  Take warm water baths (sitz baths) to soothe pain or discomfort caused by hemorrhoids. Use hemorrhoid cream if your doctor says it is okay.  Rest with your legs raised if you have leg cramps or low back pain.  If you have puffy, bulging veins (varicose veins) in your legs: ? Wear support hose or compression stockings as told by your doctor. ? Raise (elevate) your feet for 15 minutes, 3-4 times a day. ? Limit salt in your food. Prenatal care  Schedule your prenatal visits by the twelfth week of pregnancy.  Write down your questions. Take them to your prenatal visits.  Keep all your prenatal visits as told by your doctor. This is important. Safety  Wear your seat belt at all times when driving.  Make a list of emergency phone numbers. The list should include numbers for family, friends, the hospital, and police and fire departments. General instructions  Ask your doctor for a referral to a local prenatal class. Begin classes no later than at the start of month 6 of your pregnancy.  Ask for help if you need counseling or if you need help with nutrition. Your doctor can give you advice or tell you where to go for help.  Do not use hot tubs, steam rooms, or saunas.  Do not douche or use tampons or scented sanitary pads.  Do not cross your legs for long periods of time.  Avoid all herbs and alcohol. Avoid drugs that are not approved by your doctor.  Do not use any tobacco products, including cigarettes, chewing tobacco, and electronic cigarettes. If you need help quitting, ask your doctor. You may get counseling or other support to help you quit.  Avoid cat litter boxes and soil used by cats. These carry germs that can  cause birth defects in the baby and can cause a loss of your baby (miscarriage) or stillbirth.  Visit your dentist. At home, brush your teeth with a soft toothbrush. Be gentle when you floss. Contact a doctor if:  You are dizzy.  You have mild cramps or pressure in your lower belly.  You have a nagging pain in your belly area.  You continue to feel sick to your stomach, you throw up, or you have watery poop (diarrhea).  You have a bad smelling fluid coming from your vagina.  You have pain when you pee (urinate).  You have increased puffiness (swelling) in your face, hands, legs, or ankles. Get help right away if:  You have a fever.  You are leaking fluid from your vagina.  You have spotting or bleeding from your vagina.  You have very bad belly cramping or pain.  You gain or lose weight rapidly.  You throw up blood. It may look like coffee grounds.  You are around people who have [place] measles, fifth  disease, or chickenpox.  You have a very bad headache.  You have shortness of breath.  You have any kind of trauma, such as from a fall or a car accident. Summary  The first trimester of pregnancy is from week 1 until the end of week 13 (months 1 through 3).  To take care of yourself and your unborn baby, you will need to eat healthy meals, take medicines only if your doctor tells you to do so, and do activities that are safe for you and your baby.  Keep all follow-up visits as told by your doctor. This is important as your doctor will have to ensure that your baby is healthy and growing well. This information is not intended to replace advice given to you by your health care provider. Make sure you discuss any questions you have with your health care provider. Document Released: 09/23/2007 Document Revised: 04/14/2016 Document Reviewed: 04/14/2016 Elsevier Interactive Patient Education  2019 [employer].

## 2018-07-26 NOTE — Assessment & Plan Note (Addendum)
Patient doing well.  Current pregnancy issues include morning sickness and ultrasound positive for large right adnexal mass (hypovascular, solid appearing) measuring 7x6.5x6cm. Dating is reliable. Prenatal labs ordered and Pap test/GC/Cl completed.  Genetic screening offered: Patient interested in the First trimester screen. Nuchal Translucency ordered. Early glucola is indicated. Will plan to have 1-hour GTT completed at next OB visit. PHQ-9 and Pregnancy Medical Home forms completed and reviewed. Appropriate weight gain of 25-35lb during pregnancy with normal BMI discussed.   Bleeding, pain, and preterm labor precautions reviewed. Importance of prenatal vitamins reviewed.  Follow up in 4 weeks.

## 2018-07-26 NOTE — Assessment & Plan Note (Addendum)
Right hypovascular solid appearing adnexal mass measuring 7x6.5x6cm noted on dating ultrasound.  Appreciated on bimanual exam although difficult with some tenderness associated.  Will refer to OB/GYN for further evaluation Return precautions discussed at length

## 2018-07-27 LAB — CYTOLOGY - PAP: Diagnosis: NEGATIVE

## 2018-07-27 LAB — CERVICOVAGINAL ANCILLARY ONLY
Chlamydia: NEGATIVE
Neisseria Gonorrhea: NEGATIVE

## 2018-07-28 ENCOUNTER — Telehealth: Payer: Self-pay | Admitting: Family Medicine

## 2018-07-28 LAB — OBSTETRIC PANEL, INCLUDING HIV
Antibody Screen: NEGATIVE
Basophils Absolute: 0.1 10*3/uL (ref 0.0–0.2)
Basos: 1 %
EOS (ABSOLUTE): 0 10*3/uL (ref 0.0–0.4)
Eos: 0 %
HIV Screen 4th Generation wRfx: NONREACTIVE
Hematocrit: 39.5 % (ref 34.0–46.6)
Hemoglobin: 12.9 g/dL (ref 11.1–15.9)
Hepatitis B Surface Ag: NEGATIVE
Immature Grans (Abs): 0 10*3/uL (ref 0.0–0.1)
Immature Granulocytes: 0 %
Lymphocytes Absolute: 1.7 10*3/uL (ref 0.7–3.1)
Lymphs: 29 %
MCH: 31.3 pg (ref 26.6–33.0)
MCHC: 32.7 g/dL (ref 31.5–35.7)
MCV: 96 fL (ref 79–97)
Monocytes Absolute: 0.2 10*3/uL (ref 0.1–0.9)
Monocytes: 4 %
Neutrophils Absolute: 3.8 10*3/uL (ref 1.4–7.0)
Neutrophils: 66 %
Platelets: 289 10*3/uL (ref 150–450)
RBC: 4.12 x10E6/uL (ref 3.77–5.28)
RDW: 13.4 % (ref 11.7–15.4)
RPR Ser Ql: NONREACTIVE
Rh Factor: POSITIVE
Rubella Antibodies, IGG: 5.37 index (ref >0.99)
WBC: 5.9 10*3/uL (ref 3.4–10.8)

## 2018-07-28 LAB — HGB FRAC. W/SOLUBILITY
Hgb A2 Quant: 2.3 % (ref 1.8–3.2)
Hgb A: 97.7 % (ref 96.4–98.8)
Hgb C: 0 %
Hgb F Quant: 0 % (ref 0.0–2.0)
Hgb S: 0 %
Hgb Solubility: NEGATIVE
Hgb Variant: 0 %

## 2018-07-28 NOTE — Progress Notes (Signed)
See telephone encounter concerning pap results and treatment recommendations

## 2018-07-28 NOTE — Telephone Encounter (Signed)
Spoke to patient and informed her of lab results including papsmear and incidental finding of yeast.  Recommended OTC Monistat: 1 application QD x 7 days. Patient noted she works at [employer] and will be able to pick it up today. Patient asked about GYN referral for adnexal mass. Recommended she wait to receive a call to get scheduled for another 1-2 weeks and if nothing she can call the clinic to check in. She understood and agreed to plan.

## 2018-07-29 LAB — URINE CULTURE, OB REFLEX

## 2018-07-29 LAB — CULTURE, OB URINE

## 2018-08-07 ENCOUNTER — Other Ambulatory Visit: Payer: Self-pay

## 2018-08-07 ENCOUNTER — Emergency Department (HOSPITAL_COMMUNITY)
Admission: EM | Admit: 2018-08-07 | Discharge: 2018-08-07 | Disposition: A | Discharge status: Home / Self Care | Payer: Medicaid Other | Attending: Emergency Medicine | Admitting: Emergency Medicine

## 2018-08-07 ENCOUNTER — Encounter (HOSPITAL_COMMUNITY): Payer: Self-pay | Admitting: *Deleted

## 2018-08-07 DIAGNOSIS — O99511 Diseases of the respiratory system complicating pregnancy, first trimester: Secondary | ICD-10-CM | POA: Diagnosis not present

## 2018-08-07 DIAGNOSIS — R05 Cough: Secondary | ICD-10-CM | POA: Diagnosis not present

## 2018-08-07 DIAGNOSIS — J029 Acute pharyngitis, unspecified: Secondary | ICD-10-CM | POA: Diagnosis not present

## 2018-08-07 DIAGNOSIS — R059 Cough, unspecified: Secondary | ICD-10-CM

## 2018-08-07 DIAGNOSIS — Z3A12 12 weeks gestation of pregnancy: Secondary | ICD-10-CM | POA: Insufficient documentation

## 2018-08-07 DIAGNOSIS — O9989 Other specified diseases and conditions complicating pregnancy, childbirth and the puerperium: Secondary | ICD-10-CM | POA: Diagnosis not present

## 2018-08-07 DIAGNOSIS — J309 Allergic rhinitis, unspecified: Secondary | ICD-10-CM | POA: Insufficient documentation

## 2018-08-07 DIAGNOSIS — H1012 Acute atopic conjunctivitis, left eye: Secondary | ICD-10-CM | POA: Diagnosis not present

## 2018-08-07 DIAGNOSIS — R0981 Nasal congestion: Secondary | ICD-10-CM | POA: Insufficient documentation

## 2018-08-07 NOTE — ED Triage Notes (Signed)
Pt states she has a productive cough started 9 days ago followed by sore throat 7 days ago. Due to pregnancy she is limited to what she can take. Pt denies fever, highest temp 99.0 reported.

## 2018-08-07 NOTE — Discharge Instructions (Signed)
Continue to stay well-hydrated. Gargle warm salt water and spit it out and use chloraseptic spray as needed for sore throat. Continue to use Tylenol as needed for pain or fever. Do not use ibuprofen. Use Mucinex for cough suppression/expectoration of mucus. Use over the counter flonase and the netipot to help with nasal congestion. May consider over-the-counter Benadryl or other antihistamine like Claritin/Zyrtec/etc to decrease secretions and for help with your symptoms. May use over the counter visine to help with eye redness. Follow up with your primary care doctor in 5-7 days for recheck of ongoing symptoms. Return to emergency department for emergent changing or worsening of symptoms.

## 2018-08-07 NOTE — ED Notes (Signed)
ED Provider at bedside. 

## 2018-08-07 NOTE — ED Provider Notes (Addendum)
[facility] DEPT Provider Note   CSN: [id number] Arrival date & time: 08/07/18  1113    History   Chief Complaint Chief Complaint  Patient presents with   Cough   Sore Throat    HPI    Cassandra [name] is a 28 y.o. otherwise healthy female (G3P2002 currently [redacted]w[redacted]d pregnant), who presents to the ED with complaints of cough that began 9 days ago and sore throat that began 7 days ago.  Patient reports that she has had a mild cough with slight yellow sputum production for the last 9 days, followed by a sore throat that began a week ago.  She is tried warm tea and cough drops without relief.  She states her symptoms seem to be worse at night but there are no specific known aggravating factors.  She denies any known sick contacts or COVID 19 exposures however she works at [employer].  She has not traveled.  She is non-smoker.  She reports that yesterday she had some minimal left eye redness and clear drainage but does not feel like she has pinkeye.  She is also noticed some mild rhinorrhea.  She states that her boyfriend was concerned because she is pregnant, so she was urged to be seen for her symptoms today.  She denies any eye pain, crusting of the eyelids, vision changes, eye itching, drooling, trismus, ear pain or drainage, fevers, chills, CP, SOB, abd pain, N/V/D/C, hematuria, dysuria, vaginal bleeding or discharge, myalgias, arthralgias, numbness, tingling, focal weakness, or any other complaints at this time.   The history is provided by the patient and medical records. No language interpreter was used.    Past Medical History:  Diagnosis Date   No pertinent past medical history     Patient Active Problem List   Diagnosis Date Noted   Encounter for supervision of other normal pregnancy, first trimester 07/26/2018   Adnexal mass 07/26/2018   Morning sickness 07/26/2018   Healthcare maintenance 04/15/2016    Past Surgical History:  Procedure  Laterality Date   wisdom teeth  2009     OB History    Gravida  3   Para  2   Term  2   Preterm  0   AB  0   Living  2     SAB  0   TAB  0   Ectopic  0   Multiple  0   Live Births  2        Obstetric Comments  Induced for postdates.          Home Medications    Prior to Admission medications   Medication Sig Start Date End Date Taking? Authorizing Provider  acetaminophen (TYLENOL) 500 MG tablet Take 1 tablet (500 mg total) by mouth every 8 (eight) hours as needed for mild pain or moderate pain. 04/05/17   Frederica Kuster, PA-C    Family History Family History  Problem Relation Age of Onset   Asthma Mother    Diabetes Mother    Asthma Sister    Asthma Brother    Cancer Maternal Uncle    Hypertension Paternal Grandfather     Social History Social History   Tobacco Use   Smoking status: Never Smoker   Smokeless tobacco: Never Used  Substance Use Topics   Alcohol use: No   Drug use: No     Allergies   Patient has no known allergies.   Review of Systems Review of Systems  Constitutional: Negative for chills and fever.  HENT: Positive for rhinorrhea and sore throat. Negative for drooling, ear discharge, ear pain and trouble swallowing.   Eyes: Positive for discharge (clear) and redness. Negative for pain, itching and visual disturbance.  Respiratory: Positive for cough. Negative for shortness of breath.   Cardiovascular: Negative for chest pain.  Gastrointestinal: Negative for abdominal pain, constipation, diarrhea, nausea and vomiting.  Genitourinary: Negative for dysuria, hematuria, vaginal bleeding and vaginal discharge.  Musculoskeletal: Negative for arthralgias and myalgias.  Skin: Negative for color change.  Allergic/Immunologic: Negative for immunocompromised state.  Neurological: Negative for weakness and numbness.  Psychiatric/Behavioral: Negative for confusion.   All other systems reviewed and are negative for acute  change except as noted in the HPI.    Physical Exam Updated Vital Signs BP 125/83 (BP Location: Left Arm)    Pulse 83    Temp 98.9 F (37.2 C) (Oral)    Resp 18    LMP 05/09/2018    SpO2 100%   Physical Exam Vitals signs and nursing note reviewed.  Constitutional:      General: She is not in acute distress.    Appearance: Normal appearance. She is well-developed. She is not toxic-appearing.     Comments: Afebrile, nontoxic, NAD  HENT:     Head: Normocephalic and atraumatic.     Nose: Congestion and rhinorrhea present.     Comments: Mild clear rhinorrhea and nasal congestion    Mouth/Throat:     Mouth: Mucous membranes are moist.     Pharynx: Oropharynx is clear. Uvula midline. No pharyngeal swelling, oropharyngeal exudate, posterior oropharyngeal erythema or uvula swelling.     Tonsils: No tonsillar exudate or tonsillar abscesses.     Comments: Oropharynx clear and moist, without uvular swelling or deviation, no trismus or drooling, no tonsillar swelling or erythema, no exudates.   Eyes:     General: Lids are normal.        Right eye: No discharge.        Left eye: No foreign body or discharge.     Extraocular Movements: Extraocular movements intact.     Conjunctiva/sclera: Conjunctivae normal.     Pupils: Pupils are equal, round, and reactive to light.     Comments: EOMI, PERRL. R eye clear. L eye conjunctiva barely pink laterally but not globally injected, no drainage or crusting of the eyelids.   Neck:     Musculoskeletal: Normal range of motion and neck supple.  Cardiovascular:     Rate and Rhythm: Normal rate and regular rhythm.     Pulses: Normal pulses.     Heart sounds: Normal heart sounds, S1 normal and S2 normal. No murmur. No friction rub. No gallop.   Pulmonary:     Effort: Pulmonary effort is normal. No respiratory distress.     Breath sounds: Normal breath sounds. No decreased breath sounds, wheezing, rhonchi or rales.     Comments: CTAB in all lung fields, no  w/r/r, no hypoxia or increased WOB, speaking in full sentences, SpO2 100% on RA  Abdominal:     General: Bowel sounds are normal. There is no distension.     Palpations: Abdomen is soft. Abdomen is not rigid.     Tenderness: There is no abdominal tenderness. There is no right CVA tenderness, left CVA tenderness, guarding or rebound. Negative signs include Murphy's sign and McBurney's sign.  Musculoskeletal: Normal range of motion.  Skin:    General: Skin is warm and dry.  Findings: No rash.  Neurological:     Mental Status: She is alert and oriented to person, place, and time.     Sensory: Sensation is intact. No sensory deficit.     Motor: Motor function is intact.  Psychiatric:        Mood and Affect: Mood and affect normal.        Behavior: Behavior normal.      ED Treatments / Results  Labs (all labs ordered are listed, but only abnormal results are displayed) Labs Reviewed - No data to display  EKG None  Radiology No results found.  Procedures Procedures (including critical care time)  Medications Ordered in ED Medications - No data to display   Initial Impression / Assessment and Plan / ED Course  I have reviewed the triage vital signs and the nursing notes.  Pertinent labs & imaging results that were available during my care of the patient were reviewed by me and considered in my medical decision making (see chart for details).        28 y.o. female here with cough x9 days, sore throat x7 days, and 1 day of L eye redness and clear drainage. On exam, L conjunctiva barely pink laterally but not globally injected, no drainage or crusting; nose mildly congested with rhinorrhea; throat clear; lungs clear. Afebrile and nontoxic. No abdominal tenderness. Suspect allergic vs viral etiology, doubt bacterial infection of eye or upper respiratory tract; doubt corneal ulcer/abrasion, doubt need for fluorescein staining at this time. Doubt COVID 19, and pt already past  time frame that she would need to self-quarantine anyway. Doubt need for testing. Doubt need for abx. Doubt need for CXR given clear lung exam, offered this as an option to the pt but she declined stating she doesn't feel she needs one. Advised OTC remedies for symptom relief that are safe in pregnancy. F/up with PCP in 1wk. I explained the diagnosis and have given explicit precautions to return to the ER including for any other new or worsening symptoms. The patient understands and accepts the medical plan as it's been dictated and I have answered their questions. Discharge instructions concerning home care and prescriptions have been given. The patient is STABLE and is discharged to home in good condition.   Cassandra [name] was evaluated in Emergency Department on 08/07/2018 for the symptoms described in the history of present illness. She was evaluated in the context of the global COVID-19 pandemic, which necessitated consideration that the patient might be at risk for infection with the SARS-CoV-2 virus that causes COVID-19. Institutional protocols and algorithms that pertain to the evaluation of patients at risk for COVID-19 are in a state of rapid change based on information released by regulatory bodies including the CDC and federal and state organizations. These policies and algorithms were followed during the patient's care in the ED.   Final Clinical Impressions(s) / ED Diagnoses   Final diagnoses:  Cough  Sore throat  Allergic conjunctivitis of left eye and rhinitis    ED Discharge Orders    [address] [place], [place] 08/07/18 1151    Davonna Belling, MD 08/07/18 1526

## 2018-08-15 ENCOUNTER — Telehealth: Payer: Self-pay | Admitting: Advanced Practice Midwife

## 2018-08-15 NOTE — Telephone Encounter (Signed)
The patient called in stated she was referred to our clinic for fibroids. Informed of the wait list due to the [place]. The patient verbalized understanding.

## 2018-08-22 NOTE — Progress Notes (Signed)
Cassandra [name] is a 28 y.o. G3P2002 at [redacted]w[redacted]d here for routine follow up.  She reports no bleeding, no contractions and no leaking. See flow sheet for details.  Still having some pain in RLQ. No bleeding or intense pain, no contractions. No FH of malignancy. Called [facility] about appointment referral, placed on waiting list. Took OTC meds for yeast infection, no issues since.  A/P: Pregnancy at [redacted]w[redacted]d.  Doing well.   Pregnancy issues include:  1st trimester labs reviewed. Taking PNV. - Anatomy ultrasound ordered to be scheduled at 18-19 weeks. - Patient is not interested in genetic screening. - Early glucola is NOT indicated. Largest baby <4kg, no FH or PMH of DM. Is African American but no other risk factors. - Has h/o varicella, no need for titers. - R adnexal mass - visualized on dating [place] and prior pelvic exam. Spoke to [facility] attending, given that mass is hypovascular is reassuring but recommended obtaining MRI w/o contrast to further characterize, ok to remain on waiting list until then. Order placed.  Bleeding and pain precautions reviewed. Follow up 4 weeks.

## 2018-08-23 ENCOUNTER — Other Ambulatory Visit: Payer: Self-pay

## 2018-08-23 ENCOUNTER — Encounter: Payer: Self-pay | Admitting: Family Medicine

## 2018-08-23 ENCOUNTER — Ambulatory Visit (INDEPENDENT_AMBULATORY_CARE_PROVIDER_SITE_OTHER): Payer: Medicaid Other | Admitting: Family Medicine

## 2018-08-23 VITALS — BP 98/55 | HR 68 | Temp 98.4°F | Wt 145.0 lb

## 2018-08-23 DIAGNOSIS — N949 Unspecified condition associated with female genital organs and menstrual cycle: Secondary | ICD-10-CM

## 2018-08-23 DIAGNOSIS — Z3481 Encounter for supervision of other normal pregnancy, first trimester: Secondary | ICD-10-CM

## 2018-08-23 NOTE — Patient Instructions (Signed)
It was great to see you!  Our plans for today:  - We made appointments for your anatomy ultrasound. - We ordered an MRI to better assess the mass found on your early ultrasound.  - continue to take your prenatal vitamins - follow up in 4 weeks for your next prenatal visit - If you have bleeding or intense pelvic pain, go to the MAU at [facility].  Take care and seek immediate care sooner if you develop any concerns.   Dr. [facility]

## 2018-08-25 ENCOUNTER — Telehealth: Payer: Self-pay

## 2018-08-25 NOTE — Telephone Encounter (Signed)
Cassandra [name], with [facility], called nurse line stating they need a radiologist approval before they can schedule MRI, due to patient being pregnant. You can call radiology [phone] for approval, and please document which radiologist you spoke with.

## 2018-08-29 NOTE — Telephone Encounter (Signed)
Spoke with Dr. Kris Hartmann who approved MRI pelvis w/o contrast for better characterization of pelvic mass. Ok to schedule.

## 2018-08-30 NOTE — Telephone Encounter (Signed)
Informed [place] imaging. They will call the patient to schedule.

## 2018-09-02 ENCOUNTER — Ambulatory Visit
Admission: RE | Admit: 2018-09-02 | Discharge: 2018-09-02 | Disposition: A | Discharge status: Home / Self Care | Payer: Medicaid Other | Source: Ambulatory Visit | Attending: Family Medicine | Admitting: Family Medicine

## 2018-09-02 ENCOUNTER — Other Ambulatory Visit: Payer: Self-pay

## 2018-09-02 DIAGNOSIS — N949 Unspecified condition associated with female genital organs and menstrual cycle: Secondary | ICD-10-CM

## 2018-09-08 ENCOUNTER — Telehealth: Payer: Self-pay

## 2018-09-08 NOTE — Telephone Encounter (Signed)
Malachy Mood, with [facility], called stating the patients upcoming ultrasound order needs to be changed to [place] MFM Complete +. Malachy Mood stated the patient is too far along and this will need to be the new order, and they will call to schedule her with [facility]. Please advise.

## 2018-09-09 ENCOUNTER — Other Ambulatory Visit: Payer: Self-pay | Admitting: Family Medicine

## 2018-09-09 NOTE — Telephone Encounter (Signed)
It looks like it's already ordered and scheduled for 6/2, is it supposed to be different? When I put in "[place] MFM COMP +" it comes up as the one already ordered and scheduled. Can we clarify with Malachy Mood?

## 2018-09-14 ENCOUNTER — Ambulatory Visit (HOSPITAL_COMMUNITY): Payer: Medicaid Other

## 2018-09-15 NOTE — Telephone Encounter (Signed)
I see that. She said she would call the patient to schedule once the order was in.

## 2018-09-19 ENCOUNTER — Ambulatory Visit (INDEPENDENT_AMBULATORY_CARE_PROVIDER_SITE_OTHER): Payer: Medicaid Other | Admitting: Family Medicine

## 2018-09-19 ENCOUNTER — Encounter: Payer: Self-pay | Admitting: Family Medicine

## 2018-09-19 ENCOUNTER — Other Ambulatory Visit: Payer: Self-pay

## 2018-09-19 VITALS — BP 98/58 | HR 82 | Wt 155.8 lb

## 2018-09-19 DIAGNOSIS — Z3492 Encounter for supervision of normal pregnancy, unspecified, second trimester: Secondary | ICD-10-CM

## 2018-09-19 NOTE — Patient Instructions (Addendum)
It was great to see you!  Our plans for today:  - We will review your anatomy scan with you at your next appointment. We will call if anything is abnormal. - We will do your blood glucose test at 24-26 weeks. - Call the [facility] at [phone] to check on the status of your waitlisted appointment. - If you have contractions, bleeding, or leakage of fluid, go to the MAU at [facility]. - Follow up in 4 weeks.  Take care and seek immediate care sooner if you develop any concerns.   Dr. [facility]  Second Trimester of Pregnancy  The second trimester is from week 14 through week 27 (month 4 through 6). This is often the time in pregnancy that you feel your best. Often times, morning sickness has lessened or quit. You may have more energy, and you may get hungry more often. Your unborn baby is growing rapidly. At the end of the sixth month, he or she is about 9 inches long and weighs about 1 pounds. You will likely feel the baby move between 18 and 20 weeks of pregnancy. Follow these instructions at home: Medicines  Take over-the-counter and prescription medicines only as told by your doctor. Some medicines are safe and some medicines are not safe during pregnancy.  Take a prenatal vitamin that contains at least 600 micrograms (mcg) of folic acid.  If you have trouble pooping (constipation), take medicine that will make your stool soft (stool softener) if your doctor approves. Eating and drinking   Eat regular, healthy meals.  Avoid raw meat and uncooked cheese.  If you get low calcium from the food you eat, talk to your doctor about taking a daily calcium supplement.  Avoid foods that are high in fat and sugars, such as fried and sweet foods.  If you feel sick to your stomach (nauseous) or throw up (vomit): ? Eat 4 or 5 small meals a day instead of 3 large meals. ? Try eating a few soda crackers. ? Drink liquids between meals instead of during meals.   To prevent constipation: ? Eat foods that are high in fiber, like fresh fruits and vegetables, whole grains, and beans. ? Drink enough fluids to keep your pee (urine) clear or pale yellow. Activity  Exercise only as told by your doctor. Stop exercising if you start to have cramps.  Do not exercise if it is too hot, too humid, or if you are in a place of great height (high altitude).  Avoid heavy lifting.  Wear low-heeled shoes. Sit and stand up straight.  You can continue to have sex unless your doctor tells you not to. Relieving pain and discomfort  Wear a good support bra if your breasts are tender.  Take warm water baths (sitz baths) to soothe pain or discomfort caused by hemorrhoids. Use hemorrhoid cream if your doctor approves.  Rest with your legs raised if you have leg cramps or low back pain.  If you develop puffy, bulging veins (varicose veins) in your legs: ? Wear support hose or compression stockings as told by your doctor. ? Raise (elevate) your feet for 15 minutes, 3-4 times a day. ? Limit salt in your food. Prenatal care  Write down your questions. Take them to your prenatal visits.  Keep all your prenatal visits as told by your doctor. This is important. Safety  Wear your seat belt when driving.  Make a list of emergency phone numbers, including numbers for family, friends,  the hospital, and police and fire departments. General instructions  Ask your doctor about the right foods to eat or for help finding a counselor, if you need these services.  Ask your doctor about local prenatal classes. Begin classes before month 6 of your pregnancy.  Do not use hot tubs, steam rooms, or saunas.  Do not douche or use tampons or scented sanitary pads.  Do not cross your legs for long periods of time.  Visit your dentist if you have not done so. Use a soft toothbrush to brush your teeth. Floss gently.  Avoid all smoking, herbs, and alcohol. Avoid drugs that are not  approved by your doctor.  Do not use any products that contain nicotine or tobacco, such as cigarettes and e-cigarettes. If you need help quitting, ask your doctor.  Avoid cat litter boxes and soil used by cats. These carry germs that can cause birth defects in the baby and can cause a loss of your baby (miscarriage) or stillbirth. Contact a doctor if:  You have mild cramps or pressure in your lower belly.  You have pain when you pee (urinate).  You have bad smelling fluid coming from your vagina.  You continue to feel sick to your stomach (nauseous), throw up (vomit), or have watery poop (diarrhea).  You have a nagging pain in your belly area.  You feel dizzy. Get help right away if:  You have a fever.  You are leaking fluid from your vagina.  You have spotting or bleeding from your vagina.  You have severe belly cramping or pain.  You lose or gain weight rapidly.  You have trouble catching your breath and have chest pain.  You notice sudden or extreme puffiness (swelling) of your face, hands, ankles, feet, or legs.  You have not felt the baby move in over an hour.  You have severe headaches that do not go away when you take medicine.  You have trouble seeing. Summary  The second trimester is from week 14 through week 27 (months 4 through 6). This is often the time in pregnancy that you feel your best.  To take care of yourself and your unborn baby, you will need to eat healthy meals, take medicines only if your doctor tells you to do so, and do activities that are safe for you and your baby.  Call your doctor if you get sick or if you notice anything unusual about your pregnancy. Also, call your doctor if you need help with the right food to eat, or if you want to know what activities are safe for you. This information is not intended to replace advice given to you by your health care provider. Make sure you discuss any questions you have with your health care  provider. Document Released: 07/01/2009 Document Revised: 05/12/2016 Document Reviewed: 05/12/2016 Elsevier Interactive Patient Education  2019 [employer].

## 2018-09-19 NOTE — Progress Notes (Signed)
98% pulse ox

## 2018-09-19 NOTE — Progress Notes (Signed)
Cassandra [name] is a 28 y.o. G3P2002 at [redacted]w[redacted]d here for routine follow up.  She reports no RLQ pain. No bleeding, contractions, LOF. Feeling baby move.  See flow sheet for details. Planning to breastfeed.  Dad and 2 other children at home, excited about new baby. Taking PNV, no issues.  A/P: Pregnancy at [redacted]w[redacted]d.  Doing well.   Pregnancy issues include: - R benign ovarian mass - referral placed at [facility]. Waitlisted for appt. Advised to call to check status. Anatomy scan scheduled for tomorrow.  Preterm labor precautions reviewed. Follow up 4 weeks.

## 2018-09-20 ENCOUNTER — Other Ambulatory Visit (HOSPITAL_COMMUNITY): Payer: Self-pay | Admitting: *Deleted

## 2018-09-20 ENCOUNTER — Ambulatory Visit (HOSPITAL_COMMUNITY)
Admission: RE | Admit: 2018-09-20 | Discharge: 2018-09-20 | Disposition: A | Discharge status: Home / Self Care | Payer: Medicaid Other | Source: Ambulatory Visit | Attending: Obstetrics and Gynecology | Admitting: Obstetrics and Gynecology

## 2018-09-20 DIAGNOSIS — Z362 Encounter for other antenatal screening follow-up: Secondary | ICD-10-CM

## 2018-09-20 DIAGNOSIS — O2692 Pregnancy related conditions, unspecified, second trimester: Secondary | ICD-10-CM

## 2018-09-20 DIAGNOSIS — Z3481 Encounter for supervision of other normal pregnancy, first trimester: Secondary | ICD-10-CM | POA: Diagnosis not present

## 2018-09-20 DIAGNOSIS — Z363 Encounter for antenatal screening for malformations: Secondary | ICD-10-CM

## 2018-09-20 DIAGNOSIS — Z3A18 18 weeks gestation of pregnancy: Secondary | ICD-10-CM

## 2018-09-26 ENCOUNTER — Telehealth (INDEPENDENT_AMBULATORY_CARE_PROVIDER_SITE_OTHER): Payer: Medicaid Other | Admitting: Obstetrics and Gynecology

## 2018-09-26 ENCOUNTER — Other Ambulatory Visit: Payer: Self-pay

## 2018-09-26 DIAGNOSIS — Z3480 Encounter for supervision of other normal pregnancy, unspecified trimester: Secondary | ICD-10-CM | POA: Diagnosis not present

## 2018-09-26 DIAGNOSIS — N9489 Other specified conditions associated with female genital organs and menstrual cycle: Secondary | ICD-10-CM | POA: Diagnosis not present

## 2018-09-26 MED ORDER — AMBULATORY NON FORMULARY MEDICATION: 1.0000 | 0 refills | Status: DC

## 2018-09-26 NOTE — Progress Notes (Signed)
TELEHEALTH VIRTUAL OBSTETRICS CONSULT VISIT ENCOUNTER NOTE  I connected with Cassandra [name] on 09/26/18 at 10:55 AM EDT by telephone at home and verified that I am speaking with the correct person using two identifiers.   I discussed the limitations, risks, security and privacy concerns of performing an evaluation and management service by telephone and the availability of in person appointments. I also discussed with the patient that there may be a patient responsible charge related to this service. The patient expressed understanding and agreed to proceed.  Appointment Date: 09/26/2018  OBGYN Clinic: [facility]  Primary Care Provider: Rory Percy  Referring Provider: Zenia Resides, MD  Chief Complaint: dermoid cyst during pregnancy  History of Present Illness: Cassandra [name] is a 28 y.o. African-American [id number] (Patient's last menstrual period was 05/09/2018.), seen for the above chief complaint. Her past medical history is significant for nothing  Patient noted to have cyst during routine pregnancy care. Patient denies any pain, nausea, vomiting.   Review of Systems:  as noted in the History of Present Illness.   Past Medical History:  Past Medical History:  Diagnosis Date  . No pertinent past medical history     Past Surgical History:  Past Surgical History:  Procedure Laterality Date  . wisdom teeth  2009    Past Obstetrical History:  OB History  Gravida Para Term Preterm AB Living  _0 0 0 2  SAB TAB Ectopic Multiple Live Births  0 0 0 0 2    # Outcome Date GA Lbr Len/2nd Weight Sex Delivery Anes PTL Lv  3 Current           2 Term 05/27/13 62w1d02:13 / 00:20 6 lb 12.8 oz (3.084 kg) F Vag-Spont None  LIV  1 Term 01/14/12 464w2d3:14 / 03:36 8 lb 4.1 oz (3.745 kg) M Vag-Spont EPI  LIV    Obstetric Comments  Induced for postdates.     Social History:  Social History   Socioeconomic History   . Marital status: Significant Other    Spouse name: Not on file  . Number of children: 2  . Years of education: Not on file  . Highest education level: Not on file  Occupational History  . Not on file  Social Needs  . Financial resource strain: Not on file  . Food insecurity:    Worry: Not on file    Inability: Not on file  . Transportation needs:    Medical: Not on file    Non-medical: Not on file  Tobacco Use  . Smoking status: Never Smoker  . Smokeless tobacco: Never Used  Substance and Sexual Activity  . Alcohol use: No  . Drug use: No  . Sexual activity: Yes    Birth control/protection: None    Comment: implant removed in october 2019  Lifestyle  . Physical activity:    Days per week: Not on file    Minutes per session: Not on file  . Stress: Not on file  Relationships  . Social connections:    Talks on phone: Not on file    Gets together: Not on file    Attends religious service: Not on file    Active member of club or organization: Not on file    Attends meetings of clubs or organizations: Not on file    Relationship status: Not on file  . Intimate partner violence:    Fear of current or ex  partner: Not on file    Emotionally abused: Not on file    Physically abused: Not on file    Forced sexual activity: Not on file  Other Topics Concern  . Not on file  Social History Narrative  . Not on file    Family History:  Family History  Problem Relation Age of Onset  . Asthma Mother   . Diabetes Mother   . Asthma Sister   . Asthma Brother   . Cancer Maternal Uncle   . Hypertension Paternal Grandfather     Medications Cassandra [name] had no medications administered during this visit. Current Outpatient Medications  Medication Sig Dispense Refill  . acetaminophen (TYLENOL) 500 MG tablet Take 1 tablet (500 mg total) by mouth every 8 (eight) hours as needed for mild pain or moderate pain. 30 tablet 0  . AMBULATORY NON FORMULARY MEDICATION 1 Device by  Other route once a week. Blood Pressure Cuff/ Medium Monitored Regularly at home ICD 10: O09.90 1 kit 0   No current facility-administered medications for this visit.     Allergies Patient has no known allergies.   Physical Exam:  General:  Alert, oriented and cooperative.   Mental Status: Normal mood and affect perceived. Normal judgment and thought content.  Physical exam deferred due to nature of the encounter  Laboratory: no new labs  Radiology:  6/2 mfm anatomy u/s: negative anatomy. 6-7cm right adnexal mass 5/15: CLINICAL DATA:  Pregnant, follow-up solid right adnexal mass  EXAM: MRI PELVIS WITHOUT CONTRAST  TECHNIQUE: Multiplanar multisequence MR imaging of the pelvis was performed. No intravenous contrast was administered.  COMPARISON:  OB ultrasound dated 06/28/2018  FINDINGS: Urinary Tract:  Bladder is within normal limits.  No evidence of hydronephrosis bilaterally.  Bowel:  Visualized bowel is unremarkable.  Vascular/Lymphatic: No evidence of aneurysm.  No suspicious pelvic lymphadenopathy.  Reproductive: Gravid uterus. Cephalic presentation. Dedicated fetal evaluation not performed.  Placenta is anterior and free of the cervical os.  Left ovary is within normal limits (series 8/image 18).  5.8 x 6.6 cm right adnexal mass with signal loss on fat suppressed images (series 7/image 5), compatible with a benign ovarian dermoid, corresponding to the sonographic abnormality.  Other:  No pelvic ascites.  Musculoskeletal: No focal osseous lesions.  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  Gravid uterus. Dedicated fetal evaluation not performed. Placenta is anterior in free the cervical os.   Electronically Signed   By: Julian Hy M.D.   On: 09/04/2018 05:48  Assessment: pt stable  Plan:  1. Adnexal mass D/w her that findings are most consistent with a dermoid  cyst. I told her that it doesn't have any concerning findings that would make it worrisome for malignancy and need for removal during pregnancy. I told her that if benign features continue but if it gets to be above 10cm then consideration for removal at that time would be given. I told her that the concern is more so her risk of torsion. I told her that if she has any nausea, vomiting, severe abdominal pain, particularly on the right side, to come into the hospital for evaluation. I told her that if pregnancy continues to be uncomplicated that I would recommend repeat transvaginal ultrasound 6-12 weeks after delivery and then referral to GYN for further management of it. I told her that it will likely need to be removed postpartum given it's size and there is a 1% of malignancy in teratomas.  Patient is amenable to plan.   Patient already has an ultrasound scheduled by MFM for late August and this is fine and would recommend getting one q6wks just to watch the teratoma  RTC PRN  I discussed the assessment and treatment plan with the patient. The patient was provided an opportunity to ask questions and all were answered. The patient agreed with the plan and demonstrated an understanding of the instructions.   The patient was advised to call back or seek an in-person evaluation/go to the ED if the symptoms worsen or if the condition fails to improve as anticipated.  I provided 20 minutes of non-face-to-face time during this encounter. The visit was done via a MyChart Video visit.   Durene Romans MD Attending [facility] [employer] [facility])

## 2018-09-26 NOTE — Progress Notes (Signed)
I connected with  Cassandra [name] on 09/26/18 at 10:55 AM EDT by telephone and verified that I am speaking with the correct person using two identifiers.   I discussed the limitations, risks, security and privacy concerns of performing an evaluation and management service by telephone and the availability of in person appointments. I also discussed with the patient that there may be a patient responsible charge related to this service. The patient expressed understanding and agreed to proceed.  [place], [place] 09/26/2018  10:55 AM

## 2018-10-05 DIAGNOSIS — O0990 Supervision of high risk pregnancy, unspecified, unspecified trimester: Secondary | ICD-10-CM | POA: Diagnosis not present

## 2018-10-19 ENCOUNTER — Ambulatory Visit (HOSPITAL_COMMUNITY): Payer: Medicaid Other

## 2018-10-19 ENCOUNTER — Encounter (HOSPITAL_COMMUNITY): Payer: Self-pay

## 2018-11-04 ENCOUNTER — Ambulatory Visit (INDEPENDENT_AMBULATORY_CARE_PROVIDER_SITE_OTHER): Payer: Medicaid Other | Admitting: Family Medicine

## 2018-11-04 ENCOUNTER — Other Ambulatory Visit: Payer: Self-pay

## 2018-11-04 VITALS — BP 110/64 | HR 73 | Wt 159.0 lb

## 2018-11-04 DIAGNOSIS — Z3480 Encounter for supervision of other normal pregnancy, unspecified trimester: Secondary | ICD-10-CM

## 2018-11-04 MED ORDER — PRENATAL MULTIVITAMIN CH: 1.0000 | ORAL_TABLET | Freq: Every day | ORAL | 2 refills | Status: DC

## 2018-11-04 NOTE — Progress Notes (Signed)
Cassandra [name] is a 28 y.o. G3P2002 at [redacted]w[redacted]d here for routine follow up.  She reports no bleeding, LOF, contractions. Feeling baby move. A little bit of pain in RLQ when she is walking a lot. Doesn't take anything for it.    See flow sheet for details.  A/P: Pregnancy at [redacted]w[redacted]d.  Doing well.   Pregnancy issues include  - dermoid cyst during pregnancy - follows with OB/GYN. Last imaging 6/2 MFM [place] 6-7cm R adnexal mass. Follows up in 12wks for f/u scan, has appt 8/25. Plan to schedule surgery after delivery if cyst doesn't grow >10cm.   [facility]. Desires POPs for birth control Needs short term disability paperwork filled out for job.  Anatomy scan reviewed, problems are not noted.  Preterm labor precautions reviewed. Follow up 4 weeks.

## 2018-11-04 NOTE — Patient Instructions (Addendum)
It was great to see you!  Our plans for today:  - Follow up with OB/GYN and Ultrasound as scheduled.  - We provided the prescription for your prenatal vitamins. - We will call you when your paperwork is complete. We will fax this to your job. - Come back in 4 weeks for your next appointment.  Take care and seek immediate care sooner if you develop any concerns.   Dr. [facility]

## 2018-11-07 ENCOUNTER — Telehealth: Payer: Self-pay

## 2018-11-07 NOTE — Telephone Encounter (Signed)
Spoke with pt. Made OB appt for 11/17/2018 at 10:30. Salvatore Marvel, CMA

## 2018-11-09 ENCOUNTER — Telehealth: Payer: Self-pay | Admitting: Family Medicine

## 2018-11-09 NOTE — Telephone Encounter (Signed)
Patient left short term disability form to have time off from work for maternity leave at recent visit. On looking at form further, actually appears to be a return to work release. Spoke to patient and encouraged her to talk to her employer about receiving a form for short term disability or FMLA. Would then likely fill out return to work form once she is ready to return to work after maternity leave.   Nursing, should she provide a different form, the "return to work" form is in my box and can be returned to the patient. Told patient if this is indeed the form her employer requires, will fill out and attach letter stating intent for her to have maternity leave. Happy to answer any other questions she has.   Rory Percy, DO PGY-3, [facility] 11/09/2018 7:52 AM

## 2018-11-17 ENCOUNTER — Other Ambulatory Visit: Payer: Self-pay

## 2018-11-17 ENCOUNTER — Ambulatory Visit (INDEPENDENT_AMBULATORY_CARE_PROVIDER_SITE_OTHER): Payer: Medicaid Other | Admitting: Family Medicine

## 2018-11-17 VITALS — BP 102/58 | HR 93 | Temp 98.4°F | Wt 163.0 lb

## 2018-11-17 DIAGNOSIS — Z3482 Encounter for supervision of other normal pregnancy, second trimester: Secondary | ICD-10-CM

## 2018-11-17 LAB — POCT 1 HR PRENATAL GLUCOSE: Glucose 1 Hr Prenatal, POC: 112 mg/dL

## 2018-11-17 NOTE — Progress Notes (Addendum)
[facility]  JERI JEANBAPTISTE is a 28 y.o. [phone] at [redacted]w[redacted]d (via [redacted]w[redacted]d u/s) who presents to [facility] for routine follow up. Seen today along with resident Dr. Chauncey Reading. Prenatal course, history, notes, ultrasounds, and laboratory results reviewed.  Denies cramping/ctx, fluid leaking, vaginal bleeding, or decreased fetal movement. Taking PNV.  Does endorse vaginal pain at the end of the day and swelling in feet at the end of the day. Her vaginal pain is not assoc w ctx or bleeding, it's intermittent and sharp. 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   Postpartum plans: - Delivery planning: desires epidural - Circumcision: yes at [place]: [facility] (other kids go there) - Feeding: desires to attempt BF, previously had challenges related to supply and latch. Reviewed physiology of lactogenesis with patient today & discussed importance of frequent early feeds, skin to skin, and limiting unnecessary formula supplementation to help establish breastmilk supply. Encouraged her to utilize [facility] assistance during birth hospitalization. - Contraception: depo  Fundal height 29cm FHR 130bpm  Assessment & Plan  1. Routine prenatal care: - confirmed patient has personal history of varicella infection as a child, also history of prior vaccination to Hep B as a child - normal 1 hour GTT today (112) - reassured sharp pains likely pubic symphysis dysfunction versus round ligament pain - mild size-date discrepancy but on track, suspect dermoid contributing to discrepancy, already has upcoming ultrasound scheduled   2. R Dermoid cyst: known 6-7 cm dermoid cyst on R ovary. Seen by OB/GYN via telemedicine. Imaging suggests low suspicion for malignancy. - Reviewed signs/sx of torsion with patient & reasons to seek emergency care. -  Has f/u ultrasound scheduled on 8/25 via MFM, plan per GYN is to not consider removal while pregnant unless size increases >10cm or imaging suggests features of malignancy - Will need ultrasound 6-12 weeks postpartum and then referral to GYN for likely surgical resection when not pregnant.  Next prenatal visit in 2 weeks. Will need third trimester labs (CBC, HIV, RPR) and pregnancy medical home & PHQ-9 forms completed at that visit. Labor & fetal movement precautions discussed.  Chrisandra Netters, MD [place]

## 2018-11-17 NOTE — Patient Instructions (Addendum)
If you have any cramping/contractions, vaginal bleeding, fluid leaking, or are worried that baby is not moving normally, go immediately to [facility] to be evaluated.   Second Trimester of Pregnancy The second trimester is from week 14 through week 27 (months 4 through 6). The second trimester is often a time when you feel your best. Your body has adjusted to being pregnant, and you begin to feel better physically. Usually, morning sickness has lessened or quit completely, you may have more energy, and you may have an increase in appetite. The second trimester is also a time when the fetus is growing rapidly. At the end of the sixth month, the fetus is about 9 inches long and weighs about 1 pounds. You will likely begin to feel the baby move (quickening) between 16 and 20 weeks of pregnancy. Body changes during your second trimester Your body continues to go through many changes during your second trimester. The changes vary from woman to woman.  Your weight will continue to increase. You will notice your lower abdomen bulging out.  You may begin to get stretch marks on your hips, abdomen, and breasts.  You may develop headaches that can be relieved by medicines. The medicines should be approved by your health care provider.  You may urinate more often because the fetus is pressing on your bladder.  You may develop or continue to have heartburn as a result of your pregnancy.  You may develop constipation because certain hormones are causing the muscles that push waste through your intestines to slow down.  You may develop hemorrhoids or swollen, bulging veins (varicose veins).  You may have back pain. This is caused by: ? Weight gain. ? Pregnancy hormones that are relaxing the joints in your pelvis. ? A shift in weight and the muscles that support your balance.  Your breasts will continue to grow and they will continue to become tender.  Your gums may bleed and may be sensitive to  brushing and flossing.  Dark spots or blotches (chloasma, mask of pregnancy) may develop on your face. This will likely fade after the baby is born.  A dark line from your belly button to the pubic area (linea nigra) may appear. This will likely fade after the baby is born.  You may have changes in your hair. These can include thickening of your hair, rapid growth, and changes in texture. Some women also have hair loss during or after pregnancy, or hair that feels dry or thin. Your hair will most likely return to normal after your baby is born. What to expect at prenatal visits During a routine prenatal visit:  You will be weighed to make sure you and the fetus are growing normally.  Your blood pressure will be taken.  Your abdomen will be measured to track your baby's growth.  The fetal heartbeat will be listened to.  Any test results from the previous visit will be discussed. Your health care provider may ask you:  How you are feeling.  If you are feeling the baby move.  If you have had any abnormal symptoms, such as leaking fluid, bleeding, severe headaches, or abdominal cramping.  If you are using any tobacco products, including cigarettes, chewing tobacco, and electronic cigarettes.  If you have any questions. Other tests that may be performed during your second trimester include:  Blood tests that check for: ? Low iron levels (anemia). ? High blood sugar that affects pregnant women (gestational diabetes) between 47 and 28 weeks. ?  Rh antibodies. This is to check for a protein on red blood cells (Rh factor).  Urine tests to check for infections, diabetes, or protein in the urine.  An ultrasound to confirm the proper growth and development of the baby.  An amniocentesis to check for possible genetic problems.  Fetal screens for spina bifida and Down syndrome.  HIV (human immunodeficiency virus) testing. Routine prenatal testing includes screening for HIV, unless you  choose not to have this test. Follow these instructions at home: Medicines  Follow your health care provider's instructions regarding medicine use. Specific medicines may be either safe or unsafe to take during pregnancy.  Take a prenatal vitamin that contains at least 600 micrograms (mcg) of folic acid.  If you develop constipation, try taking a stool softener if your health care provider approves. Eating and drinking   Eat a balanced diet that includes fresh fruits and vegetables, whole grains, good sources of protein such as meat, eggs, or tofu, and low-fat dairy. Your health care provider will help you determine the amount of weight gain that is right for you.  Avoid raw meat and uncooked cheese. These carry germs that can cause birth defects in the baby.  If you have low calcium intake from food, talk to your health care provider about whether you should take a daily calcium supplement.  Limit foods that are high in fat and processed sugars, such as fried and sweet foods.  To prevent constipation: ? Drink enough fluid to keep your urine clear or pale yellow. ? Eat foods that are high in fiber, such as fresh fruits and vegetables, whole grains, and beans. Activity  Exercise only as directed by your health care provider. Most women can continue their usual exercise routine during pregnancy. Try to exercise for 30 minutes at least 5 days a week. Stop exercising if you experience uterine contractions.  Avoid heavy lifting, wear low heel shoes, and practice good posture.  A sexual relationship may be continued unless your health care provider directs you otherwise. Relieving pain and discomfort  Wear a good support bra to prevent discomfort from breast tenderness.  Take warm sitz baths to soothe any pain or discomfort caused by hemorrhoids. Use hemorrhoid cream if your health care provider approves.  Rest with your legs elevated if you have leg cramps or low back pain.  If you  develop varicose veins, wear support hose. Elevate your feet for 15 minutes, 3-4 times a day. Limit salt in your diet. Prenatal Care  Write down your questions. Take them to your prenatal visits.  Keep all your prenatal visits as told by your health care provider. This is important. Safety  Wear your seat belt at all times when driving.  Make a list of emergency phone numbers, including numbers for family, friends, the hospital, and police and fire departments. General instructions  Ask your health care provider for a referral to a local prenatal education class. Begin classes no later than the beginning of month 6 of your pregnancy.  Ask for help if you have counseling or nutritional needs during pregnancy. Your health care provider can offer advice or refer you to specialists for help with various needs.  Do not use hot tubs, steam rooms, or saunas.  Do not douche or use tampons or scented sanitary pads.  Do not cross your legs for long periods of time.  Avoid cat litter boxes and soil used by cats. These carry germs that can cause birth defects in the baby  and possibly loss of the fetus by miscarriage or stillbirth.  Avoid all smoking, herbs, alcohol, and unprescribed drugs. Chemicals in these products can affect the formation and growth of the baby.  Do not use any products that contain nicotine or tobacco, such as cigarettes and e-cigarettes. If you need help quitting, ask your health care provider.  Visit your dentist if you have not gone yet during your pregnancy. Use a soft toothbrush to brush your teeth and be gentle when you floss. Contact a health care provider if:  You have dizziness.  You have mild pelvic cramps, pelvic pressure, or nagging pain in the abdominal area.  You have persistent nausea, vomiting, or diarrhea.  You have a bad smelling vaginal discharge.  You have pain when you urinate. Get help right away if:  You have a fever.  You are leaking fluid  from your vagina.  You have spotting or bleeding from your vagina.  You have severe abdominal cramping or pain.  You have rapid weight gain or weight loss.  You have shortness of breath with chest pain.  You notice sudden or extreme swelling of your face, hands, ankles, feet, or legs.  You have not felt your baby move in over an hour.  You have severe headaches that do not go away when you take medicine.  You have vision changes. Summary  The second trimester is from week 14 through week 27 (months 4 through 6). It is also a time when the fetus is growing rapidly.  Your body goes through many changes during pregnancy. The changes vary from woman to woman.  Avoid all smoking, herbs, alcohol, and unprescribed drugs. These chemicals affect the formation and growth your baby.  Do not use any tobacco products, such as cigarettes, chewing tobacco, and e-cigarettes. If you need help quitting, ask your health care provider.  Contact your health care provider if you have any questions. Keep all prenatal visits as told by your health care provider. This is important. This information is not intended to replace advice given to you by your health care provider. Make sure you discuss any questions you have with your health care provider. Document Released: 03/31/2001 Document Revised: 07/29/2018 Document Reviewed: 05/12/2016 Elsevier Patient Education  2020 [employer].

## 2018-11-22 ENCOUNTER — Inpatient Hospital Stay (HOSPITAL_COMMUNITY)
Admission: AD | Admit: 2018-11-22 | Discharge: 2018-11-22 | Disposition: A | Discharge status: Home / Self Care | Payer: Medicaid Other | Attending: Family Medicine | Admitting: Family Medicine

## 2018-11-22 ENCOUNTER — Other Ambulatory Visit: Payer: Self-pay

## 2018-11-22 ENCOUNTER — Encounter (HOSPITAL_COMMUNITY): Payer: Self-pay | Admitting: *Deleted

## 2018-11-22 ENCOUNTER — Inpatient Hospital Stay (HOSPITAL_COMMUNITY): Payer: Medicaid Other

## 2018-11-22 DIAGNOSIS — N9489 Other specified conditions associated with female genital organs and menstrual cycle: Secondary | ICD-10-CM | POA: Diagnosis not present

## 2018-11-22 DIAGNOSIS — R109 Unspecified abdominal pain: Secondary | ICD-10-CM | POA: Insufficient documentation

## 2018-11-22 DIAGNOSIS — O26892 Other specified pregnancy related conditions, second trimester: Secondary | ICD-10-CM | POA: Insufficient documentation

## 2018-11-22 DIAGNOSIS — Z3689 Encounter for other specified antenatal screening: Secondary | ICD-10-CM

## 2018-11-22 DIAGNOSIS — O9989 Other specified diseases and conditions complicating pregnancy, childbirth and the puerperium: Secondary | ICD-10-CM | POA: Diagnosis not present

## 2018-11-22 DIAGNOSIS — Z3A27 27 weeks gestation of pregnancy: Secondary | ICD-10-CM | POA: Diagnosis not present

## 2018-11-22 DIAGNOSIS — D270 Benign neoplasm of right ovary: Secondary | ICD-10-CM | POA: Diagnosis not present

## 2018-11-22 LAB — URINALYSIS, ROUTINE W REFLEX MICROSCOPIC
Bilirubin Urine: NEGATIVE
Glucose, UA: NEGATIVE mg/dL
Hgb urine dipstick: NEGATIVE
Ketones, ur: NEGATIVE mg/dL
Nitrite: NEGATIVE
Protein, ur: NEGATIVE mg/dL
Specific Gravity, Urine: 1.009 (ref 1.005–1.030)
pH: 6 (ref 5.0–8.0)

## 2018-11-22 MED ORDER — TRAMADOL HCL 50 MG PO TABS
50.0000 mg | ORAL_TABLET | Freq: Once | ORAL | Status: AC
  Administered 2018-11-22: 50 mg via ORAL
  Filled 2018-11-22: qty 1

## 2018-11-22 NOTE — MAU Note (Signed)
Pt presents to MAU c/o LRQ pain pt reports a cyst on that side and states today the pain is not going away. Pt states the pain is a 8/10 and no relief from tylenol. +FM. No bleeding or LOF.

## 2018-11-22 NOTE — MAU Provider Note (Signed)
History     CSN: [id number]  Arrival date and time: 11/22/18 1943   First Provider Initiated Contact with Patient 11/22/18 2145      Chief Complaint  Patient presents with  . Abdominal Pain   Cassandra [name] is a 28 y.o. G3P2002 at 62w3dwho [facility]  She presents today for Abdominal Pain. She reports she started having stabbing pains on her right side around 6pm.  She states the pain was different then the "usual pain I have."  She states that it was different because it was constant and seemed like it was not going away.  Patient endorses continued pain and states it was a 10/10 and reports it is a 6/10 as she took tylenol immediately.  She endorses fetal movement and denies contractions or vaginal concerns including bleeding, leaking, and discharge.  .      OB History    Gravida  3   Para  2   Term  2   Preterm  0   AB  0   Living  2     SAB  0   TAB  0   Ectopic  0   Multiple  0   Live Births  2        Obstetric Comments  Induced for postdates.         Past Medical History:  Diagnosis Date  . No pertinent past medical history     Past Surgical History:  Procedure Laterality Date  . wisdom teeth  2009    Family History  Problem Relation Age of Onset  . Asthma Mother   . Diabetes Mother   . Asthma Sister   . Asthma Brother   . Cancer Maternal Uncle   . Hypertension Paternal Grandfather     Social History   Tobacco Use  . Smoking status: Never Smoker  . Smokeless tobacco: Never Used  Substance Use Topics  . Alcohol use: No  . Drug use: No    Allergies: No Known Allergies  Medications Prior to Admission  Medication Sig Dispense Refill Last Dose  . acetaminophen (TYLENOL) 500 MG tablet Take 1 tablet (500 mg total) by mouth every 8 (eight) hours as needed for mild pain or moderate pain. 30 tablet 0 11/22/2018 at Unknown time  . AMBULATORY NON FORMULARY MEDICATION 1 Device by Other route once a week. Blood  Pressure Cuff/ Medium Monitored Regularly at home ICD 10: O09.90 1 kit 0 Past Month at Unknown time  . Prenatal Vit-Fe Fumarate-FA (PRENATAL MULTIVITAMIN) TABS tablet Take 1 tablet by mouth daily. 90 tablet 2 11/22/2018 at Unknown time    Review of Systems  Constitutional: Negative for chills and fever.  Eyes: Negative for visual disturbance.  Respiratory: Negative for cough and shortness of breath.   Gastrointestinal: Positive for abdominal pain. Negative for constipation, diarrhea, nausea and vomiting.  Genitourinary: Negative for difficulty urinating, dysuria, vaginal bleeding and vaginal discharge.  Neurological: Negative for dizziness, light-headedness and headaches.   Physical Exam   Blood pressure 115/76, pulse 73, temperature 98.3 F (36.8 C), temperature source Oral, resp. rate 17, weight 73.8 kg, last menstrual period 05/09/2018.  Physical Exam  Constitutional: She is oriented to person, place, and time. She appears well-developed and well-nourished. No distress.  HENT:  Head: Normocephalic and atraumatic.  Eyes: Conjunctivae are normal.  Neck: Normal range of motion.  Cardiovascular: Normal rate.  Respiratory: Effort normal.  GI: Soft. There is abdominal tenderness (Right Side with  Deep Palpation).  Genitourinary:    Genitourinary Comments: Deferred   Musculoskeletal: Normal range of motion.  Neurological: She is alert and oriented to person, place, and time.  Skin: Skin is warm and dry.  Psychiatric: She has a normal mood and affect. Her behavior is normal.    Fetal Assessment 135 bpm, Mod Var, -Decels, +Accels Toco: None graphed  MAU Course   Results for orders placed or performed during the hospital encounter of 11/22/18 (from the past 24 hour(s))  Urinalysis, Routine w reflex microscopic     Status: Abnormal   Collection Time: 11/22/18  8:30 PM  Result Value Ref Range   Color, Urine STRAW (A) YELLOW   APPearance CLEAR CLEAR   Specific Gravity, Urine 1.009  1.005 - 1.030   pH 6.0 5.0 - 8.0   Glucose, UA NEGATIVE NEGATIVE mg/dL   Hgb urine dipstick NEGATIVE NEGATIVE   Bilirubin Urine NEGATIVE NEGATIVE   Ketones, ur NEGATIVE NEGATIVE mg/dL   Protein, ur NEGATIVE NEGATIVE mg/dL   Nitrite NEGATIVE NEGATIVE   Leukocytes,Ua TRACE (A) NEGATIVE   RBC / HPF 0-5 0 - 5 RBC/hpf   WBC, UA 6-10 0 - 5 WBC/hpf   Bacteria, UA RARE (A) NONE SEEN   Squamous Epithelial / LPF 0-5 0 - 5   US Pelvis (transabdominal Only)  Result Date: 11/22/2018 CLINICAL DATA:  Twenty-seven weeks pregnant. Right abdominal pain. Evaluate for torsion. EXAM: TRANSABDOMINAL ULTRASOUND OF PELVIS DOPPLER ULTRASOUND OF OVARIES TECHNIQUE: Transabdominal ultrasound examination of the pelvis was performed including evaluation of the uterus, ovaries, adnexal regions, and pelvic cul-de-sac. Color and duplex Doppler ultrasound was utilized to evaluate blood flow to the ovaries. COMPARISON:  MRI pelvis 09/02/2018 FINDINGS: Uterus Measurements: Gravid uterus Endometrium Thickness: Gravid. Right ovary Measurements: Right ovarian mass seen measuring 6.1 x 6.7 x 5.5 cm compatible with known dermoid, seen on prior MRI. Left ovary Measurements: 4.3 x 1.8 x 1.9 cm = volume: 7.8 mL. Normal appearance/no adnexal mass. Pulsed Doppler evaluation demonstrates normal low-resistance arterial and venous waveforms in both ovaries. Other: No free fluid. 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 No evidence of torsion. Electronically Signed   By: Rolm Baptise M.D.   On: 11/22/2018 23:03   US Pelvic Doppler (torsion R/o Or Mass Arterial Flow)  Result Date: 11/22/2018 CLINICAL DATA:  Twenty-seven weeks pregnant. Right abdominal pain. Evaluate for torsion. EXAM: TRANSABDOMINAL ULTRASOUND OF PELVIS DOPPLER ULTRASOUND OF OVARIES TECHNIQUE: Transabdominal ultrasound examination of the pelvis was performed including evaluation of the uterus, ovaries, adnexal regions, and pelvic cul-de-sac. Color and  duplex Doppler ultrasound was utilized to evaluate blood flow to the ovaries. COMPARISON:  MRI pelvis 09/02/2018 FINDINGS: Uterus Measurements: Gravid uterus Endometrium Thickness: Gravid. Right ovary Measurements: Right ovarian mass seen measuring 6.1 x 6.7 x 5.5 cm compatible with known dermoid, seen on prior MRI. Left ovary Measurements: 4.3 x 1.8 x 1.9 cm = volume: 7.8 mL. Normal appearance/no adnexal mass. Pulsed Doppler evaluation demonstrates normal low-resistance arterial and venous waveforms in both ovaries. Other: No free fluid. 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 No evidence of torsion. Electronically Signed   By: Rolm Baptise M.D.   On: 11/22/2018 23:03    MDM PE Labs: UA EFM Pain Medication Assessment and Plan  28 year old G3P2002  SIUP at 27.3 weeks Cat I FT Abdominal Pain-Right Side Known Dermoid Cyst  -Exam findings discussed -Offered and accepts pain medication. -Give Tramadol 30m now -  Will send for [place] to r/o Torsion.  Reassessment (11:14 PM) No Ovarian Torsion Cyst Stable  -Results reviewed with patient and FOB. -Patient reports pain is no longer constant, but intermittent in nature.  -Discussed usage of belly support band or belt to promote comfort. -Will take out of work for the next 2 days for rest.  Can return to work Friday. -Patient declines medication stating she will continue to take her tylenol. -Preterm Labor Precautions Given. -Keep scheduled appts: August 17th and August 25 ([place]). -Encouraged to call or return to MAU if symptoms worsen or with the onset of new symptoms. -Discharged to home in stable condition.   Maryann Conners MSN, CNM 11/22/2018, 9:46 PM

## 2018-11-22 NOTE — Discharge Instructions (Signed)
Ovarian Cyst     An ovarian cyst is a fluid-filled sac that forms on an ovary. The ovaries are small organs that produce eggs in women. Various types of cysts can form on the ovaries. Some may cause symptoms and require treatment. Most ovarian cysts go away on their own, are not cancerous (are benign), and do not cause problems. Common types of ovarian cysts include:  Functional (follicle) cysts. ? Occur during the menstrual cycle, and usually go away with the next menstrual cycle if you do not get pregnant. ? Usually cause no symptoms.  Endometriomas. ? Are cysts that form from the tissue that lines the uterus (endometrium). ? Are sometimes called "chocolate cysts" because they become filled with blood that turns brown. ? Can cause pain in the lower abdomen during intercourse and during your period.  Cystadenoma cysts. ? Develop from cells on the outside surface of the ovary. ? Can get very large and cause lower abdomen pain and pain with intercourse. ? Can cause severe pain if they twist or break open (rupture).  Dermoid cysts. ? Are sometimes found in both ovaries. ? May contain different kinds of body tissue, such as skin, teeth, hair, or cartilage. ? Usually do not cause symptoms unless they get very big.  Theca lutein cysts. ? Occur when too much of a certain hormone (human chorionic gonadotropin) is produced and overstimulates the ovaries to produce an egg. ? Are most common after having procedures used to assist with the conception of a baby (in vitro fertilization). What are the causes? Ovarian cysts may be caused by:  Ovarian hyperstimulation syndrome. This is a condition that can develop from taking fertility medicines. It causes multiple large ovarian cysts to form.  Polycystic ovarian syndrome (PCOS). This is a common hormonal disorder that can cause ovarian cysts, as well as problems with your period or fertility. What increases the risk? The following factors may  make you more likely to develop ovarian cysts:  Being overweight or obese.  Taking fertility medicines.  Taking certain forms of hormonal birth control.  Smoking. What are the signs or symptoms? Many ovarian cysts do not cause symptoms. If symptoms are present, they may include:  Pelvic pain or pressure.  Pain in the lower abdomen.  Pain during sex.  Abdominal swelling.  Abnormal menstrual periods.  Increasing pain with menstrual periods. How is this diagnosed? These cysts are commonly found during a routine pelvic exam. You may have tests to find out more about the cyst, such as:  Ultrasound.  X-ray of the pelvis.  CT scan.  MRI.  Blood tests. How is this treated? Many ovarian cysts go away on their own without treatment. Your health care provider may want to check your cyst regularly for 2-3 months to see if it changes. If you are in menopause, it is especially important to have your cyst monitored closely because menopausal women have a higher rate of ovarian cancer. When treatment is needed, it may include:  Medicines to help relieve pain.  A procedure to drain the cyst (aspiration).  Surgery to remove the whole cyst.  Hormone treatment or birth control pills. These methods are sometimes used to help dissolve a cyst. Follow these instructions at home:  Take over-the-counter and prescription medicines only as told by your health care provider.  Do not drive or use heavy machinery while taking prescription pain medicine.  Get regular pelvic exams and Pap tests as often as told by your health care provider.    Return to your normal activities as told by your health care provider. Ask your health care provider what activities are safe for you.  Do not use any products that contain nicotine or tobacco, such as cigarettes and e-cigarettes. If you need help quitting, ask your health care provider.  Keep all follow-up visits as told by your health care provider.  This is important. Contact a health care provider if:  Your periods are late, irregular, or painful, or they stop.  You have pelvic pain that does not go away.  You have pressure on your bladder or trouble emptying your bladder completely.  You have pain during sex.  You have any of the following in your abdomen: ? A feeling of fullness. ? Pressure. ? Discomfort. ? Pain that does not go away. ? Swelling.  You feel generally ill.  You become constipated.  You lose your appetite.  You develop severe acne.  You start to have more body hair and facial hair.  You are gaining weight or losing weight without changing your exercise and eating habits.  You think you may be pregnant. Get help right away if:  You have abdominal pain that is severe or gets worse.  You cannot eat or drink without vomiting.  You suddenly develop a fever.  Your menstrual period is much heavier than usual. This information is not intended to replace advice given to you by your health care provider. Make sure you discuss any questions you have with your health care provider. Document Released: 04/06/2005 Document Revised: 07/05/2017 Document Reviewed: 09/08/2015 Elsevier Patient Education  2020 [employer]  

## 2018-11-29 ENCOUNTER — Telehealth: Payer: Self-pay | Admitting: *Deleted

## 2018-11-29 NOTE — Telephone Encounter (Signed)
Pt is requesting a letter that allows her to sit periodically @ work.  She states that she stands for 8 hours and it is very painful.   She thinks the pain is coming from her cyst and it feels better when she sits. Christen Bame, CMA

## 2018-11-30 ENCOUNTER — Encounter: Payer: Self-pay | Admitting: Family Medicine

## 2018-11-30 NOTE — Telephone Encounter (Signed)
Sent letter via MyChart. 

## 2018-12-05 ENCOUNTER — Inpatient Hospital Stay (HOSPITAL_COMMUNITY)
Admission: AD | Admit: 2018-12-05 | Discharge: 2018-12-06 | Disposition: A | Discharge status: Home / Self Care | Payer: Medicaid Other | Attending: Obstetrics and Gynecology | Admitting: Obstetrics and Gynecology

## 2018-12-05 ENCOUNTER — Inpatient Hospital Stay (HOSPITAL_COMMUNITY): Payer: Medicaid Other

## 2018-12-05 ENCOUNTER — Encounter (HOSPITAL_COMMUNITY): Payer: Self-pay

## 2018-12-05 ENCOUNTER — Other Ambulatory Visit: Payer: Self-pay

## 2018-12-05 ENCOUNTER — Encounter: Payer: Self-pay | Admitting: Family Medicine

## 2018-12-05 ENCOUNTER — Ambulatory Visit (INDEPENDENT_AMBULATORY_CARE_PROVIDER_SITE_OTHER): Payer: Medicaid Other | Admitting: Family Medicine

## 2018-12-05 VITALS — BP 114/64 | HR 74 | Wt 167.0 lb

## 2018-12-05 DIAGNOSIS — Z3480 Encounter for supervision of other normal pregnancy, unspecified trimester: Secondary | ICD-10-CM

## 2018-12-05 DIAGNOSIS — R1031 Right lower quadrant pain: Secondary | ICD-10-CM | POA: Diagnosis not present

## 2018-12-05 DIAGNOSIS — O26893 Other specified pregnancy related conditions, third trimester: Secondary | ICD-10-CM | POA: Diagnosis not present

## 2018-12-05 DIAGNOSIS — O3483 Maternal care for other abnormalities of pelvic organs, third trimester: Secondary | ICD-10-CM | POA: Insufficient documentation

## 2018-12-05 DIAGNOSIS — R102 Pelvic and perineal pain: Secondary | ICD-10-CM | POA: Diagnosis not present

## 2018-12-05 DIAGNOSIS — Z3A29 29 weeks gestation of pregnancy: Secondary | ICD-10-CM | POA: Diagnosis not present

## 2018-12-05 DIAGNOSIS — R109 Unspecified abdominal pain: Secondary | ICD-10-CM | POA: Diagnosis not present

## 2018-12-05 DIAGNOSIS — N83201 Unspecified ovarian cyst, right side: Secondary | ICD-10-CM | POA: Diagnosis not present

## 2018-12-05 DIAGNOSIS — N83209 Unspecified ovarian cyst, unspecified side: Secondary | ICD-10-CM

## 2018-12-05 MED ORDER — OXYCODONE-ACETAMINOPHEN 5-325 MG PO TABS
2.0000 | ORAL_TABLET | Freq: Once | ORAL | Status: AC
  Administered 2018-12-05: 2 via ORAL
  Filled 2018-12-05: qty 2

## 2018-12-05 MED ORDER — ONDANSETRON 4 MG PO TBDP
8.0000 mg | ORAL_TABLET | Freq: Once | ORAL | Status: AC
  Administered 2018-12-05: 8 mg via ORAL
  Filled 2018-12-05: qty 2

## 2018-12-05 NOTE — Progress Notes (Signed)
Cassandra [name] is a 28 y.o. G3P2002 at [redacted]w[redacted]d here for routine follow up.  She reports feeling baby move, no contractions or bleeding or LOF. See flow sheet for details.  Went to MAU 11/22/18 for intense RLQ pain, received tramadol with relief. [place] negative for torsion.  A/P: Pregnancy at [redacted]w[redacted]d.  Doing well.   Pregnancy issues include dermoid mass.  Has follow-up ultrasound 8/25 via [facility]FM.  Plan is to remove via surgery 6-12 weeks postpartum unless mass enlarges >10cm.  Infant feeding choice: breast Contraception choice: POPs Infant circumcision desired yes, outpatient  Tdap was not given today, cuirrently not in stock. Will receive at follow up. 1 hour glucola completed at last visit, normal. CBC, RPR, and HIV were done today.   Pregnancy medical home and PHQ-9 forms were done today and reviewed.   Rh status was reviewed and patient does not need Rhogam.  Rhogam was not given today.   Childbirth and education classes were offered. Patient declines.  Preterm labor and fetal movement precautions reviewed. Follow up 2 weeks.

## 2018-12-05 NOTE — Patient Instructions (Signed)
It was great to see you!  Our plans for today:  - We will release your lab results to [place].  - If you have intense pain again, bleeding, or leakage of fluid you should go to the ED.  Take care and seek immediate care sooner if you develop any concerns.   Dr. [facility]  SAFE MEDICATIONS IN PREGNANCY  Acne:  Benzoyl Peroxide  Salicylic Acid   Backache/Headache:  Tylenol: 2 regular strength every 4 hours OR        2 Extra strength every 6 hours   Colds/Coughs/Allergies:  Benadryl (alcohol free) 25 mg every 6 hours as needed  Breath right strips  Claritin  Cepacol throat lozenges  Chloraseptic throat spray  Cold-Eeze- up to three times per day  Cough drops, alcohol free  Flonase (by prescription only)  Guaifenesin  Mucinex  Robitussin DM (plain only, alcohol free)  Saline nasal spray/drops  Sudafed (pseudoephedrine) & Actifed * use only after [redacted] weeks gestation and if you do not have high blood pressure  Tylenol  Vicks Vaporub  Zinc lozenges  Zyrtec   Constipation:  Colace  Ducolax suppositories  Fleet enema  Glycerin suppositories  Metamucil  Milk of magnesia  Miralax  Senokot  Smooth move tea   Diarrhea:  Kaopectate  Imodium A-D   *NO pepto Bismol   Hemorrhoids:  Anusol  Anusol HC  Preparation H  Tucks   Indigestion:  Tums  Maalox  Mylanta  Zantac  Pepcid   Insomnia:  Benadryl (alcohol free) 25mg  every 6 hours as needed  Tylenol PM  Unisom, no Gelcaps   Leg Cramps:  Tums  MagGel   Nausea/Vomiting:  Bonine  Dramamine  Emetrol  Ginger extract  Sea bands  Meclizine  Nausea medication to take during pregnancy:  Unisom (doxylamine succinate 25 mg tablets) Take one tablet daily at bedtime. If symptoms are not adequately controlled, the dose can be increased to a maximum recommended dose of two tablets daily (1/2 tablet in the morning, 1/2 tablet mid-afternoon and one at bedtime).  Vitamin B6 100mg  tablets.  Take one tablet twice a day (up to 200 mg per day).   Skin Rashes:  Aveeno products  Benadryl cream or 25mg  every 6 hours as needed  Calamine Lotion  1% cortisone cream   Yeast infection:  Gyne-lotrimin 7  Monistat 7    **If taking multiple medications, please check labels to avoid duplicating the same active ingredients  **take medication as directed on the label  ** Do not exceed 4000 mg of tylenol in 24 hours  **Do not take medications that contain aspirin or ibuprofen

## 2018-12-05 NOTE — MAU Note (Signed)
Patient presents to MAU c/o of R side abdominal pain. Patient reports having a cyst on right ovary. Patient reports taking 2 doses of tylenol with no relief.  +FM, denies LOF and vaginal bleeding.

## 2018-12-05 NOTE — MAU Provider Note (Signed)
Chief Complaint:  Abdominal Pain (R side)   First Provider Initiated Contact with Patient 12/05/18 2213     HPI: Cassandra [name] is a 28 y.o. G3P2002 at [redacted]w[redacted]d who presents to maternity admissions reporting abdominal pain. 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 Has been seen in MAU for abdominal pain. Current symptoms started around 6 pm. Reports constant pain in right lower quadrant that feels like previous pain she's had r/t ovarian cyst. Has had 3 episodes of vomiting. She states n/v is due to pain. Denies contractions, dysuria, vaginal bleeding, or LOF. Good fetal movement.  Location: abdomen Quality: stabbing Severity: 10/10 in pain scale Duration: 6 hours Timing: constant Modifying factors: none Associated signs and symptoms: n/v    Past Medical History:  Diagnosis Date  . No pertinent past medical history    OB History  Gravida Para Term Preterm AB Living  3 2 2  0 0 2  SAB TAB Ectopic Multiple Live Births  0 0 0 0 2    # Outcome Date GA Lbr Len/2nd Weight Sex Delivery Anes PTL Lv  3 Current           2 Term 05/27/13 [redacted]w[redacted]d 02:13 / 00:20 3084 g F Vag-Spont None  LIV  1 Term 01/14/12 [redacted]w[redacted]d 13:14 / 03:36 3745 g M Vag-Spont EPI  LIV    Obstetric Comments  Induced for postdates.    Past Surgical History:  Procedure Laterality Date  . wisdom teeth  2009   Family History  Problem Relation Age of Onset  . Asthma Mother   . Diabetes Mother   . Asthma Sister   . Asthma Brother   . Cancer Maternal Uncle   . Hypertension Paternal Grandfather    Social History   Tobacco Use  . Smoking status: Never Smoker  . Smokeless tobacco: Never Used  Substance Use Topics  . Alcohol use: No  . Drug use: No   No Known Allergies No medications prior to admission.    I have reviewed patient's Past Medical Hx, Surgical Hx, Family Hx, Social Hx, medications and allergies.   ROS:  Review of Systems  Constitutional: Negative.   Gastrointestinal: Positive for abdominal  pain and vomiting.  Genitourinary: Negative.     Physical Exam   Patient Vitals for the past 24 hrs:  BP Temp Temp src Pulse Resp SpO2 Height Weight  12/06/18 0018 104/63 98.1 F (36.7 C) Oral 79 17 99 % - -  12/05/18 2120 123/73 - - 78 - - - -  12/05/18 2101 121/76 98.4 F (36.9 C) Oral 73 18 100 % 5\' 4"  (1.626 m) 75.9 kg    Constitutional: Well-developed, well-nourished female. Pt appears uncomfortable Cardiovascular: normal rate & rhythm, no murmur Respiratory: normal effort, lung sounds clear throughout GI: Abd soft, non-tender, gravid appropriate for gestational age. Pos BS x 4 MS: Extremities nontender, no edema, normal ROM Neurologic: Alert and oriented x 4.  GU:   Dilation: Closed Effacement (%): Thick Cervical Position: Posterior Station: -3 Exam by:: Jorje Guild NP  NST:  Baseline: 125 bpm, Variability: Good {> 6 bpm), Accelerations: Non-reactive but appropriate for gestational age and Decelerations: Absent   Labs: No results found for this or any previous visit (from the past 24 hour(s)).  Imaging:  US Pelvis (transabdominal Only)  Result Date: 12/05/2018 CLINICAL DATA:  Increasing pain. EXAM: TRANSABDOMINAL ULTRASOUND OF PELVIS DOPPLER ULTRASOUND OF OVARIES TECHNIQUE: Transabdominal ultrasound examination of the pelvis was performed including evaluation of the uterus,  ovaries, adnexal regions, and pelvic cul-de-sac. Color and duplex Doppler ultrasound was utilized to evaluate blood flow to the ovaries. COMPARISON:  November 22, 2018 FINDINGS: Uterus There is a gravid uterus consistent with the patient's history of Right ovary The right ovary was not visualized. Left ovary Measurements: 2.9 x 1.5 x 2.2 cm = volume: 4.8 mL. Normal appearance/no adnexal mass. Pulsed Doppler evaluation demonstrates normal low-resistance arterial and venous waveforms in the left ovary. Other: None IMPRESSION: 1. The right ovary was not visualized secondary to overlying bowel gas. 2.  Unremarkable appearance of the left ovary. 3. Gravid uterus. Electronically Signed   By: Constance Holster M.D.   On: 12/05/2018 23:31   US Pelvic Doppler (torsion R/o Or Mass Arterial Flow)  Result Date: 12/05/2018 CLINICAL DATA:  Increasing pain. EXAM: TRANSABDOMINAL ULTRASOUND OF PELVIS DOPPLER ULTRASOUND OF OVARIES TECHNIQUE: Transabdominal ultrasound examination of the pelvis was performed including evaluation of the uterus, ovaries, adnexal regions, and pelvic cul-de-sac. Color and duplex Doppler ultrasound was utilized to evaluate blood flow to the ovaries. COMPARISON:  November 22, 2018 FINDINGS: Uterus There is a gravid uterus consistent with the patient's history of Right ovary The right ovary was not visualized. Left ovary Measurements: 2.9 x 1.5 x 2.2 cm = volume: 4.8 mL. Normal appearance/no adnexal mass. Pulsed Doppler evaluation demonstrates normal low-resistance arterial and venous waveforms in the left ovary. Other: None IMPRESSION: 1. The right ovary was not visualized secondary to overlying bowel gas. 2. Unremarkable appearance of the left ovary. 3. Gravid uterus. Electronically Signed   By: Constance Holster M.D.   On: 12/05/2018 23:31    MAU Course: Orders Placed This Encounter  Procedures  . US PELVIS (TRANSABDOMINAL ONLY)  . US PELVIC DOPPLER (TORSION R/O OR MASS ARTERIAL FLOW)  . Discharge patient   Meds ordered this encounter  Medications  . ondansetron (ZOFRAN-ODT) disintegrating tablet 8 mg  . oxyCODONE-acetaminophen (PERCOCET/ROXICET) 5-325 MG per tablet 2 tablet  . oxyCODONE (ROXICODONE) 5 MG immediate release tablet    Sig: Take 1 tablet (5 mg total) by mouth every 4 (four) hours as needed for up to 3 days for severe pain.    Dispense:  18 tablet    Refill:  0    Order Specific Question:   Supervising Provider    Answer:   Sloan Leiter [[id number]]    MDM: Pt presents with severe abdominal pain c/w previous pain r/t known ovarian cyst. Some irregular ctx on  monitor. Abdomen palpates soft. Cervix closed/thick/firm. Denies presence of contractions. Pt afebrile.   Ultrasound performed to assess ovarian cyst. Unable to see ovarian cyst or ovary due to overlying gas.  Pt returned from ultrasound reporting improvement in symptoms. Pain now 2/10.  Will send home with small rx oxycodone. Discussed continued use of tylenol & reserve oxycodone for worsening pain. To return to MAU if pain not improved with pain medication.   Assessment: 1. Abdominal pain during pregnancy in third trimester   2. [redacted] weeks gestation of pregnancy   3. Ovarian cyst affecting pregnancy in third trimester, antepartum     Plan: Discharge home in stable condition.  Preterm Labor precautions and fetal kick counts Torsion precautions Rx oxycodone #18  Follow-up Information    [facility] Follow up.   Specialty: Obstetrics and Gynecology Why: return for worsening symptoms Contact information: [address] [id number] [place] [phone]          Allergies as of 12/06/2018  No Known Allergies     Medication List    TAKE these medications   acetaminophen 500 MG tablet Commonly known as: TYLENOL Take 1 tablet (500 mg total) by mouth every 8 (eight) hours as needed for mild pain or moderate pain.   AMBULATORY NON FORMULARY MEDICATION 1 Device by Other route once a week. Blood Pressure Cuff/ Medium Monitored Regularly at home ICD 10: O09.90   oxyCODONE 5 MG immediate release tablet Commonly known as: Roxicodone Take 1 tablet (5 mg total) by mouth every 4 (four) hours as needed for up to 3 days for severe pain.   prenatal multivitamin Tabs tablet Take 1 tablet by mouth daily.       Jorje Guild, NP 12/06/2018 4:36 AM

## 2018-12-06 DIAGNOSIS — R109 Unspecified abdominal pain: Secondary | ICD-10-CM | POA: Diagnosis not present

## 2018-12-06 DIAGNOSIS — Z3A29 29 weeks gestation of pregnancy: Secondary | ICD-10-CM

## 2018-12-06 DIAGNOSIS — O26893 Other specified pregnancy related conditions, third trimester: Secondary | ICD-10-CM

## 2018-12-06 LAB — HIV ANTIBODY (ROUTINE TESTING W REFLEX): HIV Screen 4th Generation wRfx: NONREACTIVE

## 2018-12-06 LAB — CBC
Hematocrit: 29.4 % — ABNORMAL LOW (ref 34.0–46.6)
Hemoglobin: 10.1 g/dL — ABNORMAL LOW (ref 11.1–15.9)
MCH: 29.8 pg (ref 26.6–33.0)
MCHC: 34.4 g/dL (ref 31.5–35.7)
MCV: 87 fL (ref 79–97)
Platelets: 215 10*3/uL (ref 150–450)
RBC: 3.39 x10E6/uL — ABNORMAL LOW (ref 3.77–5.28)
RDW: 12.3 % (ref 11.7–15.4)
WBC: 8.2 10*3/uL (ref 3.4–10.8)

## 2018-12-06 LAB — RPR: RPR Ser Ql: NONREACTIVE

## 2018-12-06 MED ORDER — OXYCODONE HCL 5 MG PO TABS: 5.0000 mg | ORAL_TABLET | ORAL | 0 refills | Status: AC | PRN

## 2018-12-06 MED ORDER — IRON 325 (65 FE) MG PO TABS: 1.0000 | ORAL_TABLET | ORAL | 0 refills | Status: DC

## 2018-12-06 MED ORDER — IRON 325 (65 FE) MG PO TABS: 1.0000 | ORAL_TABLET | Freq: Every day | ORAL | 0 refills | Status: DC

## 2018-12-06 NOTE — Discharge Instructions (Signed)
Abdominal Pain During Pregnancy  Abdominal pain is common during pregnancy, and has many possible causes. Some causes are more serious than others, and sometimes the cause is not known. Abdominal pain can be a sign that labor is starting. It can also be caused by normal growth and stretching of muscles and ligaments during pregnancy. Always tell your health care provider if you have any abdominal pain. Follow these instructions at home:  Do not have sex or put anything in your vagina until your pain goes away completely.  Get plenty of rest until your pain improves.  Drink enough fluid to keep your urine pale yellow.  Take over-the-counter and prescription medicines only as told by your health care provider.  Keep all follow-up visits as told by your health care provider. This is important. Contact a health care provider if:  Your pain continues or gets worse after resting.  You have lower abdominal pain that: ? Comes and goes at regular intervals. ? Spreads to your back. ? Is similar to menstrual cramps.  You have pain or burning when you urinate. Get help right away if:  You have a fever or chills.  You have vaginal bleeding.  You are leaking fluid from your vagina.  You are passing tissue from your vagina.  You have vomiting or diarrhea that lasts for more than 24 hours.  Your baby is moving less than usual.  You feel very weak or faint.  You have shortness of breath.  You develop severe pain in your upper abdomen. Summary  Abdominal pain is common during pregnancy, and has many possible causes.  If you experience abdominal pain during pregnancy, tell your health care provider right away.  Follow your health care provider's home care instructions and keep all follow-up visits as directed. This information is not intended to replace advice given to you by your health care provider. Make sure you discuss any questions you have with your health care  provider. Document Released: 04/06/2005 Document Revised: 07/25/2018 Document Reviewed: 07/09/2016 Elsevier Patient Education  2020 [employer].    Fetal Movement Counts Patient Name: ________________________________________________ Patient Due Date: ____________________ What is a fetal movement count?  A fetal movement count is the number of times that you feel your baby move during a certain amount of time. This may also be called a fetal kick count. A fetal movement count is recommended for every pregnant woman. You may be asked to start counting fetal movements as early as week 28 of your pregnancy. Pay attention to when your baby is most active. You may notice your baby's sleep and wake cycles. You may also notice things that make your baby move more. You should do a fetal movement count:  When your baby is normally most active.  At the same time each day. A good time to count movements is while you are resting, after having something to eat and drink. How do I count fetal movements? 1. Find a quiet, comfortable area. Sit, or lie down on your side. 2. Write down the date, the start time and stop time, and the number of movements that you felt between those two times. Take this information with you to your health care visits. 3. For 2 hours, count kicks, flutters, swishes, rolls, and jabs. You should feel at least 10 movements during 2 hours. 4. You may stop counting after you have felt 10 movements. 5. If you do not feel 10 movements in 2 hours, have something to eat and drink.  Then, keep resting and counting for 1 hour. If you feel at least 4 movements during that hour, you may stop counting. Contact a health care provider if:  You feel fewer than 4 movements in 2 hours.  Your baby is not moving like he or she usually does. Date: ____________ Start time: ____________ Stop time: ____________ Movements: ____________ Date: ____________ Start time: ____________ Stop time:  ____________ Movements: ____________ Date: ____________ Start time: ____________ Stop time: ____________ Movements: ____________ Date: ____________ Start time: ____________ Stop time: ____________ Movements: ____________ Date: ____________ Start time: ____________ Stop time: ____________ Movements: ____________ Date: ____________ Start time: ____________ Stop time: ____________ Movements: ____________ Date: ____________ Start time: ____________ Stop time: ____________ Movements: ____________ Date: ____________ Start time: ____________ Stop time: ____________ Movements: ____________ Date: ____________ Start time: ____________ Stop time: ____________ Movements: ____________ This information is not intended to replace advice given to you by your health care provider. Make sure you discuss any questions you have with your health care provider. Document Released: 05/06/2006 Document Revised: 04/26/2018 Document Reviewed: 05/16/2015 Elsevier Patient Education  2020 [employer].

## 2018-12-06 NOTE — Addendum Note (Signed)
Addended by: Myles Gip on: 12/06/2018 11:43 AM   Modules accepted: Orders

## 2018-12-07 ENCOUNTER — Encounter: Payer: Self-pay | Admitting: Family Medicine

## 2018-12-13 ENCOUNTER — Other Ambulatory Visit: Payer: Self-pay

## 2018-12-13 ENCOUNTER — Ambulatory Visit (HOSPITAL_COMMUNITY): Payer: Medicaid Other | Admitting: *Deleted

## 2018-12-13 ENCOUNTER — Other Ambulatory Visit (HOSPITAL_COMMUNITY): Payer: Self-pay | Admitting: *Deleted

## 2018-12-13 ENCOUNTER — Ambulatory Visit (HOSPITAL_COMMUNITY)
Admission: RE | Admit: 2018-12-13 | Discharge: 2018-12-13 | Disposition: A | Discharge status: Home / Self Care | Payer: Medicaid Other | Source: Ambulatory Visit | Attending: Obstetrics and Gynecology | Admitting: Obstetrics and Gynecology

## 2018-12-13 ENCOUNTER — Encounter (HOSPITAL_COMMUNITY): Payer: Self-pay

## 2018-12-13 VITALS — BP 110/68 | HR 93 | Temp 98.6°F

## 2018-12-13 DIAGNOSIS — O0990 Supervision of high risk pregnancy, unspecified, unspecified trimester: Secondary | ICD-10-CM | POA: Insufficient documentation

## 2018-12-13 DIAGNOSIS — Z3A30 30 weeks gestation of pregnancy: Secondary | ICD-10-CM

## 2018-12-13 DIAGNOSIS — O2693 Pregnancy related conditions, unspecified, third trimester: Secondary | ICD-10-CM

## 2018-12-13 DIAGNOSIS — Z362 Encounter for other antenatal screening follow-up: Secondary | ICD-10-CM | POA: Insufficient documentation

## 2018-12-19 ENCOUNTER — Ambulatory Visit (INDEPENDENT_AMBULATORY_CARE_PROVIDER_SITE_OTHER): Payer: Medicaid Other | Admitting: Family Medicine

## 2018-12-19 ENCOUNTER — Other Ambulatory Visit: Payer: Self-pay

## 2018-12-19 ENCOUNTER — Encounter: Payer: Self-pay | Admitting: Family Medicine

## 2018-12-19 VITALS — BP 118/70 | HR 100 | Wt 173.0 lb

## 2018-12-19 DIAGNOSIS — Z3480 Encounter for supervision of other normal pregnancy, unspecified trimester: Secondary | ICD-10-CM | POA: Diagnosis not present

## 2018-12-19 DIAGNOSIS — Z23 Encounter for immunization: Secondary | ICD-10-CM

## 2018-12-19 MED ORDER — PRENATAL MULTIVITAMIN CH: 1.0000 | ORAL_TABLET | Freq: Every day | ORAL | 2 refills | Status: DC

## 2018-12-19 NOTE — Progress Notes (Signed)
Cassandra [name] is a 28 y.o. G3P2002 at [redacted]w[redacted]d here for routine follow up.  She reports feeling baby move, no bleeding or LOF.  See flow sheet for details.  Seen in the MAU 8/17 for abdominal pain and vomiting thought to be due to ovarian mass.   Took tylenol prior to arrival.Transabdominal ultrasound performed but unable to visualize R ovary. Received oxycodone prescription, hasn't had to take. Was told she was having contractions while in the MAU likely 2/2 pain. Has not had pain like that since.  A/P: Pregnancy at [redacted]w[redacted]d.  Doing well.   Pregnancy issues include: -Ovarian dermoid mass -had follow-up ultrasound with MFM 8/25, dimensions unchanged (7.2 x 6.2 x 7.3 cm, Doppler flow negative).  Has appointment 01/24/19 for follow-up ultrasound.  Plan remains to remove mass via surgery 6-12 weeks postpartum unless mass enlarges >10 cm. -Anemia in pregnancy -hemoglobin 10.1, started on iron supplementation at previous visit. Reports compliance and no issues taking. Drinking apple juice prn for constipation.  Infant feeding choice: Breast Contraception choice: POPs Infant circumcision desired: yes, outpatient  Tdap and flu vaccines were given today.   Preterm labor and fetal movement precautions reviewed. Safe sleep discussed. Follow up 2 weeks.

## 2018-12-19 NOTE — Patient Instructions (Addendum)
It was great to see you!  Our plans for today:  - You received your flu and Tdap vaccines today. - If you start to have significant pain, pressure or contractions again, go to the [facility]. - Come back in 2 weeks.   Take care and seek immediate care sooner if you develop any concerns.   Dr. [facility]   Third Trimester of Pregnancy The third trimester is from week 28 through week 33 (months 7 through 9). The third trimester is a time when the unborn baby (fetus) is growing rapidly. At the end of the ninth month, the fetus is about 20 inches in length and weighs 6-10 pounds. Body changes during your third trimester Your body will continue to go through many changes during pregnancy. The changes vary from woman to woman. During the third trimester:  Your weight will continue to increase. You can expect to gain 25-35 pounds (11-16 kg) by the end of the pregnancy.  You may begin to get stretch marks on your hips, abdomen, and breasts.  You may urinate more often because the fetus is moving lower into your pelvis and pressing on your bladder.  You may develop or continue to have heartburn. This is caused by increased hormones that slow down muscles in the digestive tract.  You may develop or continue to have constipation because increased hormones slow digestion and cause the muscles that push waste through your intestines to relax.  You may develop hemorrhoids. These are swollen veins (varicose veins) in the rectum that can itch or be painful.  You may develop swollen, bulging veins (varicose veins) in your legs.  You may have increased body aches in the pelvis, back, or thighs. This is due to weight gain and increased hormones that are relaxing your joints.  You may have changes in your hair. These can include thickening of your hair, rapid growth, and changes in texture. Some women also have hair loss during or after pregnancy, or hair that feels dry or thin. Your hair  will most likely return to normal after your baby is born.  Your breasts will continue to grow and they will continue to become tender. A yellow fluid (colostrum) may leak from your breasts. This is the first milk you are producing for your baby.  Your belly button may stick out.  You may notice more swelling in your hands, face, or ankles.  You may have increased tingling or numbness in your hands, arms, and legs. The skin on your belly may also feel numb.  You may feel short of breath because of your expanding uterus.  You may have more problems sleeping. This can be caused by the size of your belly, increased need to urinate, and an increase in your body's metabolism.  You may notice the fetus "dropping," or moving lower in your abdomen (lightening).  You may have increased vaginal discharge.  You may notice your joints feel loose and you may have pain around your pelvic bone. What to expect at prenatal visits You will have prenatal exams every 2 weeks until week 36. Then you will have weekly prenatal exams. During a routine prenatal visit:  You will be weighed to make sure you and the baby are growing normally.  Your blood pressure will be taken.  Your abdomen will be measured to track your baby's growth.  The fetal heartbeat will be listened to.  Any test results from the previous visit will be discussed.  You may have  a cervical check near your due date to see if your cervix has softened or thinned (effaced).  You will be tested for Group B streptococcus. This happens between 35 and 37 weeks. Your health care provider may ask you:  What your birth plan is.  How you are feeling.  If you are feeling the baby move.  If you have had any abnormal symptoms, such as leaking fluid, bleeding, severe headaches, or abdominal cramping.  If you are using any tobacco products, including cigarettes, chewing tobacco, and electronic cigarettes.  If you have any questions. Other  tests or screenings that may be performed during your third trimester include:  Blood tests that check for low iron levels (anemia).  Fetal testing to check the health, activity level, and growth of the fetus. Testing is done if you have certain medical conditions or if there are problems during the pregnancy.  Nonstress test (NST). This test checks the health of your baby to make sure there are no signs of problems, such as the baby not getting enough oxygen. During this test, a belt is placed around your belly. The baby is made to move, and its heart rate is monitored during movement. What is false labor? False labor is a condition in which you feel small, irregular tightenings of the muscles in the womb (contractions) that usually go away with rest, changing position, or drinking water. These are called Braxton Hicks contractions. Contractions may last for hours, days, or even weeks before true labor sets in. If contractions come at regular intervals, become more frequent, increase in intensity, or become painful, you should see your health care provider. What are the signs of labor?  Abdominal cramps.  Regular contractions that start at 10 minutes apart and become stronger and more frequent with time.  Contractions that start on the top of the uterus and spread down to the lower abdomen and back.  Increased pelvic pressure and dull back pain.  A watery or bloody mucus discharge that comes from the vagina.  Leaking of amniotic fluid. This is also known as your "water breaking." It could be a slow trickle or a gush. Let your health care provider know if it has a color or strange odor. If you have any of these signs, call your health care provider right away, even if it is before your due date. Follow these instructions at home: Medicines  Follow your health care provider's instructions regarding medicine use. Specific medicines may be either safe or unsafe to take during  pregnancy.  Take a prenatal vitamin that contains at least 600 micrograms (mcg) of folic acid.  If you develop constipation, try taking a stool softener if your health care provider approves. Eating and drinking   Eat a balanced diet that includes fresh fruits and vegetables, whole grains, good sources of protein such as meat, eggs, or tofu, and low-fat dairy. Your health care provider will help you determine the amount of weight gain that is right for you.  Avoid raw meat and uncooked cheese. These carry germs that can cause birth defects in the baby.  If you have low calcium intake from food, talk to your health care provider about whether you should take a daily calcium supplement.  Eat four or five small meals rather than three large meals a day.  Limit foods that are high in fat and processed sugars, such as fried and sweet foods.  To prevent constipation: ? Drink enough fluid to keep your urine clear or  pale yellow. ? Eat foods that are high in fiber, such as fresh fruits and vegetables, whole grains, and beans. Activity  Exercise only as directed by your health care provider. Most women can continue their usual exercise routine during pregnancy. Try to exercise for 30 minutes at least 5 days a week. Stop exercising if you experience uterine contractions.  Avoid heavy lifting.  Do not exercise in extreme heat or humidity, or at high altitudes.  Wear low-heel, comfortable shoes.  Practice good posture.  You may continue to have sex unless your health care provider tells you otherwise. Relieving pain and discomfort  Take frequent breaks and rest with your legs elevated if you have leg cramps or low back pain.  Take warm sitz baths to soothe any pain or discomfort caused by hemorrhoids. Use hemorrhoid cream if your health care provider approves.  Wear a good support bra to prevent discomfort from breast tenderness.  If you develop varicose veins: ? Wear support pantyhose  or compression stockings as told by your healthcare provider. ? Elevate your feet for 15 minutes, 3-4 times a day. Prenatal care  Write down your questions. Take them to your prenatal visits.  Keep all your prenatal visits as told by your health care provider. This is important. Safety  Wear your seat belt at all times when driving.  Make a list of emergency phone numbers, including numbers for family, friends, the hospital, and police and fire departments. General instructions  Avoid cat litter boxes and soil used by cats. These carry germs that can cause birth defects in the baby. If you have a cat, ask someone to clean the litter box for you.  Do not travel far distances unless it is absolutely necessary and only with the approval of your health care provider.  Do not use hot tubs, steam rooms, or saunas.  Do not drink alcohol.  Do not use any products that contain nicotine or tobacco, such as cigarettes and e-cigarettes. If you need help quitting, ask your health care provider.  Do not use any medicinal herbs or unprescribed drugs. These chemicals affect the formation and growth of the baby.  Do not douche or use tampons or scented sanitary pads.  Do not cross your legs for long periods of time.  To prepare for the arrival of your baby: ? Take prenatal classes to understand, practice, and ask questions about labor and delivery. ? Make a trial run to the hospital. ? Visit the hospital and tour the maternity area. ? Arrange for maternity or paternity leave through employers. ? Arrange for family and friends to take care of pets while you are in the hospital. ? Purchase a rear-facing car seat and make sure you know how to install it in your car. ? Pack your hospital bag. ? Prepare the baby's nursery. Make sure to remove all pillows and stuffed animals from the baby's crib to prevent suffocation.  Visit your dentist if you have not gone during your pregnancy. Use a soft  toothbrush to brush your teeth and be gentle when you floss. Contact a health care provider if:  You are unsure if you are in labor or if your water has broken.  You become dizzy.  You have mild pelvic cramps, pelvic pressure, or nagging pain in your abdominal area.  You have lower back pain.  You have persistent nausea, vomiting, or diarrhea.  You have an unusual or bad smelling vaginal discharge.  You have pain when you urinate. Get  help right away if:  Your water breaks before 37 weeks.  You have regular contractions less than 5 minutes apart before 37 weeks.  You have a fever.  You are leaking fluid from your vagina.  You have spotting or bleeding from your vagina.  You have severe abdominal pain or cramping.  You have rapid weight loss or weight gain.  You have shortness of breath with chest pain.  You notice sudden or extreme swelling of your face, hands, ankles, feet, or legs.  Your baby makes fewer than 10 movements in 2 hours.  You have severe headaches that do not go away when you take medicine.  You have vision changes. Summary  The third trimester is from week 28 through week 40, months 7 through 9. The third trimester is a time when the unborn baby (fetus) is growing rapidly.  During the third trimester, your discomfort may increase as you and your baby continue to gain weight. You may have abdominal, leg, and back pain, sleeping problems, and an increased need to urinate.  During the third trimester your breasts will keep growing and they will continue to become tender. A yellow fluid (colostrum) may leak from your breasts. This is the first milk you are producing for your baby.  False labor is a condition in which you feel small, irregular tightenings of the muscles in the womb (contractions) that eventually go away. These are called Braxton Hicks contractions. Contractions may last for hours, days, or even weeks before true labor sets in.  Signs of  labor can include: abdominal cramps; regular contractions that start at 10 minutes apart and become stronger and more frequent with time; watery or bloody mucus discharge that comes from the vagina; increased pelvic pressure and dull back pain; and leaking of amniotic fluid. This information is not intended to replace advice given to you by your health care provider. Make sure you discuss any questions you have with your health care provider. Document Released: 03/31/2001 Document Revised: 07/28/2018 Document Reviewed: 05/12/2016 Elsevier Patient Education  2020 [employer].

## 2019-01-02 ENCOUNTER — Ambulatory Visit (INDEPENDENT_AMBULATORY_CARE_PROVIDER_SITE_OTHER): Payer: Medicaid Other | Admitting: Family Medicine

## 2019-01-02 ENCOUNTER — Other Ambulatory Visit: Payer: Self-pay

## 2019-01-02 ENCOUNTER — Encounter: Payer: Self-pay | Admitting: Family Medicine

## 2019-01-02 VITALS — BP 90/68 | HR 90 | Wt 177.6 lb

## 2019-01-02 DIAGNOSIS — R2241 Localized swelling, mass and lump, right lower limb: Secondary | ICD-10-CM

## 2019-01-02 DIAGNOSIS — Z3480 Encounter for supervision of other normal pregnancy, unspecified trimester: Secondary | ICD-10-CM

## 2019-01-02 NOTE — Assessment & Plan Note (Signed)
Unilateral nonpitting LE swelling x 2 weeks that does not improve with elevation. Negative Homans sign and no respiratory symptoms and vitals stable. Given hypercoagulable state, will rule out DVT.  - RLE doppler ordered and scheduled

## 2019-01-02 NOTE — Patient Instructions (Addendum)
Although swelling can be normal in pregnancy, I am concerned about the swelling in your right leg. Due to this I have referred you to have imaging of your right leg. Please be sure to attend this appointment.  Please continue to take your prenatal vitamin and iron supplement.  We will see you back on 01/16/19 at 9:30am for your next OB visit. Please return sooner if you have any concerns.  Take care, Dr. Tarry Kos

## 2019-01-02 NOTE — Progress Notes (Signed)
     Subjective:    Patient ID: Cassandra [name]    DOB: 06/14/1980, 28 y.o. female   MRN: [id number]   Cassandra [name] is a 28 y.o. G3P2002 at [redacted]w[redacted]d being seen today for ongoing prenatal care.  She reports no concerns today. Fetal movement: normal. Denies contractions, loss of fluid, or bleeding.    Weight today: 177.6lbs, total weight gain 34lbs this pregnancy  Right LE swelling x 2 weeks that does not improve with elevation. Denies any pain except for occasional cramps that she experiences bilaterally. Denies any chest pain or shortness of breath. Vitals today P 90, BP 90/68, O2 98%.    See flow sheet for details.   Review of Systems:  Per HPI.    [place], medications and smoking status reviewed.   Objective:    Vitals:   01/02/19 1627  BP: 90/68  Pulse: 90  Weight: 177 lb 9.6 oz (80.6 kg)    Fetal Status: Fetal Heart Rate (bpm): 124 Fundal Height: 34 cm Movement: Present     General:  Alert, oriented and cooperative. Patient is in no acute distress.  Skin: Skin is warm and dry. No rash noted.   Cardiovascular: Normal heart rate noted  Respiratory: Normal respiratory effort, no problems with respiration noted  Abdomen: Soft, gravid, appropriate for gestational age.  Pain/Pressure: Absent     Pelvic: Cervical exam deferred        Extremities: Normal range of motion.  Edema: Other (Comment) - right LE swelling to ankle, nonpitting, homans signs negative  Mental Status: Normal mood and affect. Normal behavior. Normal judgment and thought content.     Assessment & Plan:  A/P: Pregnancy at [redacted]w[redacted]d.  Doing well.   Pregnancy issues include:  Ovarian dermoid mass:   Follow-up ultrasound with MFM 8/25 - dimensions unchanged (7.2 x 6.2 x 7.3 cm, Doppler flow negative).  - Has appointment 01/24/19 for follow-up ultrasound - Plan for removal of mass via surgery 6-12 weeks postpartum unless mass enlarges >10 cm  Anemia in pregnancy  Last Hgb 10.1. Endorses compliance with iron  supplement without any issues. Drinking apple juice prn for constipation.  Right lower extremity swelling Unilateral nonpitting LE swelling x 2 weeks that does not improve with elevation. Negative Homans sign and no respiratory symptoms and vitals stable. Given hypercoagulable state, will rule out DVT.  - RLE doppler ordered and scheduled   Infant feeding choice: Breast Contraception choice: POPs Infant circumcision desired: yes - outpatient  Tdap was given on 12/19/18. Flu vaccine on 12/19/18  Preterm labor and fetal movement precautions reviewed. Safe sleep discussed. Patient to continue prenatal vitamins Follow up 2 weeks with Dr. Ky Barban. No OB clinic appointment available, thus patient will need to schedule an OB clinic appointment in the coming weeks.    Mina Marble, DO [facility], PGY2 01/02/2019

## 2019-01-02 NOTE — Progress Notes (Deleted)
Right lower extremity swelling, nonpitting

## 2019-01-06 ENCOUNTER — Ambulatory Visit (HOSPITAL_COMMUNITY)
Admission: RE | Admit: 2019-01-06 | Discharge: 2019-01-06 | Disposition: A | Discharge status: Home / Self Care | Payer: Medicaid Other | Source: Ambulatory Visit | Attending: Cardiology | Admitting: Cardiology

## 2019-01-06 ENCOUNTER — Other Ambulatory Visit: Payer: Self-pay

## 2019-01-06 DIAGNOSIS — R2241 Localized swelling, mass and lump, right lower limb: Secondary | ICD-10-CM | POA: Diagnosis not present

## 2019-01-10 NOTE — Progress Notes (Signed)
Discussed negative RLE Doppler results with patient. Discussed causes of her RLE swelling likely due to IVC compression from baby. Recommended laying on left side to help take pressure off IVC and frequent leg elevation. Patient endorses more pressure in her bottom and intermittent mild contractions. Denies any leakage of fluid or blood. Reassured her that this is normal as her body progresses to the day of delivery. Return precautions discussed including leaking of fluid/blood or more frequent regular strong contractions. She understood and agreed to plan. NO further questions at this time. Patient has next OB scheduled for 9/28 with her PCP

## 2019-01-16 ENCOUNTER — Ambulatory Visit (INDEPENDENT_AMBULATORY_CARE_PROVIDER_SITE_OTHER): Payer: Medicaid Other | Admitting: Family Medicine

## 2019-01-16 ENCOUNTER — Other Ambulatory Visit: Payer: Self-pay

## 2019-01-16 VITALS — BP 100/60 | HR 98 | Wt 177.1 lb

## 2019-01-16 DIAGNOSIS — Z3480 Encounter for supervision of other normal pregnancy, unspecified trimester: Secondary | ICD-10-CM

## 2019-01-16 DIAGNOSIS — R2241 Localized swelling, mass and lump, right lower limb: Secondary | ICD-10-CM

## 2019-01-16 DIAGNOSIS — N9489 Other specified conditions associated with female genital organs and menstrual cycle: Secondary | ICD-10-CM

## 2019-01-16 NOTE — Progress Notes (Signed)
Cassandra [name] is a 28 y.o. G3P2002 at [redacted]w[redacted]d here for routine follow up.  She reports feeling baby move, no LOF, no bleeding, some pressure, no contractions.  Reports improvement in RLE swelling since sleeping on left side but still has some swelling without redness, warmth, pain. See flow sheet for details.  PE: Gen: Pleasant, NAD Ext: Symmetric calves bilaterally without redness, swelling, warmth.  Negative Homans sign.  A/P: Pregnancy at [redacted]w[redacted]d.  Doing well.   Pregnancy issues include: - R benign ovarian mass - has f/u US with MFM 01/24/19. Plan for removal of mass via surgery 6-12 weeks postpartum unless mass enlarges >10 cm - Anemia in pregnancy - on iron supplementation.  Drinks apple juice as needed for constipation. - RLE swelling - negative LE doppler 9/18, thought to be due to IVC compression.  Counseled on safe sleep.  Infant feeding choice: breast Contraception choice: POPs Infant circumcision desired: yes, outpt Is not interested in water birth.  Prefers epidural for pain control.  Tdap and flu vaccines previously given. GBS and gc/chlamydia testing was not performed today, will obtain at follow-up.  Preterm labor and fetal movement precautions reviewed. Safe sleep discussed. Follow up 2 week(s) for faculty OB clinic appointment.

## 2019-01-16 NOTE — Patient Instructions (Signed)
It was great to see you!  Our plans for today:  - Come back on 10/9 for your faculty OB clinic appointment.  You will likely get your testing for GBS done at this appointment. -Continue your iron and prenatal vitamins. -If you notice your leg becomes more swollen, hot, or red, let us know. -Be sure to sleep on your left side.  Take care and seek immediate care sooner if you develop any concerns.   Dr. [facility]   Third Trimester of Pregnancy  The third trimester is from week 28 through week 74 (months 7 through 9). This trimester is when your unborn baby (fetus) is growing very fast. At the end of the ninth month, the unborn baby is about 20 inches in length. It weighs about 6-10 pounds. Follow these instructions at home: Medicines  Take over-the-counter and prescription medicines only as told by your doctor. Some medicines are safe and some medicines are not safe during pregnancy.  Take a prenatal vitamin that contains at least 600 micrograms (mcg) of folic acid.  If you have trouble pooping (constipation), take medicine that will make your stool soft (stool softener) if your doctor approves. Eating and drinking   Eat regular, healthy meals.  Avoid raw meat and uncooked cheese.  If you get low calcium from the food you eat, talk to your doctor about taking a daily calcium supplement.  Eat four or five small meals rather than three large meals a day.  Avoid foods that are high in fat and sugars, such as fried and sweet foods.  To prevent constipation: ? Eat foods that are high in fiber, like fresh fruits and vegetables, whole grains, and beans. ? Drink enough fluids to keep your pee (urine) clear or pale yellow. Activity  Exercise only as told by your doctor. Stop exercising if you start to have cramps.  Avoid heavy lifting, wear low heels, and sit up straight.  Do not exercise if it is too hot, too humid, or if you are in a place of great height  (high altitude).  You may continue to have sex unless your doctor tells you not to. Relieving pain and discomfort  Wear a good support bra if your breasts are tender.  Take frequent breaks and rest with your legs raised if you have leg cramps or low back pain.  Take warm water baths (sitz baths) to soothe pain or discomfort caused by hemorrhoids. Use hemorrhoid cream if your doctor approves.  If you develop puffy, bulging veins (varicose veins) in your legs: ? Wear support hose or compression stockings as told by your doctor. ? Raise (elevate) your feet for 15 minutes, 3-4 times a day. ? Limit salt in your food. Safety  Wear your seat belt when driving.  Make a list of emergency phone numbers, including numbers for family, friends, the hospital, and police and fire departments. Preparing for your baby's arrival To prepare for the arrival of your baby:  Take prenatal classes.  Practice driving to the hospital.  Visit the hospital and tour the maternity area.  Talk to your work about taking leave once the baby comes.  Pack your hospital bag.  Prepare the baby's room.  Go to your doctor visits.  Buy a rear-facing car seat. Learn how to install it in your car. General instructions  Do not use hot tubs, steam rooms, or saunas.  Do not use any products that contain nicotine or tobacco, such as cigarettes and e-cigarettes. If  you need help quitting, ask your doctor.  Do not drink alcohol.  Do not douche or use tampons or scented sanitary pads.  Do not cross your legs for long periods of time.  Do not travel for long distances unless you must. Only do so if your doctor says it is okay.  Visit your dentist if you have not gone during your pregnancy. Use a soft toothbrush to brush your teeth. Be gentle when you floss.  Avoid cat litter boxes and soil used by cats. These carry germs that can cause birth defects in the baby and can cause a loss of your baby (miscarriage) or  stillbirth.  Keep all your prenatal visits as told by your doctor. This is important. Contact a doctor if:  You are not sure if you are in labor or if your water has broken.  You are dizzy.  You have mild cramps or pressure in your lower belly.  You have a nagging pain in your belly area.  You continue to feel sick to your stomach, you throw up, or you have watery poop.  You have bad smelling fluid coming from your vagina.  You have pain when you pee. Get help right away if:  You have a fever.  You are leaking fluid from your vagina.  You are spotting or bleeding from your vagina.  You have severe belly cramps or pain.  You lose or gain weight quickly.  You have trouble catching your breath and have chest pain.  You notice sudden or extreme puffiness (swelling) of your face, hands, ankles, feet, or legs.  You have not felt the baby move in over an hour.  You have severe headaches that do not go away with medicine.  You have trouble seeing.  You are leaking, or you are having a gush of fluid, from your vagina before you are 37 weeks.  You have regular belly spasms (contractions) before you are 37 weeks. Summary  The third trimester is from week 28 through week 40 (months 7 through 9). This time is when your unborn baby is growing very fast.  Follow your doctor's advice about medicine, food, and activity.  Get ready for the arrival of your baby by taking prenatal classes, getting all the baby items ready, preparing the baby's room, and visiting your doctor to be checked.  Get help right away if you are bleeding from your vagina, or you have chest pain and trouble catching your breath, or if you have not felt your baby move in over an hour. This information is not intended to replace advice given to you by your health care provider. Make sure you discuss any questions you have with your health care provider. Document Released: 07/01/2009 Document Revised: 07/28/2018  Document Reviewed: 05/12/2016 Elsevier Patient Education  2020 [employer].

## 2019-01-24 ENCOUNTER — Other Ambulatory Visit: Payer: Self-pay

## 2019-01-24 ENCOUNTER — Encounter (HOSPITAL_COMMUNITY): Payer: Self-pay

## 2019-01-24 ENCOUNTER — Ambulatory Visit (HOSPITAL_COMMUNITY): Payer: Medicaid Other | Admitting: *Deleted

## 2019-01-24 ENCOUNTER — Ambulatory Visit (HOSPITAL_COMMUNITY)
Admission: RE | Admit: 2019-01-24 | Discharge: 2019-01-24 | Disposition: A | Discharge status: Home / Self Care | Payer: Medicaid Other | Source: Ambulatory Visit | Attending: Obstetrics and Gynecology | Admitting: Obstetrics and Gynecology

## 2019-01-24 VITALS — BP 109/63 | HR 83 | Temp 98.6°F

## 2019-01-24 DIAGNOSIS — O0990 Supervision of high risk pregnancy, unspecified, unspecified trimester: Secondary | ICD-10-CM

## 2019-01-24 DIAGNOSIS — Z3A36 36 weeks gestation of pregnancy: Secondary | ICD-10-CM | POA: Diagnosis not present

## 2019-01-24 DIAGNOSIS — Z362 Encounter for other antenatal screening follow-up: Secondary | ICD-10-CM | POA: Diagnosis not present

## 2019-01-24 DIAGNOSIS — D279 Benign neoplasm of unspecified ovary: Secondary | ICD-10-CM

## 2019-01-24 DIAGNOSIS — O3483 Maternal care for other abnormalities of pelvic organs, third trimester: Secondary | ICD-10-CM

## 2019-01-27 ENCOUNTER — Other Ambulatory Visit (HOSPITAL_COMMUNITY)
Admission: RE | Admit: 2019-01-27 | Discharge: 2019-01-27 | Disposition: A | Discharge status: Home / Self Care | Payer: Medicaid Other | Source: Ambulatory Visit | Attending: Family Medicine | Admitting: Family Medicine

## 2019-01-27 ENCOUNTER — Other Ambulatory Visit: Payer: Self-pay

## 2019-01-27 ENCOUNTER — Ambulatory Visit (INDEPENDENT_AMBULATORY_CARE_PROVIDER_SITE_OTHER): Payer: Medicaid Other | Admitting: Family Medicine

## 2019-01-27 ENCOUNTER — Encounter: Payer: Self-pay | Admitting: Family Medicine

## 2019-01-27 VITALS — BP 114/62 | HR 89 | Wt 179.2 lb

## 2019-01-27 DIAGNOSIS — Z3480 Encounter for supervision of other normal pregnancy, unspecified trimester: Secondary | ICD-10-CM | POA: Insufficient documentation

## 2019-01-27 NOTE — Patient Instructions (Addendum)
It was wonderful to see you today.  Thank you for choosing [facility].   Please call [phone] with any questions about today's appointment.  Please be sure to schedule follow up at the front  desk before you leave today.   Dorris Singh, MD  Family Medicine   Schedule follow up in 1 week with Dr. Ky Barban   Before [facility] Once your baby is home with you, things may become a bit hectic as you map out a schedule around your newborn's patterns. Preparing the things you need at home before that time comes is important. Before your baby arrives, make sure you:  Have all the supplies that you will need to care for your baby.  Know where to go if there is an emergency.  Discuss the baby's arrival with other family members. What supplies will I need? Having the following supplies ready before your baby arrives will help ensure that you are prepared: Large items  Crib or bassinet and mattress. Make sure to follow safe sleep recommendations to reduce the risk of sudden infant death syndrome.  Rear-facing infant car seat. Have a trained professional check to make sure that it is installed in your car correctly. Many hospitals and fire departments perform this service free of charge.  Stroller. Always make sure any products-including cribs, mattresses, bassinets, or portable cribs and play areas-are safe. Check for recalls on your specific brand and model of crib. Breastfeeding  Nursing pillow.  Milk storage containers or bags.  Nipple cream.  Nursing bra.  Breast pads.  Breast pump.  Breast shields. Feeding  Formula.  Purified bottled water.  6-8 bottles (4-5 oz bottles and 8-9 oz bottles).  6-8 bottle nipples.  Bibs and burp cloths.  Bottle brush.  Bottle sterilizer (or a pot with a lid). [place].  Mild baby soap and baby shampoo.  Soft cloth towel and washcloth.  Hooded towel. Diapering  Diapers. You may need  to use as many as 10-12 diapers each day.  Baby wipes.  Diaper cream.  Petroleum jelly.  Changing pad.  Hand sanitizer. Health and safety  Rectal thermometer.  Infant medicines.  Bulb syringe.  Baby nail clippers.  Baby monitor.  2-3 pacifiers, if desired. Sleeping  Sleep sack or swaddling blanket.  Firm mattress pad and fitted sheets for the crib or bassinet. Other supplies  Diaper bag.  Clothing, including one-piece outfits and pajamas.  Receiving blankets. Follow these instructions at home: Preparing for an emergency Prepare for an emergency by taking these steps:  Know when to seek care or call your health care provider.  Know how to get to the nearest hospital.  List the phone numbers of your baby's health care providers near your home phone and in your cell phone.  Take an infant first aid and CPR class.  Place the phone number for the poison control center on your refrigerator.  If there will be caregivers in the home, make sure your phone number, emergency contacts, and address are placed on the refrigerator in case they need to be given to emergency services. Preparing your family   Create a plan for visitors. Keep your baby away from people who have a cough, fever, or other symptoms of illness.  Prepare freezer meals ahead of time, and ask friends and family to help with meal preparation, errands, and everyday tasks.  If you have other children: ? Talk with them about the baby coming home. Ask them  how they feel about it. ? Read a book together about being a new big brother or sister. ? Find ways to let them help you prepare for the new baby. ? Have someone ready to care for them while you are in the hospital. Where to find more information  Consumer Product Safety Commission: ScanFund.tn  American Academy of Pediatrics: www.healthychildren.org  Safe Kids Worldwide: www.safekids.org Summary  Planning is important before bringing your  baby home from the hospital. You will need to have certain supplies ready before your baby arrives.  You will need to have a rear-facing infant car seat ready prior to bringing your baby home. Have a trained professional check to make sure that it is installed in your car correctly.  Always make sure any products-including cribs, mattresses, bassinets, or portable cribs and play areas-are safe. Check for recalls on your specific brand and model of crib.  Know when to seek care or call your health care provider, and know how to get to the nearest hospital. This information is not intended to replace advice given to you by your health care provider. Make sure you discuss any questions you have with your health care provider. Document Released: 03/19/2008 Document Revised: 03/19/2017 Document Reviewed: 02/24/2017 Elsevier Patient Education  2020 [employer].

## 2019-01-27 NOTE — Progress Notes (Addendum)
  Patient Name: [name] Date of Birth: 04/05/1991 Date of Visit: 01/27/19 PCP: Rory Percy, DO  Chief Complaint: prenatal care  Subjective: Cassandra [name] is a pleasant [phone] at  [redacted]w[redacted]d dated by first trimester ultrasound. She has no unusual complaints today. Reports good fetal movement. Denies contractions, loss of fluid, or bleeding.   The patient works at [employer].  She reports that at the end of the day sometimes she has some low back pain.  She feels overall well.  She is taking iron tablets as prescribed.  She denies any difficulties with constipation.  She continues to take her prenatal vitamin.   ROS: Negative as above ROS  I have reviewed the patient's medical, surgical, family, and social history as appropriate.   Pertinent PMH:  No significant medical history  Prior Obstetric History:  G1 pregnancy she required induction of labor breast-fed for 2 months Due to pregnancy was a more precipitous delivery She denies any history of group B strep infection infant, postpartum hemorrhage or other complications  Complications of Current Pregnancy: Anemia of pregnancy and she has had a ovarian cyst which has been closely monitored.  This has not grown in size she has no symptoms currently.  Vitals:   01/27/19 0956  BP: 114/62  Pulse: 89   Last Weight  Most recent update: 01/27/2019  9:56 AM   Weight  81.3 kg (179 lb 3.2 oz)           See prenatal flowsheet for vitals obtained.  Vertex on bedside ultrasound.  GBS and repeat gonorrhea chlamydia collected with chaperone.   Stepfanie was seen today for routine prenatal visit.  Diagnoses and all orders for this visit:  Supervision of other normal pregnancy, antepartum discussed weight gain and pregnancy.  Patient has gained a little over 30 pounds.  Discussed expected weekend.  1 hour glucose tolerance test was normal.  She has received her tetanus and flu shot.  Today we discussed putting her car seat  in a car, safe sleep practices, having a crib in room, packing her bags and expected procedures when she arrives on L&D given the current coronavirus pandemic. -     CBC -     Culture, beta strep (group b only) -     Cervicovaginal ancillary only  Anticipatory Guidance and Prenatal Education provided on the following topics: - Preterm labor signs -discussed reasons to go to MAU  - Reasons to present to MAU - Nutrition in pregnancy - Contraception postpartum - Breastfeed - Safe sleep for infant   Dorris Singh, MD  [facility] Medicine Teaching Service

## 2019-01-28 LAB — CBC
Hematocrit: 29.1 % — ABNORMAL LOW (ref 34.0–46.6)
Hemoglobin: 9.5 g/dL — ABNORMAL LOW (ref 11.1–15.9)
MCH: 27.3 pg (ref 26.6–33.0)
MCHC: 32.6 g/dL (ref 31.5–35.7)
MCV: 84 fL (ref 79–97)
Platelets: 239 10*3/uL (ref 150–450)
RBC: 3.48 x10E6/uL — ABNORMAL LOW (ref 3.77–5.28)
RDW: 15 % (ref 11.7–15.4)
WBC: 8.6 10*3/uL (ref 3.4–10.8)

## 2019-01-29 ENCOUNTER — Telehealth: Payer: Self-pay | Admitting: Family Medicine

## 2019-01-29 ENCOUNTER — Encounter: Payer: Self-pay | Admitting: Family Medicine

## 2019-01-29 DIAGNOSIS — O99019 Anemia complicating pregnancy, unspecified trimester: Secondary | ICD-10-CM | POA: Insufficient documentation

## 2019-01-29 MED ORDER — IRON 325 (65 FE) MG PO TABS: 1.0000 | ORAL_TABLET | Freq: Two times a day (BID) | ORAL | 0 refills | Status: DC

## 2019-01-29 MED ORDER — PRENATAL MULTIVITAMIN CH: 1.0000 | ORAL_TABLET | Freq: Every day | ORAL | 2 refills | Status: DC

## 2019-01-29 NOTE — Telephone Encounter (Signed)
Called patient with results. Normocytic anemia present. She is taking iron tabs a few times a week. Had normal electrophoresis. Recommend increasing iron to BID, discussed constipation. Repeat hemoglobin and ferritin week of October 19. All questions answered.   Dorris Singh, MD  Family Medicine Teaching Service    CBC    Component Value Date/Time   WBC 8.6 01/27/2019 1333   WBC 5.3 06/28/2018 1220   RBC 3.48 (L) 01/27/2019 1333   RBC 4.01 06/28/2018 1220   HGB 9.5 (L) 01/27/2019 1333   HCT 29.1 (L) 01/27/2019 1333   PLT 239 01/27/2019 1333   MCV 84 01/27/2019 1333   MCH 27.3 01/27/2019 1333   MCH 30.4 06/28/2018 1220   MCHC 32.6 01/27/2019 1333   MCHC 32.5 06/28/2018 1220   RDW 15.0 01/27/2019 1333   LYMPHSABS 1.7 07/26/2018 1210   MONOABS 0.4 06/28/2018 1220   EOSABS 0.0 07/26/2018 1210   BASOSABS 0.1 07/26/2018 1210

## 2019-01-31 LAB — CULTURE, BETA STREP (GROUP B ONLY): Strep Gp B Culture: NEGATIVE

## 2019-02-03 ENCOUNTER — Other Ambulatory Visit: Payer: Self-pay

## 2019-02-03 ENCOUNTER — Ambulatory Visit (INDEPENDENT_AMBULATORY_CARE_PROVIDER_SITE_OTHER): Payer: Medicaid Other | Admitting: Student in an Organized Health Care Education/Training Program

## 2019-02-03 DIAGNOSIS — Z3480 Encounter for supervision of other normal pregnancy, unspecified trimester: Secondary | ICD-10-CM

## 2019-02-03 NOTE — Progress Notes (Signed)
  Patient Name: Cassandra [name] Date of Birth: [birth year] Date of Visit: 02/03/19 PCP: Rory Percy, DO  Chief Complaint: prenatal care  Subjective: Cassandra [name] is a pleasant [phone] at [redacted]w[redacted]d dated by first trimester ultrasound. She has no unusual complaints today. Reports good fetal movement. Denies loss of fluid, or bleeding.  Has intermittent mild cramping of abdomen which are not consistent.  She feels well overall. Her boyfriend and daughter are very excited for the arrival of their new baby boy. Patient continues to work as a Scientist, water quality at [employer] and is now sitting to alleviate some pressure from her belly.  I have reviewed the patient's medical, surgical, family, and social history as appropriate.   Prior Obstetric History:  G1 pregnancy she required induction of labor breast-fed for 2 months Due to pregnancy was a more precipitous delivery She denies any history of group B strep infection infant, postpartum hemorrhage or other complications  Complications of Current Pregnancy: Anemia of pregnancy and she has had a ovarian cyst which has been closely monitored.  This has not grown in size she has no symptoms currently.  Objective:  BP one 5/70, heart rate 95, O2 sats 99%, weight 183.8 pounds   A/P:  Supervision of other normal pregnancy Pregnancy proceeding without complication Continue to take prenatal vitamin. Return for repeat visit in 1 week.   Anticipatory Guidance and Prenatal Education provided on the following topics: - Preterm labor signs  - Reasons to present to MAU - Nutrition in pregnancy - Contraception postpartum - Breastfeed - Safe sleep for infant   Doristine Mango, [place] PGY-2

## 2019-02-03 NOTE — Assessment & Plan Note (Signed)
Pregnancy proceeding without complication Continue to take prenatal vitamin. Return for repeat visit in 1 week.

## 2019-02-03 NOTE — Patient Instructions (Signed)
It was a pleasure to see you today!  To summarize our discussion for this visit: Everything looks really great with your pregnancy today.  We confirm that your baby is head down with ultrasound  We discussed the Covid protocols for delivery  Please return to our clinic in about 1 week.  Call the clinic at [phone] if your symptoms worsen or you have any concerns.   Thank you for allowing me to take part in your care,  Dr. Doristine Mango   [facility] Contractions Contractions of the uterus can occur throughout pregnancy, but they are not always a sign that you are in labor. You may have practice contractions called Braxton Hicks contractions. These false labor contractions are sometimes confused with true labor. What are [place] contractions? Braxton Hicks contractions are tightening movements that occur in the muscles of the uterus before labor. Unlike true labor contractions, these contractions do not result in opening (dilation) and thinning of the cervix. Toward the end of pregnancy (32-34 weeks), Braxton Hicks contractions can happen more often and may become stronger. These contractions are sometimes difficult to tell apart from true labor because they can be very uncomfortable. You should not feel embarrassed if you go to the hospital with false labor. Sometimes, the only way to tell if you are in true labor is for your health care provider to look for changes in the cervix. The health care provider will do a physical exam and may monitor your contractions. If you are not in true labor, the exam should show that your cervix is not dilating and your water has not broken. If there are no other health problems associated with your pregnancy, it is completely safe for you to be sent home with false labor. You may continue to have Braxton Hicks contractions until you go into true labor. How to tell the difference between true labor and false labor True labor  Contractions  last 30-70 seconds.  Contractions become very regular.  Discomfort is usually felt in the top of the uterus, and it spreads to the lower abdomen and low back.  Contractions do not go away with walking.  Contractions usually become more intense and increase in frequency.  The cervix dilates and gets thinner. False labor  Contractions are usually shorter and not as strong as true labor contractions.  Contractions are usually irregular.  Contractions are often felt in the front of the lower abdomen and in the groin.  Contractions may go away when you walk around or change positions while lying down.  Contractions get weaker and are shorter-lasting as time goes on.  The cervix usually does not dilate or become thin. Follow these instructions at home:   Take over-the-counter and prescription medicines only as told by your health care provider.  Keep up with your usual exercises and follow other instructions from your health care provider.  Eat and drink lightly if you think you are going into labor.  If Braxton Hicks contractions are making you uncomfortable: ? Change your position from lying down or resting to walking, or change from walking to resting. ? Sit and rest in a tub of warm water. ? Drink enough fluid to keep your urine pale yellow. Dehydration may cause these contractions. ? Do slow and deep breathing several times an hour.  Keep all follow-up prenatal visits as told by your health care provider. This is important. Contact a health care provider if:  You have a fever.  You have continuous pain  in your abdomen. Get help right away if:  Your contractions become stronger, more regular, and closer together.  You have fluid leaking or gushing from your vagina.  You pass blood-tinged mucus (bloody show).  You have bleeding from your vagina.  You have low back pain that you never had before.  You feel your baby's head pushing down and causing pelvic pressure.   Your baby is not moving inside you as much as it used to. Summary  Contractions that occur before labor are called Braxton Hicks contractions, false labor, or practice contractions.  Braxton Hicks contractions are usually shorter, weaker, farther apart, and less regular than true labor contractions. True labor contractions usually become progressively stronger and regular, and they become more frequent.  Manage discomfort from [facility] contractions by changing position, resting in a warm bath, drinking plenty of water, or practicing deep breathing. This information is not intended to replace advice given to you by your health care provider. Make sure you discuss any questions you have with your health care provider. Document Released: 08/20/2016 Document Revised: 03/19/2017 Document Reviewed: 08/20/2016 Elsevier Patient Education  2020 [employer].

## 2019-02-06 LAB — CERVICOVAGINAL ANCILLARY ONLY
Chlamydia: NEGATIVE
Comment: NEGATIVE
Comment: NORMAL
Neisseria Gonorrhea: NEGATIVE

## 2019-02-08 ENCOUNTER — Encounter: Payer: Self-pay | Admitting: Family Medicine

## 2019-02-10 ENCOUNTER — Ambulatory Visit (INDEPENDENT_AMBULATORY_CARE_PROVIDER_SITE_OTHER): Payer: Medicaid Other | Admitting: Student in an Organized Health Care Education/Training Program

## 2019-02-10 ENCOUNTER — Other Ambulatory Visit: Payer: Self-pay

## 2019-02-10 VITALS — BP 118/62 | HR 94 | Wt 183.0 lb

## 2019-02-10 DIAGNOSIS — Z3483 Encounter for supervision of other normal pregnancy, third trimester: Secondary | ICD-10-CM | POA: Diagnosis not present

## 2019-02-10 NOTE — Progress Notes (Signed)
  Patient Name: Cassandra [name] Date of Birth: 1991-03-26 Date of Visit: 02/10/19 PCP: Rory Percy, DO  Chief Complaint: prenatal care  Subjective: Cassandra [name] is a pleasant [phone] at [redacted]w[redacted]d dated by first trimester ultrasound. She has no unusual complaints today. Reports good fetal movement. Denies loss of fluid, or bleeding. She feels well overall.  Feeling some ligamentous strain at the end of the day.  Has tried propping up with pillows which is helped. Patient is no longer working which is also help reduce her ligamentous strain.  I have reviewed the patient's medical, surgical, family, and social history as appropriate.   Prior Obstetric History: G1 pregnancy she required induction of labor breast-fed for 2 months G2 pregnancy was a more precipitous delivery She denies any history of group B strep infection infant, postpartum hemorrhage or other complications  Complications of Current Pregnancy:Anemia of pregnancy and she has had an ovarian cyst which has been closely monitored. This has notgrown in size she has no symptoms currently.  Objective:  Blood pressure 118/62, pulse 94, weight 183 lb (83 kg), last menstrual period 05/09/2018.   A/P:  Supervision of other normal pregnancy Pregnancy proceeding without complication Continue to take prenatal vitamin. Return for repeat visit in 1 week and can discuss scheduling BPP and induction at that time.  Anticipatory Guidance and Prenatal Education provided on the following topics: - Preterm labor signs  - Reasons to present to [facility]U - Nutrition in pregnancy - Contraception postpartum - Breastfeed - Safe sleep for infant   Doristine Mango, [place] PGY-2

## 2019-02-10 NOTE — Patient Instructions (Signed)
It was a pleasure to see you today!  To summarize our discussion for this visit:  Your pregnancy is coming along great.  Please come back for a follow-up where we will discuss scheduling for a BPP and induction if you are still pregnant past your due date.   Please return to our clinic to see me in 1 week.  Call the clinic at [phone] if your symptoms worsen or you have any concerns.   Thank you for allowing me to take part in your care,  Dr. Doristine Mango   Labor Induction  Labor induction is when steps are taken to cause a pregnant woman to begin the labor process. Most women go into labor on their own between 37 weeks and 42 weeks of pregnancy. When this does not happen or when there is a medical need for labor to begin, steps may be taken to induce labor. Labor induction causes a pregnant woman's uterus to contract. It also causes the cervix to soften (ripen), open (dilate), and thin out (efface). Usually, labor is not induced before 39 weeks of pregnancy unless there is a medical reason to do so. Your health care provider will determine if labor induction is needed. Before inducing labor, your health care provider will consider a number of factors, including:  Your medical condition and your baby's.  How many weeks along you are in your pregnancy.  How mature your baby's lungs are.  The condition of your cervix.  The position of your baby.  The size of your birth canal. What are some reasons for labor induction? Labor may be induced if:  Your health or your baby's health is at risk.  Your pregnancy is overdue by 1 week or more.  Your water breaks but labor does not start on its own.  There is a low amount of amniotic fluid around your baby. You may also choose (elect) to have labor induced at a certain time. Generally, elective labor induction is done no earlier than 39 weeks of pregnancy. What methods are used for labor induction? Methods used for labor  induction include:  Prostaglandin medicine. This medicine starts contractions and causes the cervix to dilate and ripen. It can be taken by mouth (orally) or by being inserted into the vagina (suppository).  Inserting a small, thin tube (catheter) with a balloon into the vagina and then expanding the balloon with water to dilate the cervix.  Stripping the membranes. In this method, your health care provider gently separates amniotic sac tissue from the cervix. This causes the cervix to stretch, which in turn causes the release of a hormone called progesterone. The hormone causes the uterus to contract. This procedure is often done during an office visit, after which you will be sent home to wait for contractions to begin.  Breaking the water. In this method, your health care provider uses a small instrument to make a small hole in the amniotic sac. This eventually causes the amniotic sac to break. Contractions should begin after a few hours.  Medicine to trigger or strengthen contractions. This medicine is given through an IV that is inserted into a vein in your arm. Except for membrane stripping, which can be done in a clinic, labor induction is done in the hospital so that you and your baby can be carefully monitored. How long does it take for labor to be induced? The length of time it takes to induce labor depends on how ready your body is for labor. Some inductions  can take up to 2-3 days, while others may take less than a day. Induction may take longer if:  You are induced early in your pregnancy.  It is your first pregnancy.  Your cervix is not ready. What are some risks associated with labor induction? Some risks associated with labor induction include:  Changes in fetal heart rate, such as being too high, too low, or irregular (erratic).  Failed induction.  Infection in the mother or the baby.  Increased risk of having a cesarean delivery.  Fetal death.  Breaking off  (abruption) of the placenta from the uterus (rare).  Rupture of the uterus (very rare). When induction is needed for medical reasons, the benefits of induction generally outweigh the risks. What are some reasons for not inducing labor? Labor induction should not be done if:  Your baby does not tolerate contractions.  You have had previous surgeries on your uterus, such as a myomectomy, removal of fibroids, or a vertical scar from a previous cesarean delivery.  Your placenta lies very low in your uterus and blocks the opening of the cervix (placenta previa).  Your baby is not in a head-down position.  The umbilical cord drops down into the birth canal in front of the baby.  There are unusual circumstances, such as the baby being very early (premature).  You have had more than 2 previous cesarean deliveries. Summary  Labor induction is when steps are taken to cause a pregnant woman to begin the labor process.  Labor induction causes a pregnant woman's uterus to contract. It also causes the cervix to ripen, dilate, and efface.  Labor is not induced before 39 weeks of pregnancy unless there is a medical reason to do so.  When induction is needed for medical reasons, the benefits of induction generally outweigh the risks. This information is not intended to replace advice given to you by your health care provider. Make sure you discuss any questions you have with your health care provider. Document Released: 08/26/2006 Document Revised: 04/09/2017 Document Reviewed: 05/20/2016 Elsevier Patient Education  2020 [employer].

## 2019-02-17 ENCOUNTER — Other Ambulatory Visit: Payer: Self-pay

## 2019-02-17 ENCOUNTER — Ambulatory Visit (INDEPENDENT_AMBULATORY_CARE_PROVIDER_SITE_OTHER): Payer: Medicaid Other | Admitting: Student in an Organized Health Care Education/Training Program

## 2019-02-17 ENCOUNTER — Encounter: Payer: Self-pay | Admitting: Student in an Organized Health Care Education/Training Program

## 2019-02-17 VITALS — BP 110/60 | HR 81 | Wt 184.4 lb

## 2019-02-17 DIAGNOSIS — Z3483 Encounter for supervision of other normal pregnancy, third trimester: Secondary | ICD-10-CM | POA: Diagnosis not present

## 2019-02-17 NOTE — Progress Notes (Signed)
Induction form faxed to [facility] today. Salvatore Marvel, CMA

## 2019-02-17 NOTE — Progress Notes (Signed)
  Patient Name:Cassandra [name] Date of Birth:12-26-1990 Date of Visit:02/17/19 BJ:[id number], Bryson Ha, DO  Chief Complaint: prenatal care  Subjective:Cassandra K Eleazeris a pleasant [phone] at34w6ddated by first trimester ultrasound. She has no unusual complaints today. Reports good fetal movement. Denies loss of fluid, or bleeding.She feels well overall.   I have reviewed the patient's medical, surgical, family, and social history as appropriate.  Prior Obstetric History: G1 pregnancy she required induction of labor breast-fed for 2 months G2 pregnancy was a more precipitous delivery She denies any history of group B strep infection infant, postpartum hemorrhage or other complications  Complications of Current Pregnancy:Anemia of pregnancy and she has had an ovarian cyst which has been closely monitored. This has notgrown in size she has no symptoms currently.  Objective:  Blood pressure 110/60, pulse 81, weight 184 lb 6 oz (83.6 kg), last menstrual period 05/09/2018.  A/P:  Supervision of other normal pregnancy Pregnancy proceeding without complication Continue to take prenatal vitamin. Return for repeat visit in 1 week. Scheduling for BPP on Monday as office is closed. Scheduled induction for 02/25/2019.  Anticipatory Guidance and Prenatal Education provided on the following topics: - Preterm labor signs  - Reasons to present to [facility]U - Nutrition in pregnancy - Contraception postpartum - Breastfeed - Safe sleep for infant  Doristine Mango, [place] PGY-2

## 2019-02-20 ENCOUNTER — Telehealth: Payer: Self-pay

## 2019-02-20 NOTE — Addendum Note (Signed)
Addended by: Salvatore Marvel on: 02/20/2019 01:54 PM   Modules accepted: Orders

## 2019-02-20 NOTE — Telephone Encounter (Signed)
Spoke with pt. Informed her that her BPP is sch'd for 11/6 at 7:45am at [place]. [address] loctation. Pt understood. Salvatore Marvel, CMA

## 2019-02-23 ENCOUNTER — Telehealth (HOSPITAL_COMMUNITY): Payer: Self-pay

## 2019-02-23 ENCOUNTER — Other Ambulatory Visit: Payer: Self-pay

## 2019-02-23 ENCOUNTER — Other Ambulatory Visit (HOSPITAL_COMMUNITY): Payer: Self-pay | Admitting: Advanced Practice Midwife

## 2019-02-23 ENCOUNTER — Other Ambulatory Visit (HOSPITAL_COMMUNITY): Payer: Self-pay

## 2019-02-23 ENCOUNTER — Ambulatory Visit (HOSPITAL_COMMUNITY)
Admission: RE | Admit: 2019-02-23 | Discharge: 2019-02-23 | Disposition: A | Discharge status: Home / Self Care | Payer: Medicaid Other | Source: Ambulatory Visit | Attending: Obstetrics & Gynecology | Admitting: Obstetrics & Gynecology

## 2019-02-23 ENCOUNTER — Encounter (HOSPITAL_COMMUNITY): Payer: Self-pay

## 2019-02-23 DIAGNOSIS — Z20828 Contact with and (suspected) exposure to other viral communicable diseases: Secondary | ICD-10-CM | POA: Insufficient documentation

## 2019-02-23 DIAGNOSIS — Z01812 Encounter for preprocedural laboratory examination: Secondary | ICD-10-CM | POA: Diagnosis not present

## 2019-02-23 LAB — SARS CORONAVIRUS 2 (TAT 6-24 HRS): SARS Coronavirus 2: NEGATIVE

## 2019-02-23 NOTE — MAU Note (Signed)
Covid swab collected. PT tolerated well. PT asymptomatic 

## 2019-02-24 ENCOUNTER — Ambulatory Visit (HOSPITAL_BASED_OUTPATIENT_CLINIC_OR_DEPARTMENT_OTHER)
Admission: RE | Admit: 2019-02-24 | Discharge: 2019-02-24 | Disposition: A | Discharge status: Home / Self Care | Payer: Medicaid Other | Source: Ambulatory Visit | Attending: Obstetrics and Gynecology | Admitting: Obstetrics and Gynecology

## 2019-02-24 ENCOUNTER — Other Ambulatory Visit: Payer: Self-pay

## 2019-02-24 ENCOUNTER — Ambulatory Visit (INDEPENDENT_AMBULATORY_CARE_PROVIDER_SITE_OTHER): Payer: Medicaid Other | Admitting: Family Medicine

## 2019-02-24 ENCOUNTER — Other Ambulatory Visit: Payer: Self-pay | Admitting: Student in an Organized Health Care Education/Training Program

## 2019-02-24 VITALS — BP 104/64 | HR 81 | Wt 186.0 lb

## 2019-02-24 DIAGNOSIS — Z3A40 40 weeks gestation of pregnancy: Secondary | ICD-10-CM | POA: Diagnosis not present

## 2019-02-24 DIAGNOSIS — O358XX0 Maternal care for other (suspected) fetal abnormality and damage, not applicable or unspecified: Secondary | ICD-10-CM | POA: Diagnosis not present

## 2019-02-24 DIAGNOSIS — O283 Abnormal ultrasonic finding on antenatal screening of mother: Secondary | ICD-10-CM | POA: Diagnosis not present

## 2019-02-24 DIAGNOSIS — Z3483 Encounter for supervision of other normal pregnancy, third trimester: Secondary | ICD-10-CM

## 2019-02-24 DIAGNOSIS — O480 Post-term pregnancy: Secondary | ICD-10-CM

## 2019-02-24 NOTE — Patient Instructions (Addendum)
It was wonderful meeting you today!  While you do have your induction scheduled tomorrow, if you experience any leakage of fluid, vaginal bleeding, contractions that become more frequent and on a regular basis--you need to [facility]!

## 2019-02-24 NOTE — Progress Notes (Signed)
  Patient Name: Cassandra [name] Date of Birth: [birth year] Date of Visit: 02/24/19 PCP: Rory Percy, DO  Chief Complaint: prenatal care  Subjective: Cassandra [name] is a pleasant [phone] at [redacted]w[redacted]d dated by early ultrasound consistent with LMP. She has no unusual complaints today. Reports good fetal movement. Denies any contractions, loss of fluid, or bleeding.   Induction of labor scheduled for tomorrow, 11/7.  GBS negative.  Had a BPP today performed, 8/8, with cephalic presentation.  Right ovarian cyst visualized and largely unchanged from previous.  She would like to know her cervical dilation.   ROS:  Review of Systems  Constitutional: Negative for chills and fever.  Eyes: Negative for blurred vision and double vision.  Respiratory: Negative for shortness of breath.   Cardiovascular: Negative for chest pain.  Gastrointestinal: Negative for abdominal pain and heartburn.  Neurological: Negative for headaches.    I have reviewed the patient's medical, surgical, family, and social history as appropriate.   Pertinent PMH: Anemia pregnancy, adnexal mass  Prior Obstetric History: 2 previous and SVD at term, first [redacted]w[redacted]d, second at [redacted]w[redacted]d  Complications of Current Pregnancy: Anemia pregnancy, ovarian cyst (right dermoid cyst)  Vitals:   02/24/19 1104  BP: 104/64  Pulse: 81   Last Weight  Most recent update: 02/24/2019 11:04 AM   Weight  84.4 kg (186 lb)           140 lb (63.5 kg) 24.02 Filed Weights   02/24/19 1104  Weight: 186 lb (84.4 kg)  FHT: 130 bpm Fundal height: 41 cm Cervical check: Internal os 1/2 cm, posterior and thick   Cassandra [name] was seen today for routine prenatal visit, at [redacted]w[redacted]d, doing well.  Encounter for supervision of other normal pregnancy, third trimester IOL scheduled for tomorrow, 11/7.  GBS negative. Currently without any contractions, leakage of fluid.  Reassuringly had BPP 8/8 earlier today and appropriate FHT in the clinic.  -Will  need a transvaginal ultrasound 6-12 weeks postpartum for right dermoid cyst  Anticipatory Guidance and Prenatal Education provided on the following topics: -Labor signs discussed with reasons to present to the MAU - Nutrition in pregnancy - Contraception postpartum: Considering Depo injections vs pills - Breastfeed: Planning to breast-feed  -Desires circumcision, will have this done outpatient - Safe sleep for infant   Darrelyn Hillock, DO  Family Medicine PGY-2

## 2019-02-25 ENCOUNTER — Inpatient Hospital Stay (HOSPITAL_COMMUNITY): Payer: Medicaid Other | Admitting: Anesthesiology

## 2019-02-25 ENCOUNTER — Inpatient Hospital Stay (HOSPITAL_COMMUNITY): Payer: Medicaid Other

## 2019-02-25 ENCOUNTER — Inpatient Hospital Stay (HOSPITAL_COMMUNITY)
Admission: AD | Admit: 2019-02-25 | Discharge: 2019-02-26 | DRG: 807 | Disposition: A | Discharge status: Home / Self Care | Payer: Medicaid Other | Attending: Obstetrics and Gynecology | Admitting: Obstetrics and Gynecology

## 2019-02-25 ENCOUNTER — Other Ambulatory Visit: Payer: Self-pay

## 2019-02-25 ENCOUNTER — Encounter (HOSPITAL_COMMUNITY): Payer: Self-pay

## 2019-02-25 DIAGNOSIS — D270 Benign neoplasm of right ovary: Secondary | ICD-10-CM | POA: Diagnosis present

## 2019-02-25 DIAGNOSIS — Z3A41 41 weeks gestation of pregnancy: Secondary | ICD-10-CM | POA: Diagnosis not present

## 2019-02-25 DIAGNOSIS — O99892 Other specified diseases and conditions complicating childbirth: Secondary | ICD-10-CM | POA: Diagnosis present

## 2019-02-25 DIAGNOSIS — O480 Post-term pregnancy: Secondary | ICD-10-CM | POA: Diagnosis not present

## 2019-02-25 DIAGNOSIS — O99019 Anemia complicating pregnancy, unspecified trimester: Secondary | ICD-10-CM | POA: Diagnosis present

## 2019-02-25 DIAGNOSIS — N9489 Other specified conditions associated with female genital organs and menstrual cycle: Secondary | ICD-10-CM | POA: Diagnosis present

## 2019-02-25 LAB — CBC
HCT: 38.4 % (ref 36.0–46.0)
Hemoglobin: 12.1 g/dL (ref 12.0–15.0)
MCH: 29.7 pg (ref 26.0–34.0)
MCHC: 31.5 g/dL (ref 30.0–36.0)
MCV: 94.1 fL (ref 80.0–100.0)
Platelets: 221 10*3/uL (ref 150–400)
RBC: 4.08 MIL/uL (ref 3.87–5.11)
RDW: 20.4 % — ABNORMAL HIGH (ref 11.5–15.5)
WBC: 7.4 10*3/uL (ref 4.0–10.5)
nRBC: 0 % (ref 0.0–0.2)

## 2019-02-25 LAB — RPR: RPR Ser Ql: NONREACTIVE

## 2019-02-25 LAB — ABO/RH: ABO/RH(D): A POS

## 2019-02-25 LAB — TYPE AND SCREEN
ABO/RH(D): A POS
Antibody Screen: NEGATIVE

## 2019-02-25 MED ORDER — MISOPROSTOL 50MCG HALF TABLET
ORAL_TABLET | ORAL | Status: AC
  Filled 2019-02-25: qty 1

## 2019-02-25 MED ORDER — FENTANYL-BUPIVACAINE-NACL 0.5-0.125-0.9 MG/250ML-% EP SOLN
EPIDURAL | Status: AC
  Filled 2019-02-25: qty 250

## 2019-02-25 MED ORDER — IBUPROFEN 600 MG PO TABS
600.0000 mg | ORAL_TABLET | Freq: Four times a day (QID) | ORAL | Status: DC
  Administered 2019-02-25 – 2019-02-26 (×3): 600 mg via ORAL
  Filled 2019-02-25 (×3): qty 1

## 2019-02-25 MED ORDER — ACETAMINOPHEN 325 MG PO TABS: 650.0000 mg | ORAL_TABLET | ORAL | Status: DC | PRN

## 2019-02-25 MED ORDER — ONDANSETRON HCL 4 MG PO TABS: 4.0000 mg | ORAL_TABLET | ORAL | Status: DC | PRN

## 2019-02-25 MED ORDER — OXYTOCIN 40 UNITS IN NORMAL SALINE INFUSION - SIMPLE MED
2.5000 [IU]/h | INTRAVENOUS | Status: DC
  Filled 2019-02-25: qty 1000

## 2019-02-25 MED ORDER — ONDANSETRON HCL 4 MG/2ML IJ SOLN: 4.0000 mg | INTRAMUSCULAR | Status: DC | PRN

## 2019-02-25 MED ORDER — SODIUM CHLORIDE (PF) 0.9 % IJ SOLN
INTRAMUSCULAR | Status: DC | PRN
  Administered 2019-02-25: 12 mL/h via EPIDURAL

## 2019-02-25 MED ORDER — SIMETHICONE 80 MG PO CHEW: 80.0000 mg | CHEWABLE_TABLET | ORAL | Status: DC | PRN

## 2019-02-25 MED ORDER — BENZOCAINE-MENTHOL 20-0.5 % EX AERO
1.0000 "application " | INHALATION_SPRAY | CUTANEOUS | Status: DC | PRN
  Administered 2019-02-25: 1 via TOPICAL
  Filled 2019-02-25: qty 56

## 2019-02-25 MED ORDER — OXYCODONE-ACETAMINOPHEN 5-325 MG PO TABS: 2.0000 | ORAL_TABLET | ORAL | Status: DC | PRN

## 2019-02-25 MED ORDER — OXYCODONE-ACETAMINOPHEN 5-325 MG PO TABS: 1.0000 | ORAL_TABLET | ORAL | Status: DC | PRN

## 2019-02-25 MED ORDER — LIDOCAINE HCL (PF) 1 % IJ SOLN: 30.0000 mL | INTRAMUSCULAR | Status: DC | PRN

## 2019-02-25 MED ORDER — EPHEDRINE 5 MG/ML INJ: 10.0000 mg | INTRAVENOUS | Status: DC | PRN

## 2019-02-25 MED ORDER — DIPHENHYDRAMINE HCL 25 MG PO CAPS: 25.0000 mg | ORAL_CAPSULE | Freq: Four times a day (QID) | ORAL | Status: DC | PRN

## 2019-02-25 MED ORDER — LACTATED RINGERS IV SOLN: 500.0000 mL | Freq: Once | INTRAVENOUS | Status: DC

## 2019-02-25 MED ORDER — FLEET ENEMA 7-19 GM/118ML RE ENEM: 1.0000 | ENEMA | RECTAL | Status: DC | PRN

## 2019-02-25 MED ORDER — LACTATED RINGERS IV SOLN: 500.0000 mL | INTRAVENOUS | Status: DC | PRN

## 2019-02-25 MED ORDER — FENTANYL CITRATE (PF) 100 MCG/2ML IJ SOLN: 50.0000 ug | INTRAMUSCULAR | Status: DC | PRN

## 2019-02-25 MED ORDER — ZOLPIDEM TARTRATE 5 MG PO TABS: 5.0000 mg | ORAL_TABLET | Freq: Every evening | ORAL | Status: DC | PRN

## 2019-02-25 MED ORDER — PRENATAL MULTIVITAMIN CH
1.0000 | ORAL_TABLET | Freq: Every day | ORAL | Status: DC
  Administered 2019-02-26: 1 via ORAL
  Filled 2019-02-25: qty 1

## 2019-02-25 MED ORDER — COCONUT OIL OIL: 1.0000 "application " | TOPICAL_OIL | Status: DC | PRN

## 2019-02-25 MED ORDER — PHENYLEPHRINE 40 MCG/ML (10ML) SYRINGE FOR IV PUSH (FOR BLOOD PRESSURE SUPPORT)
80.0000 ug | PREFILLED_SYRINGE | INTRAVENOUS | Status: DC | PRN
  Filled 2019-02-25: qty 10

## 2019-02-25 MED ORDER — TETANUS-DIPHTH-ACELL PERTUSSIS 5-2.5-18.5 LF-MCG/0.5 IM SUSP: 0.5000 mL | Freq: Once | INTRAMUSCULAR | Status: DC

## 2019-02-25 MED ORDER — MISOPROSTOL 25 MCG QUARTER TABLET
25.0000 ug | ORAL_TABLET | ORAL | Status: DC | PRN
  Administered 2019-02-25: 08:00:00 25 ug via VAGINAL
  Filled 2019-02-25: qty 1

## 2019-02-25 MED ORDER — SENNOSIDES-DOCUSATE SODIUM 8.6-50 MG PO TABS
2.0000 | ORAL_TABLET | ORAL | Status: DC
  Administered 2019-02-25: 2 via ORAL
  Filled 2019-02-25: qty 2

## 2019-02-25 MED ORDER — WITCH HAZEL-GLYCERIN EX PADS: 1.0000 "application " | MEDICATED_PAD | CUTANEOUS | Status: DC | PRN

## 2019-02-25 MED ORDER — DIPHENHYDRAMINE HCL 50 MG/ML IJ SOLN: 12.5000 mg | INTRAMUSCULAR | Status: DC | PRN

## 2019-02-25 MED ORDER — SOD CITRATE-CITRIC ACID 500-334 MG/5ML PO SOLN: 30.0000 mL | ORAL | Status: DC | PRN

## 2019-02-25 MED ORDER — TERBUTALINE SULFATE 1 MG/ML IJ SOLN: 0.2500 mg | Freq: Once | INTRAMUSCULAR | Status: DC | PRN

## 2019-02-25 MED ORDER — FENTANYL-BUPIVACAINE-NACL 0.5-0.125-0.9 MG/250ML-% EP SOLN: 12.0000 mL/h | EPIDURAL | Status: DC | PRN

## 2019-02-25 MED ORDER — LIDOCAINE HCL (PF) 1 % IJ SOLN
INTRAMUSCULAR | Status: DC | PRN
  Administered 2019-02-25 (×2): 6 mL via EPIDURAL

## 2019-02-25 MED ORDER — MISOPROSTOL 50MCG HALF TABLET
50.0000 ug | ORAL_TABLET | Freq: Once | ORAL | Status: AC
  Administered 2019-02-25: 50 ug via ORAL

## 2019-02-25 MED ORDER — ONDANSETRON HCL 4 MG/2ML IJ SOLN: 4.0000 mg | Freq: Four times a day (QID) | INTRAMUSCULAR | Status: DC | PRN

## 2019-02-25 MED ORDER — LACTATED RINGERS IV SOLN
INTRAVENOUS | Status: DC
  Administered 2019-02-25: 13:00:00 via INTRAVENOUS

## 2019-02-25 MED ORDER — OXYTOCIN BOLUS FROM INFUSION
500.0000 mL | Freq: Once | INTRAVENOUS | Status: AC
  Administered 2019-02-25: 500 mL via INTRAVENOUS

## 2019-02-25 MED ORDER — DIBUCAINE (PERIANAL) 1 % EX OINT: 1.0000 "application " | TOPICAL_OINTMENT | CUTANEOUS | Status: DC | PRN

## 2019-02-25 MED ORDER — PHENYLEPHRINE 40 MCG/ML (10ML) SYRINGE FOR IV PUSH (FOR BLOOD PRESSURE SUPPORT): 80.0000 ug | PREFILLED_SYRINGE | INTRAVENOUS | Status: DC | PRN

## 2019-02-25 NOTE — H&P (Addendum)
OBSTETRIC ADMISSION HISTORY AND PHYSICAL  Cassandra [name] is a 28 y.o. female G45P2002 with IUP at 53w0dby early [place] for post date induction. She reports +FMs, No LOF, no VB, no blurry vision, headaches or peripheral edema, and RUQ pain.  She plans on attempting breast feeding and moving to bottle if unsuccessful. She still undecided for mode of contraception: depo injection or oral pills. She received her prenatal care at [facility]   Dating: By 7 week [place]--->  Estimated Date of Delivery: 02/18/19  Sono:    @[redacted]w[redacted]d , CWD, normal anatomy, cephalic presentation, anterior placental lie, 3089 g, 69% EFW   Prenatal History/Complications:  Past Medical History: Past Medical History:  Diagnosis Date  . No pertinent past medical history     Past Surgical History: Past Surgical History:  Procedure Laterality Date  . wisdom teeth  2009    Obstetrical History: OB History    Gravida  3   Para  2   Term  2   Preterm  0   AB  0   Living  2     SAB  0   TAB  0   Ectopic  0   Multiple  0   Live Births  2        Obstetric Comments  Induced for postdates.         Social History Social History   Socioeconomic History  . Marital status: Significant Other    Spouse name: Not on file  . Number of children: 2  . Years of education: Not on file  . Highest education level: Not on file  Occupational History  . Not on file  Social Needs  . Financial resource strain: Not hard at all  . Food insecurity    Worry: Never true    Inability: Never true  . Transportation needs    Medical: No    Non-medical: No  Tobacco Use  . Smoking status: Never Smoker  . Smokeless tobacco: Never Used  Substance and Sexual Activity  . Alcohol use: No  . Drug use: No  . Sexual activity: Yes    Birth control/protection: None    Comment: implant removed in october 2019  Lifestyle  . Physical activity    Days per week: Not on file    Minutes per session: Not on file  .  Stress: Not at all  Relationships  . Social [place] phone: Not on file    Gets together: Not on file    Attends religious service: Not on file    Active member of club or organization: Not on file    Attends meetings of clubs or organizations: Not on file    Relationship status: Not on file  Other Topics Concern  . Not on file  Social History Narrative  . Not on file    Family History: Family History  Problem Relation Age of Onset  . Asthma Mother   . Diabetes Mother   . Asthma Sister   . Asthma Brother   . Cancer Maternal Uncle   . Hypertension Paternal Grandfather     Allergies: No Known Allergies  Medications Prior to Admission  Medication Sig Dispense Refill Last Dose  . acetaminophen (TYLENOL) 500 MG tablet Take 1 tablet (500 mg total) by mouth every 8 (eight) hours as needed for mild pain or moderate pain. 30 tablet 0   . AMBULATORY NON FORMULARY MEDICATION 1 Device by Other route once  a week. Blood Pressure Cuff/ Medium Monitored Regularly at home ICD 10: O09.90 1 kit 0   . Ferrous Sulfate (IRON) 325 (65 Fe) MG TABS Take 1 tablet (325 mg total) by mouth 2 (two) times daily. 90 tablet 0   . Prenatal Vit-Fe Fumarate-FA (PRENATAL MULTIVITAMIN) TABS tablet Take 1 tablet by mouth daily. 90 tablet 2      Review of Systems   All systems reviewed and negative except as stated in HPI  Blood pressure (!) 101/51, pulse 86, temperature 98.6 F (37 C), temperature source Oral, resp. rate 16, height 5' 5"  (1.651 m), weight 88.8 kg, last menstrual period 05/09/2018. General appearance: alert, cooperative, appears stated age and no distress Lungs: clear to auscultation bilaterally Heart: regular rate and rhythm Abdomen: soft, non-tender; bowel sounds normal Pelvic:  Extremities: no sign of DVT Presentation: cephalic Fetal monitoringBaseline: 140 bpm, Variability: Good {> 6 bpm), Accelerations: Reactive and Decelerations: Absent Uterine activity every ~7  mintues Dilation: 1.5 Effacement (%): 60 Station: -3 Exam by:: A Showfety RN   Prenatal labs: ABO, Rh: --/--/PENDING (11/07 0732) Antibody: PENDING (11/07 0732) Rubella: 5.37 (04/07 1210) RPR: Non Reactive (08/17 1402)  HBsAg: Negative (04/07 1210)  HIV: Non Reactive (08/17 1402)  GBS: Negative/-- (10/09 1015)  1 hr Glucola 112  Genetic screening  declined Anatomy US at 9w3ddemonstrates normal growth, unchanged right ovarian dermoid cyst  Prenatal Transfer Tool  Maternal Diabetes: No Genetic Screening: Declined Maternal Ultrasounds/Referrals: Normal Fetal Ultrasounds or other Referrals:  None Maternal Substance Abuse:  No Significant Maternal Medications:  None Significant Maternal Lab Results: Group B Strep negative  Results for orders placed or performed during the hospital encounter of 02/25/19 (from the past 24 hour(s))  CBC   Collection Time: 02/25/19  7:32 AM  Result Value Ref Range   WBC 7.4 4.0 - 10.5 K/uL   RBC 4.08 3.87 - 5.11 MIL/uL   Hemoglobin 12.1 12.0 - 15.0 g/dL   HCT 38.4 36.0 - 46.0 %   MCV 94.1 80.0 - 100.0 fL   MCH 29.7 26.0 - 34.0 pg   MCHC 31.5 30.0 - 36.0 g/dL   RDW 20.4 (H) 11.5 - 15.5 %   Platelets 221 150 - 400 K/uL   nRBC 0.0 0.0 - 0.2 %  Type and screen   Collection Time: 02/25/19  7:32 AM  Result Value Ref Range   ABO/RH(D) PENDING    Antibody Screen PENDING    Sample Expiration      02/28/2019,2359 Performed at [facility] [address] [place] [id number]    Patient Active Problem List   Diagnosis Date Noted  . Post-dates pregnancy 02/25/2019  . Anemia in pregnancy 01/29/2019  . Localized swelling of right lower extremity 01/02/2019  . Encounter for supervision of other normal pregnancy, third trimester 07/26/2018  . Adnexal mass 07/26/2018    Assessment/Plan:  Cassandra DALPEis a 28y.o. G3P2002 at 478w0dere for post date induction.   #Labor: Anticipated uncomplicated vaginal delivery   #Pain:  desires epidural #FWB: Category I #ID:  GBS- #MOF: breastfeeding and bottle feeding #MOC: undecided; depo vs oral pills; Patient was offered education/information, but she reports that she just needs more time #Circ:  [facility]  02/25/2019, 8:52 AM  I confirm that I have verified the information documented in the medical student's note and that I have also personally reperformed the history, physical exam and  all medical decision making activities of this service and have verified that all service and findings are accurately documented in this student's note.   Plan cytotec and FB when able.   Wende Mott, [place] 02/25/2019 9:36 AM

## 2019-02-25 NOTE — Discharge Summary (Addendum)
Postpartum Discharge Summary     Patient Name: Cassandra [name] DOB: 1990-09-12 MRN: [id number]  Date of admission: 02/25/2019 Delivering Provider: Gifford Shave   Date of discharge: 02/26/2019  Admitting diagnosis: PREG Intrauterine pregnancy: [redacted]w[redacted]d    Secondary diagnosis:  Active Problems:   Post-dates pregnancy  Additional problems: n/a     Discharge diagnosis: Term Pregnancy Delivered                                                                                                Post partum procedures:None  Augmentation: Cytotec  Complications: None  Hospital course:  Induction of Labor With Vaginal Delivery   27y.o. yo G3P2002 at 42w0das admitted to the hospital 02/25/2019 for induction of labor.  Indication for induction: Postdates.  Patient had an uncomplicated labor course as follows: cytotec x2 with SROM, progressed without further augmentation to complete.  Membrane Rupture Time/Date: 2:15 PM ,02/25/2019   Intrapartum Procedures: Episiotomy:                                          Lacerations:  1st degree [2];Perineal [11];Periurethral [8]  Patient had delivery of a Viable infant.  Information for the patient's newborn:  ElCresencia, Asmus0[[id number]]Delivery Method: Vag-Spont    02/25/2019  Details of delivery can be found in separate delivery note.  Patient had a routine postpartum course. Patient is discharged home 02/26/19. Delivery time: 4:30 PM    Magnesium Sulfate received: No BMZ received: No Rhophylac:No MMR:No Transfusion:No  Physical exam  Vitals:   02/25/19 1947 02/25/19 2309 02/26/19 0306 02/26/19 1424  BP: (!) 101/48 102/63 (!) 102/57 111/67  Pulse: 67 65 67 64  Resp: 18 18 18 18   Temp: 98.3 F (36.8 C) 98 F (36.7 C) 98.4 F (36.9 C) 98.1 F (36.7 C)  TempSrc: Oral Oral Oral Oral  SpO2:    100%  Weight:      Height:       General: alert and cooperative Lochia: appropriate Uterine Fundus: firm Incision: N/A DVT  Evaluation: No evidence of DVT seen on physical exam. Labs: Lab Results  Component Value Date   WBC 11.0 (H) 02/26/2019   HGB 10.6 (L) 02/26/2019   HCT 34.5 (L) 02/26/2019   MCV 94.5 02/26/2019   PLT 182 02/26/2019   CMP Latest Ref Rng & Units 04/15/2016  Glucose 65 - 99 mg/dL 91  BUN 7 - 25 mg/dL 9  Creatinine 0.50 - 1.10 mg/dL 0.68  Sodium 135 - 146 mmol/L 139  Potassium 3.5 - 5.3 mmol/L 4.0  Chloride 98 - 110 mmol/L 105  CO2 20 - 31 mmol/L 25  Calcium 8.6 - 10.2 mg/dL 9.2  Total Protein 6.0 - 8.3 g/dL -  Total Bilirubin 0.3 - 1.2 mg/dL -  Alkaline Phos 39 - 117 U/L -  AST 0 - 37 U/L -  ALT 0 - 35 U/L -    Discharge instruction: per After Visit Summary and "Baby and  Me Booklet".  After visit meds:  Allergies as of 02/26/2019   No Known Allergies     Medication List    STOP taking these medications   AMBULATORY NON FORMULARY MEDICATION   Iron 325 (65 Fe) MG Tabs     TAKE these medications   acetaminophen 325 MG tablet Commonly known as: Tylenol Take 2 tablets (650 mg total) by mouth every 4 (four) hours as needed (for pain scale < 4). What changed:   medication strength  how much to take  when to take this  reasons to take this   ibuprofen 600 MG tablet Commonly known as: ADVIL Take 1 tablet (600 mg total) by mouth every 6 (six) hours.   prenatal multivitamin Tabs tablet Take 1 tablet by mouth daily.       Diet: routine diet  Activity: Advance as tolerated. Pelvic rest for 6 weeks.   Outpatient follow up:4 weeks Follow up Appt:No future appointments. Follow up Visit:  Please schedule this patient for Postpartum visit in: 4 weeks with the following provider: Any provider For C/S patients schedule nurse incision check in weeks 2 weeks: no Low risk pregnancy complicated by: n/a Delivery mode:  SVD Anticipated Birth Control:  POPs PP Procedures needed: u/s in 6-12 weeks  Schedule Integrated [place] visit: no   Newborn Data: Live born female   Birth Weight:  3820g APGAR: 8, 9  Newborn Delivery   Birth date/time: 02/25/2019 16:30:00 Delivery type: Vaginal, Spontaneous      Baby Feeding: Breast Disposition:home with mother  Phill Myron, D.O. [facility] Family Medicine Fellow, [facility] for [facility], [place] 02/26/2019, 6:33 PM

## 2019-02-25 NOTE — Anesthesia Procedure Notes (Signed)
Epidural Patient location during procedure: OB Start time: 02/25/2019 2:57 PM End time: 02/25/2019 3:02 PM  Staffing Anesthesiologist: Lyn Hollingshead, MD Performed: anesthesiologist and other anesthesia staff   Preanesthetic Checklist Completed: patient identified, site marked, surgical consent, pre-op evaluation, timeout performed, IV checked, risks and benefits discussed and monitors and equipment checked  Epidural Patient position: sitting Prep: site prepped and draped and DuraPrep Patient monitoring: continuous pulse ox and blood pressure Approach: midline Location: L3-L4 Injection technique: LOR air  Needle:  Needle type: Tuohy  Needle gauge: 17 G Needle length: 9 cm and 9 Needle insertion depth: 5 cm cm Catheter type: closed end flexible Catheter size: 19 Gauge Catheter at skin depth: 10 cm Test dose: negative and Other  Assessment Events: blood not aspirated, injection not painful, no injection resistance, negative IV test and no paresthesia  Additional Notes Reason for block:procedure for pain

## 2019-02-25 NOTE — Progress Notes (Signed)
LABOR PROGRESS NOTE  Cassandra [name] is a 28 y.o. G3P2002 at [redacted]w[redacted]d  admitted for IOL for postdates  Subjective: Patient is doing well.  Feeling more pressure at this time.  Objective: BP (!) 106/58   Pulse 80   Temp 98 F (36.7 C) (Oral)   Resp 16   Ht 5\' 5"  (1.651 m)   Wt 88.8 kg   LMP 05/09/2018   BMI 32.57 kg/m  or  Vitals:   02/25/19 0739 02/25/19 0831 02/25/19 0927 02/25/19 1026  BP:  (!) 101/51 111/63 (!) 106/58  Pulse:  86 77 80  Resp:  16 16 16   Temp:    98 F (36.7 C)  TempSrc:    Oral  Weight: 88.8 kg     Height: 5\' 5"  (1.651 m)        Dilation: 4 Effacement (%): 50 Cervical Position: Posterior Station: -3 Presentation: Vertex Exam by:: Deray Dawes MD FHT: baseline rate 120, moderate  varibility, + acel, NO decel Toco: 4-6 MIN  Labs: Lab Results  Component Value Date   WBC 7.4 02/25/2019   HGB 12.1 02/25/2019   HCT 38.4 02/25/2019   MCV 94.1 02/25/2019   PLT 221 02/25/2019    Patient Active Problem List   Diagnosis Date Noted  . Post-dates pregnancy 02/25/2019  . Anemia in pregnancy 01/29/2019  . Localized swelling of right lower extremity 01/02/2019  . Encounter for supervision of other normal pregnancy, third trimester 07/26/2018  . Adnexal mass 07/26/2018    Assessment / Plan: 28 y.o. G3P2002 at [redacted]w[redacted]d here for IOL for postdates.  Labor: Progressing well.  Patient was a tight 3 cm on initial evaluation.  Foley bulb was placed and after 1 contraction patient progressed to 4 and Foley bulb came out.  Patient still 50% effaced so we will give her 1 dose of Cytotec and recheck in 4 hours. Fetal Wellbeing: Category 1, reassuring Pain Control: Epidural upon request Anticipated MOD: Vaginal delivery  Gifford Shave, MD  PGY-1, [facility]  02/25/2019, 11:47 AM

## 2019-02-25 NOTE — Lactation Note (Signed)
This note was copied from a baby's chart. Lactation Consultation Note  Patient Name: Cassandra [name] [id number] Date: 02/25/2019 Reason for consult: Initial assessment;1st time breastfeeding  2237 - I conducted an initial consult with Ms. Mazey. Her lights were dim when I walked in, and she was preparing to sleep. She states that her son, Messiah, breast fed just before I entered the room. She reports that he's latching well, and she declined assistance. She states that she saw her milk earlier and knows how to hand express her breast milk.  Ms. Oshinski has not breast fed before. She attempted with her first child, and he did not latch. She did not attempt with her second child.  We discussed benefits of breast milk to mom and baby, differences between colostrum and mature milk, feeding cues, latch, and other breast feeding basics.  I recommended that she breast feed 8-12 times a day and wake to feed as needed. Ms. Pelot asked if baby needed to burp (she was attempting to burp upon entry), and I indicated that he may not always burp after feedings.  I provided our breast feeding brochure.  Maternal Data Has patient been taught Hand Expression?: Yes Does the patient have breastfeeding experience prior to this delivery?: No  Feeding Feeding Type: Breast Fed  LATCH Score Latch: Too sleepy or reluctant, no latch achieved, no sucking elicited.(spiity/ gaggy/ couple of sucks)  Audible Swallowing: None  Type of Nipple: Everted at rest and after stimulation  Comfort (Breast/Nipple): Soft / non-tender  Hold (Positioning): Full assist, staff holds infant at breast(pillows)  LATCH Score: 4  Interventions Interventions: Breast feeding basics reviewed  Lactation Tools Discussed/Used     Consult Status Consult Status: Follow-up Date: 02/26/19 Follow-up type: In-patient    Lenore Manner 02/25/2019, 10:47 PM

## 2019-02-26 LAB — CBC
HCT: 34.5 % — ABNORMAL LOW (ref 36.0–46.0)
Hemoglobin: 10.6 g/dL — ABNORMAL LOW (ref 12.0–15.0)
MCH: 29 pg (ref 26.0–34.0)
MCHC: 30.7 g/dL (ref 30.0–36.0)
MCV: 94.5 fL (ref 80.0–100.0)
Platelets: 182 10*3/uL (ref 150–400)
RBC: 3.65 MIL/uL — ABNORMAL LOW (ref 3.87–5.11)
RDW: 20.4 % — ABNORMAL HIGH (ref 11.5–15.5)
WBC: 11 10*3/uL — ABNORMAL HIGH (ref 4.0–10.5)
nRBC: 0 % (ref 0.0–0.2)

## 2019-02-26 MED ORDER — IBUPROFEN 600 MG PO TABS: 600.0000 mg | ORAL_TABLET | Freq: Four times a day (QID) | ORAL | 0 refills | Status: DC

## 2019-02-26 MED ORDER — ACETAMINOPHEN 325 MG PO TABS: 650.0000 mg | ORAL_TABLET | ORAL | Status: DC | PRN

## 2019-02-26 NOTE — Discharge Instructions (Signed)

## 2019-02-26 NOTE — Anesthesia Preprocedure Evaluation (Signed)
Anesthesia Evaluation  Patient identified by MRN, date of birth, ID band Patient awake    Reviewed: Allergy & Precautions, H&P , NPO status , Patient's Chart, lab work & pertinent test results  Airway Mallampati: I  TM Distance: >3 FB Neck ROM: full    Dental no notable dental hx. (+) Teeth Intact   Pulmonary neg pulmonary ROS,    Pulmonary exam normal breath sounds clear to auscultation       Cardiovascular negative cardio ROS Normal cardiovascular exam Rhythm:regular Rate:Normal     Neuro/Psych negative neurological ROS  negative psych ROS   GI/Hepatic negative GI ROS, Neg liver ROS,   Endo/Other  negative endocrine ROS  Renal/GU negative Renal ROS     Musculoskeletal   Abdominal (+) + obese,   Peds  Hematology negative hematology ROS (+)   Anesthesia Other Findings   Reproductive/Obstetrics (+) Pregnancy                             Anesthesia Physical Anesthesia Plan  ASA: II  Anesthesia Plan: Epidural   Post-op Pain Management:    Induction:   PONV Risk Score and Plan:   Airway Management Planned:   Additional Equipment:   Intra-op Plan:   Post-operative Plan:   Informed Consent: I have reviewed the patients History and Physical, chart, labs and discussed the procedure including the risks, benefits and alternatives for the proposed anesthesia with the patient or authorized representative who has indicated his/her understanding and acceptance.       Plan Discussed with:   Anesthesia Plan Comments:         Anesthesia Quick Evaluation

## 2019-02-26 NOTE — Plan of Care (Signed)
  Problem: Activity: Goal: Will verbalize the importance of balancing activity with adequate rest periods Outcome: Completed/Met Goal: Ability to tolerate increased activity will improve Outcome: Completed/Met   Problem: Life Cycle: Goal: Chance of risk for complications during the postpartum period will decrease Outcome: Completed/Met   Problem: Skin Integrity: Goal: Demonstration of wound healing without infection will improve Outcome: Completed/Met   Problem: Clinical Measurements: Goal: Ability to maintain clinical measurements within normal limits will improve Outcome: Completed/Met Goal: Will remain free from infection Outcome: Completed/Met Goal: Diagnostic test results will improve Outcome: Completed/Met Goal: Respiratory complications will improve Outcome: Completed/Met Goal: Cardiovascular complication will be avoided Outcome: Completed/Met   Problem: Activity: Goal: Risk for activity intolerance will decrease Outcome: Completed/Met   Problem: Nutrition: Goal: Adequate nutrition will be maintained Outcome: Completed/Met   Problem: Elimination: Goal: Will not experience complications related to urinary retention Outcome: Completed/Met   Problem: Pain Managment: Goal: General experience of comfort will improve Outcome: Completed/Met   Problem: Safety: Goal: Ability to remain free from injury will improve Outcome: Completed/Met

## 2019-02-26 NOTE — Anesthesia Postprocedure Evaluation (Signed)
Anesthesia Post Note  Patient: Cassandra [name]  Procedure(s) Performed: AN AD [place]     Patient location during evaluation: Mother Baby Anesthesia Type: Epidural Level of consciousness: awake and alert and oriented Pain management: satisfactory to patient Vital Signs Assessment: post-procedure vital signs reviewed and stable Respiratory status: spontaneous breathing and nonlabored ventilation Cardiovascular status: stable Postop Assessment: no headache, no backache, no signs of nausea or vomiting, adequate PO intake, patient able to bend at knees and able to ambulate (patient up walking) Anesthetic complications: no    Last Vitals:  Vitals:   02/25/19 2309 02/26/19 0306  BP: 102/63 (!) 102/57  Pulse: 65 67  Resp: 18 18  Temp: 36.7 C 36.9 C  SpO2:      Last Pain:  Vitals:   02/26/19 0306  TempSrc: Oral  PainSc: 5    Pain Goal:                   Willa Rough

## 2019-02-26 NOTE — Lactation Note (Signed)
This note was copied from a baby's chart. Lactation Consultation Note  Patient Name: Cassandra [name] Aery [id number] Date: 02/26/2019 Reason for consult: Follow-up assessment;Term;1st time breastfeeding  P3 mother whose infant is now 28 hours old.  Mother did not breast feed her first two children, stating, "They would not latch."  When I arrived mother had baby latched in the cradle hold on the right breast.  He had a shallow latch and audible noises heard during sucking.  Informed mother of my observation and offered to assist with a deeper latch.  Mother agreeable.  She had the baby dressed in a onesie and a sleeper.  Explained the benefits of STS and asked for permission to remove clothing.  Mother receptive.  Also suggested she position herself better in the bed but she was not interested in doing this.  She had one leg on the floor and the other leg in bed which made it difficult for me to assist and she had no back support.  However, she did not wish to change her position.  I acknowledged her request.  Discussed some basic latching concepts and assisted baby to latch deeply into breast tissue.  He began sucking rhythmically and mother stated that "if felt better."  Demonstrated breast compressions for her.  She did a return demonstration; needed a few reminders about finger placement and she wanted to continue to make an air hole for baby to "breathe."  After further education she relaxed and observed how well he was feeding.  Observed him feeding for 10 minutes prior to my departure.  Encouraged to continue feeding 8-12 times/24 hours or sooner if he shows feeding cues.  Encouraged hand expression before/after feeding to help increase milk supply.  Colostrum container given and milk storage times reviewed.  Finger feeding demonstrated.  Mother will call her RN/LC for latch assistance as needed.  Praised her for her efforts and encouraged to continue practicing.  This baby seems like he will be  quite easy to latch and feed which is mother's desire at this time.  She has a DEBP for home use.  Father present and on his phone during our discussion.     Maternal Data Formula Feeding for Exclusion: Yes Reason for exclusion: Mother's choice to formula and breast feed on admission Has patient been taught Hand Expression?: Yes Does the patient have breastfeeding experience prior to this delivery?: No  Feeding Feeding Type: Breast Fed  LATCH Score Latch: Grasps breast easily, tongue down, lips flanged, rhythmical sucking.  Audible Swallowing: A few with stimulation  Type of Nipple: Everted at rest and after stimulation  Comfort (Breast/Nipple): Soft / non-tender  Hold (Positioning): Assistance needed to correctly position infant at breast and maintain latch.  LATCH Score: 8  Interventions Interventions: Breast feeding basics reviewed;Assisted with latch;Skin to skin;Breast massage;Hand express;Breast compression  Lactation Tools Discussed/Used     Consult Status Consult Status: Follow-up Date: 02/27/19 Follow-up type: In-patient    Danell Verno R Maurion Walkowiak 02/26/2019, 11:21 AM

## 2019-02-26 NOTE — Progress Notes (Signed)
CSW introduced self and role to MOB/pt at pt's bedside along with FOB, Malik.  MOB/pt agrees to [place], [place], remaining in room during [place] visit.  CSW advised pt of reason for consult/referral- "history of depression".  Pt/MOB denies a history of depression or any mental health issues; current or past.  CSW inquired if pt has a history of PPD with her other 2 children (ages 27yo and 41yo), to which she acknowledges having  "for about a week".  CSW talked with pt and FOB about signs to watch for and in regards to steps to take if concerns arise.  Pt reports an abundance of family support; mentioning her mom and grandmother as a strong source of help now with the other 2 children while they are here.  Pt/MOB reports having all necessary supplies for baby and add baby to their MAF/Medicaid coverage.   Pt denies any current needs or concerns.  CSW will sign off and encouraged pt/MOB to ask for CSW if needs do arise.    Eduard Clos, MSW, LCSW Clinical Social Worker

## 2019-03-15 ENCOUNTER — Other Ambulatory Visit: Payer: Self-pay

## 2019-03-15 ENCOUNTER — Telehealth: Payer: Medicaid Other | Admitting: Family Medicine

## 2019-03-15 DIAGNOSIS — R899 Unspecified abnormal finding in specimens from other organs, systems and tissues: Secondary | ICD-10-CM

## 2019-03-15 NOTE — Progress Notes (Signed)
I sent patient a text link through [place].  I called her via phone and also sent her a "nudge" through [place].  I left her a message on her voicemail and she did not call me back.

## 2019-04-18 ENCOUNTER — Ambulatory Visit: Payer: Medicaid Other | Attending: Internal Medicine

## 2019-04-18 DIAGNOSIS — Z20828 Contact with and (suspected) exposure to other viral communicable diseases: Secondary | ICD-10-CM | POA: Diagnosis not present

## 2019-04-18 DIAGNOSIS — Z20822 Contact with and (suspected) exposure to covid-19: Secondary | ICD-10-CM

## 2019-04-19 ENCOUNTER — Other Ambulatory Visit: Payer: Medicaid Other

## 2019-04-19 LAB — NOVEL CORONAVIRUS, NAA: SARS-CoV-2, NAA: NOT DETECTED

## 2019-04-27 ENCOUNTER — Ambulatory Visit (INDEPENDENT_AMBULATORY_CARE_PROVIDER_SITE_OTHER): Payer: Medicaid Other | Admitting: Family Medicine

## 2019-04-27 ENCOUNTER — Encounter: Payer: Self-pay | Admitting: Family Medicine

## 2019-04-27 ENCOUNTER — Other Ambulatory Visit: Payer: Self-pay

## 2019-04-27 VITALS — BP 102/72 | HR 72 | Ht 64.0 in | Wt 167.4 lb

## 2019-04-27 DIAGNOSIS — Z789 Other specified health status: Secondary | ICD-10-CM | POA: Diagnosis not present

## 2019-04-27 DIAGNOSIS — N9489 Other specified conditions associated with female genital organs and menstrual cycle: Secondary | ICD-10-CM | POA: Diagnosis not present

## 2019-04-27 MED ORDER — MEDROXYPROGESTERONE ACETATE 150 MG/ML IM SUSP
150.0000 mg | Freq: Once | INTRAMUSCULAR | Status: AC
  Administered 2019-04-27: 10:00:00 150 mg via INTRAMUSCULAR

## 2019-04-27 NOTE — Patient Instructions (Signed)
It was great to see you!  Our plans for today:  - We will call you with the results of your ultrasound and next steps. - Come back in 3 months for your next depo shot. Make sure to wear condoms when you have sex for the next week. - We will call you when your paperwork is ready.  Take care and seek immediate care sooner if you develop any concerns.   Dr. [facility]

## 2019-04-27 NOTE — Progress Notes (Signed)
Subjective:    Cassandra [name] is a 29 y.o. G29P3003 African American female who presents for a postpartum visit. She is 8 weeks postpartum following a spontaneous vaginal delivery. I have fully reviewed the prenatal and intrapartum course. The delivery was at 70 gestational weeks. Outcome: spontaneous vaginal delivery. Anesthesia: epidural. Postpartum course has been uncomplicated. Baby's course has been uncomplicated. Baby is feeding by both breast and bottle -  . Patient has resumed period. Bowel function is normal. Bladder function is normal. Patient is not sexually active.  Planning on Depo for birth control. Postpartum depression screening: negative.  The following portions of the patient's history were reviewed and updated as appropriate: allergies, current medications, past medical history, past surgical history and problem list.  Review of Systems Pertinent items are noted in HPI.   Vitals:   04/27/19 0938  BP: 102/72  Pulse: 72  SpO2: 98%  Weight: 167 lb 6 oz (75.9 kg)  Height: 5\' 4"  (1.626 m)    Objective:     General:  alert, cooperative and no distress   Breasts:  deferred, no complaints  Lungs: clear to auscultation bilaterally  Heart:  regular rate and rhythm  Abdomen: soft, nontender, uterine fundus firm                          Assessment and plan:   Normal postpartum exam. 8 wks s/p SVD Depression screening, negative Contraception counseling, Depo shot received today. R adnexal mass - no complaints, will obtain f/u US. Plans to return to work 06/03/2019.

## 2019-05-08 ENCOUNTER — Other Ambulatory Visit: Payer: Self-pay

## 2019-05-08 ENCOUNTER — Ambulatory Visit (HOSPITAL_COMMUNITY)
Admission: RE | Admit: 2019-05-08 | Discharge: 2019-05-08 | Disposition: A | Discharge status: Home / Self Care | Payer: Medicaid Other | Source: Ambulatory Visit | Attending: Family Medicine | Admitting: Family Medicine

## 2019-05-08 DIAGNOSIS — N9489 Other specified conditions associated with female genital organs and menstrual cycle: Secondary | ICD-10-CM | POA: Diagnosis not present

## 2019-05-08 DIAGNOSIS — R1909 Other intra-abdominal and pelvic swelling, mass and lump: Secondary | ICD-10-CM | POA: Diagnosis not present

## 2019-05-08 NOTE — Addendum Note (Signed)
Addended by: Myles Gip on: 05/08/2019 05:46 PM   Modules accepted: Orders

## 2019-05-10 ENCOUNTER — Encounter: Payer: Self-pay | Admitting: Family Medicine

## 2019-06-09 ENCOUNTER — Ambulatory Visit (INDEPENDENT_AMBULATORY_CARE_PROVIDER_SITE_OTHER): Payer: Medicaid Other | Admitting: Family Medicine

## 2019-06-09 ENCOUNTER — Other Ambulatory Visit: Payer: Self-pay

## 2019-06-09 ENCOUNTER — Encounter: Payer: Self-pay | Admitting: Family Medicine

## 2019-06-09 VITALS — BP 112/75 | HR 73 | Wt 175.4 lb

## 2019-06-09 DIAGNOSIS — N9489 Other specified conditions associated with female genital organs and menstrual cycle: Secondary | ICD-10-CM | POA: Diagnosis not present

## 2019-06-09 NOTE — Progress Notes (Signed)
   GYNECOLOGY PROBLEM  VISIT ENCOUNTER NOTE  Subjective:   Cassandra [name] is a 29 y.o. G43P3003 female here for a a problem GYN visit.  Current complaints: adnexal pain, started in pregnancy, worsened throughout. After delivery continued to have pain.  Warmth helps.  Went to [facility] due continued pain and they ordered an [place] which was obtained in Jan 2021. Reports tylenol helps. Reports LMP May 21, 2019.   Name pronounced: [id number]  Denies abnormal vaginal bleeding, discharge, problems with intercourse or other gynecologic concerns.    Gynecologic History Patient's last menstrual period was 05/21/19 (approximate). Contraception: condoms  There are no preventive care reminders to display for this patient.   The following portions of the patient's history were reviewed and updated as appropriate: allergies, current medications, past family history, past medical history, past social history, past surgical history and problem list.  Review of Systems Pertinent items are noted in HPI.   Objective:  BP 112/75   Pulse 73   Wt 175 lb 6.4 oz (79.6 kg)   LMP 05/21/2019 (Approximate)   BMI 30.11 kg/m  Gen: well appearing, NAD HEENT: no scleral icterus CV: RR Lung: Normal WOB Ext: warm well perfused  PELVIC: Normal appearing external genitalia; normal appearing vaginal mucosa and cervix.  No abnormal discharge noted.  Normal uterine size. + right palpable mass. None on left. Mild right adnexal tenderness. Not uterine tenderness.   Assessment and Plan:   1. 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 Verita Schneiders  Message sent to [facility] to have patient scheduled for cyctectomy vs oophrectomy.  Counseled patient on typical surgical procedure- laproscopic and typical healing time.  Patient voiced understanding and will await call from surgical scheduling.     Please refer to After Visit Summary for other  counseling recommendations.   Return in about 4 weeks (around 07/07/2019) for Scheduled surgery.  Caren Macadam, MD, MPH, ABFM Attending [place] for [facility]

## 2019-07-05 ENCOUNTER — Other Ambulatory Visit: Payer: Self-pay

## 2019-07-05 ENCOUNTER — Telehealth (INDEPENDENT_AMBULATORY_CARE_PROVIDER_SITE_OTHER): Payer: Medicaid Other | Admitting: Obstetrics & Gynecology

## 2019-07-05 ENCOUNTER — Encounter: Payer: Self-pay | Admitting: Obstetrics & Gynecology

## 2019-07-05 DIAGNOSIS — D270 Benign neoplasm of right ovary: Secondary | ICD-10-CM

## 2019-07-05 DIAGNOSIS — Z3009 Encounter for other general counseling and advice on contraception: Secondary | ICD-10-CM

## 2019-07-05 NOTE — Patient Instructions (Addendum)
Laparoscopic Surgery - Care After Laparoscopy is a surgical procedure. It is used to diagnose and treat diseases inside the belly(abdomen). It is usually a brief, common, and relatively simple procedure. The laparoscopeis a thin, lighted, pencil-sized instrument. It is like a telescope. It is inserted into your abdomen through a small cut (incision). Your caregiver can look at the organs inside your body through this instrument.  She can see if there is anything abnormal. Laparoscopy can be done either in a hospital or outpatient clinic. You may be given a mild sedative to help you relax before the procedure. Once in the operating room, you will be given a drug to make you sleep (general anesthesia). Laparoscopy usually lasts about 1 hour. After the procedure, you will be monitored in a recovery area until you are stable and doing well. Once you are home, it may take 3 to 7 days to fully recover.   Laparoscopy has relatively few risks. Your caregiver will discuss the risks with you before the procedure. Some problems that can occur include: RISKS AND COMPLICATIONS   Allergies to medicines.  Difficulty breathing.  Bleeding.  Infection.  Damage to other surrounding structures HOME CARE INSTRUCTIONS   Infection.  Bleeding.  Damage to other organs.  Anesthetic side effects.   Need for additional procedures such as open procedures/laparotomy PROCEDURE Once you receive anesthesia, your surgeon inflates the abdomen with a harmless gas (carbon dioxide). This makes the organs easier to see. The laparoscope is inserted into the abdomen through a small incision. This allows your surgeon to see into the abdomen. Other small instruments are also inserted into the abdomen through other small openings. Many surgeons attach a video camera to the laparoscope to enlarge the view. During a laparoscopy, the surgeon may be looking for inflammation, infection, or cancer.  The surgeon may also need to take  out certain organs or take tissue samples (biopsies). The specimens are sent to a specialist in looking at cells and tissue samples (pathologist). The pathologist examines them under a microscope to help to diagnose or confirm a disease. AFTER THE PROCEDURE   The incisions are closed with stitches (sutures) and Dermabond. Because these incisions are small (usually less than 1/2 inch), there is usually minimal discomfort after the procedure. There may also be discomfort from the instrument placement incisions in the abdomen. You will be given pain medicine to ease any discomfort.  You will rest in a recovery room for 1-2 hours until you are stable and doing well.  You may have some mild discomfort in the throat. This is from the tube placed in your throat while you were sleeping.  You may experience discomfort in the shoulder area from some trapped air between the liver and diaphragm. This sensation is normal and will slowly go away on its own.  The recovery time is shortened as long as there are no complications.  You will rest in a recovery room until stable and doing well. As long as there are no complications, you may be allowed to go home. Someone will need to drive you home and be with you for at least 24 hours once home. FINDING OUT THE RESULTS You will be called with the results of the pathology and will discuss these results with  your caregiver during your postoperative appointment. Do not assume everything is normal if you have not heard from your caregiver or the medical facility. It is important for you to follow up on all of your results. HOME  CARE INSTRUCTIONS   Take all medicines as directed.  Only take over-the-counter or prescription medicines for pain, discomfort, or fever as directed by your caregiver.  Resume daily activities as directed.  Showers are preferred over baths.  You may resume sexual activities in 1 week or as directed.  Do not drive while taking  narcotics. SEEK MEDICAL CARE IF:  There is increasing abdominal pain.  You feel lightheaded or faint.  You have the chills.  You have an oral temperature above 102 F (38.9 C).  There is pus-like (purulent) drainage from any of the wounds.  You are unable to pass gas or have a bowel movement.  You feel sick to your stomach (nauseous) or throw up (vomit). MAKE SURE YOU:   Understand these instructions.  Will watch your condition.  Will get help right away if you are not doing well or get worse.  ExitCare Patient Information 2013 [place].    Laparoscopic Tubal Ligation Laparoscopic tubal ligation is a procedure to close the fallopian tubes. This is done so that you cannot get pregnant. When the fallopian tubes are closed, the eggs that your ovaries release cannot enter the uterus, and sperm cannot reach the released eggs. You should not have this procedure if you want to get pregnant someday or if you are unsure about having more children. Tell a health care provider about:  Any allergies you have.  All medicines you are taking, including vitamins, herbs, eye drops, creams, and over-the-counter medicines.  Any problems you or family members have had with anesthetic medicines.  Any blood disorders you have.  Any surgeries you have had.  Any medical conditions you have.  Whether you are pregnant or may be pregnant.  Any past pregnancies. What are the risks? Generally, this is a safe procedure. However, problems may occur, including:  Infection.  Bleeding.  Injury to other organs in the abdomen.  Side effects from anesthetic medicines.  Failure of the procedure. This procedure can increase your risk of a kind of pregnancy in which a fertilized egg attaches to the outside of the uterus (ectopic pregnancy). What happens before the procedure? Medicines  Ask your health care provider about: ? Changing or stopping your regular medicines. This is  especially important if you are taking diabetes medicines or blood thinners. ? Taking medicines such as aspirin and ibuprofen. These medicines can thin your blood. Do not take these medicines unless your health care provider tells you to take them. ? Taking over-the-counter medicines, vitamins, herbs, and supplements. Staying hydrated  Follow instructions from your health care provider about hydration, which may include: ? Up to 2 hours before the procedure - you may continue to drink clear liquids, such as water, clear fruit juice, black coffee, and plain tea. Eating and drinking  Follow instructions from your health care provider about eating and drinking, which may include: ? 8 hours before the procedure - stop eating heavy meals or foods, such as meat, fried foods, or fatty foods. ? 6 hours before the procedure - stop eating light meals or foods, such as toast or cereal. ? 6 hours before the procedure - stop drinking milk or drinks that contain milk. ? 2 hours before the procedure - stop drinking clear liquids. General instructions  Do not use any products that contain nicotine or tobacco for at least 4 weeks before the procedure. These products include cigarettes, e-cigarettes, and chewing tobacco. If you need help quitting, ask your health care provider.  Plan  to have someone take you home from the hospital.  If you will be going home right after the procedure, plan to have someone with you for 24 hours.  Ask your health care provider: ? How your surgery site will be marked. ? What steps will be taken to help prevent infection. These may include:  Removing hair at the surgery site.  Washing skin with a germ-killing soap.  Taking antibiotic medicine. What happens during the procedure?      An IV will be inserted into one of your veins.  You will be given one or more of the following: ? A medicine to help you relax (sedative). ? A medicine to numb the area (local  anesthetic). ? A medicine to make you fall asleep (general anesthetic). ? A medicine that is injected into an area of your body to numb everything below the injection site (regional anesthetic).  Your bladder may be emptied with a small tube (catheter).  If you have been given a general anesthetic, a tube will be put down your throat to help you breathe.  Two small incisions will be made in your lower abdomen and near your belly button.  Your abdomen will be inflated with a gas. This will let the surgeon see better and will give the surgeon room to work.  A thin, lighted tube (laparoscope) with a camera attached will be inserted into your abdomen through one of the incisions. Small instruments will be inserted through the other incision.  The fallopian tubes will be tied off, burned (cauterized), or blocked with a clip, ring, or clamp. A small portion in the center of each fallopian tube may be removed. The whole tubes may also be removed.  The gas will be released from the abdomen.  The incisions will be closed with stitches (sutures).  A bandage (dressing) will be placed over the incisions. The procedure may vary among health care providers and hospitals. What happens after the procedure?  Your blood pressure, heart rate, breathing rate, and blood oxygen level will be monitored until you leave the hospital.  You will be given medicine to help with pain, nausea, and vomiting as needed. Summary  Laparoscopic tubal ligation is a procedure that is done so that you cannot get pregnant.  You should not have this procedure if you want to get pregnant someday or if you are unsure about having more children.  The procedure is done using a thin, lighted tube (laparoscope) with a camera attached that will be inserted into your abdomen through an incision.  Follow instructions from your health care provider about eating and drinking before the procedure. This information is not intended to  replace advice given to you by your health care provider. Make sure you discuss any questions you have with your health care provider. Document Revised: 09/13/2018 Document Reviewed: 03/01/2018 Elsevier Patient Education  2020 [employer].

## 2019-07-05 NOTE — Progress Notes (Signed)
TELEHEALTH GYNECOLOGY VIRTUAL VIDEO VISIT ENCOUNTER NOTE  Provider location: [facility] at [place]   I connected with Cassandra [name] on 07/05/19 at 11:15 AM EDT by MyChart Video Encounter at home and verified that I am speaking with the correct person using two identifiers.   I discussed the limitations, risks, security and privacy concerns of performing an evaluation and management service virtually and the availability of in person appointments. I also discussed with the patient that there may be a patient responsible charge related to this service. The patient expressed understanding and agreed to proceed.   History:  Cassandra [name] is a 29 y.o. [phone] female being followed up today to discuss upcoming surgery for symptomatic large dermoid cyst. Considering tubal sterilization in addition to her surgery on 08/08/19.  She denies any abnormal vaginal discharge, bleeding, pelvic pain or other concerns.      Past Medical History:  Diagnosis Date  . No pertinent past medical history    Past Surgical History:  Procedure Laterality Date  . wisdom teeth  2009   The following portions of the patient's history were reviewed and updated as appropriate: allergies, current medications, past family history, past medical history, past social history, past surgical history and problem list.   Health Maintenance:  Normal pap and negative HRHPV on 07/26/2018.    Review of Systems:  Pertinent items noted in HPI and remainder of comprehensive ROS otherwise negative.  Physical Exam:   General:  Alert, oriented and cooperative. Patient appears to be in no acute distress.  Mental Status: Normal mood and affect. Normal behavior. Normal judgment and thought content.   Respiratory: Normal respiratory effort, no problems with respiration noted  Rest of physical exam deferred due to type of encounter  Labs and Imaging US Pelvic Complete With Transvaginal  Result Date:  05/08/2019 CLINICAL DATA:  Adnexal mass, follow-up EXAM: TRANSABDOMINAL AND TRANSVAGINAL ULTRASOUND OF PELVIS TECHNIQUE: Both transabdominal and transvaginal ultrasound examinations of the pelvis were performed. Transabdominal technique was performed for global imaging of the pelvis including uterus, ovaries, adnexal regions, and pelvic cul-de-sac. It was necessary to proceed with endovaginal exam following the transabdominal exam to visualize the adnexa. COMPARISON:  12/05/2018 Correlation: MR pelvis 09/02/2018 FINDINGS: Uterus Measurements: 8.5 x 4.1 x 5.8 cm = volume: 106 mL. Anteverted. No intrauterine mass identified. Endometrium Thickness: 12 mm.  No endometrial fluid or focal abnormality Right ovary Measurements: 2.8 x 1.3 x 1.5 cm = volume: 2.7 mL. Normal morphology without mass Left ovary Measurements: 2.6 x 1.1 x 1.4 cm = volume: 2.0 mL. Normal morphology without mass Other findings Trace free pelvic fluid. RIGHT adnexal mass identified 6.4 x 5.9 x 6.1 cm, heterogeneous in appearance, with areas of lower echogenicity as well as hyperechoic nonshadowing regions. Sonographic appearance is most consistent with a dermoid tumor; this corresponds to the dermoid tumor identified on the prior MRI. 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 Remaining ultrasound exam unremarkable. Electronically Signed   By: Lavonia Dana M.D.   On: 05/08/2019 12:03     Assessment and Plan:     1. Dermoid cyst of right ovary Scheduled for laparoscopic ovarian cystectomy, possible oophorectomy. The risks of surgery were discussed in detail with the patient including but not limited to: bleeding which may require transfusion or reoperation; infection which may require prolonged hospitalization or re-hospitalization and antibiotic therapy; injury  to bowel, bladder, ureters and major vessels or  other surrounding organs; need for additional procedures including laparotomy; thromboembolic phenomenon, incisional problems and other postoperative or anesthesia complications.  Patient was told that the likelihood that her condition and symptoms will be treated effectively with this surgical management was very high; the postoperative expectations were also discussed in detail.  All questions were answered.  Routine preoperative instructions will be given to her by the preoperative nursing team.   She is aware of need for preoperative COVID testing; she will be given further preoperative instructions at that [place] screening visit. Printed patient education handouts about the procedure were given to the patient to review at home.  2. Consultation for female sterilization Patient is considering laparoscopic bilateral tubal sterilization.  Other reversible forms of contraception were discussed with patient; she declines all other modalities. Discussed bilateral tubal sterilization in detail; discussed options of laparoscopic bilateral tubal sterilization using Filshie clips vs laparoscopic bilateral salpingectomy. Risks and benefits discussed in detail including but not limited to: risk of regret, permanence of method, bleeding, infection, injury to surrounding organs and need for additional procedures.  Failure risk of 1-2 % for Filshie clips and <1% for bilateral salpingectomy with increased risk of ectopic gestation if pregnancy occurs was also discussed with patient.  Also discussed possibility of post-tubal pain syndrome.  Discussed possible reduction of risk of ovarian cancer via bilateral salpingectomy given that a growing body of knowledge reveals that the majority of cases of high grade serous "ovarian" cancer actually are actually  cancers arising from the fimbriated end of the fallopian tubes. Emphasized that removal of fallopian tubes do not result in any known hormonal imbalance.  Patient is  considering this, will talk to partner.  She will come in tomorrow (07/06/19) to sign Medicaid papers; this signing needs to occur at least 30 days before procedure as per Medicaid guidelines.     I discussed the assessment and treatment plan with the patient. The patient was provided an opportunity to ask questions and all were answered. The patient agreed with the plan and demonstrated an understanding of the instructions.   The patient was advised to call back or seek an in-person evaluation/go to the ED if the symptoms worsen or if the condition fails to improve as anticipated.  I provided 25 minutes of face-to-face time during this encounter.   Verita Schneiders, MD [facility], [place]

## 2019-07-05 NOTE — Progress Notes (Signed)
I connected with  Cassandra [name] on 07/05/19 at 11:15 AM EDT by telephone and verified that I am speaking with the correct person using two identifiers.   I discussed the limitations, risks, security and privacy concerns of performing an evaluation and management service by telephone and the availability of in person appointments. I also discussed with the patient that there may be a patient responsible charge related to this service. The patient expressed understanding and agreed to proceed.  Bethanne Ginger, CMA 07/05/2019  10:48 AM

## 2019-07-06 ENCOUNTER — Ambulatory Visit: Payer: Medicaid Other

## 2019-07-26 ENCOUNTER — Other Ambulatory Visit: Payer: Self-pay

## 2019-07-26 ENCOUNTER — Ambulatory Visit (INDEPENDENT_AMBULATORY_CARE_PROVIDER_SITE_OTHER): Payer: Medicaid Other

## 2019-07-26 DIAGNOSIS — Z3042 Encounter for surveillance of injectable contraceptive: Secondary | ICD-10-CM

## 2019-07-26 MED ORDER — MEDROXYPROGESTERONE ACETATE 150 MG/ML IM SUSP
150.0000 mg | Freq: Once | INTRAMUSCULAR | Status: AC
  Administered 2019-07-26: 150 mg via INTRAMUSCULAR

## 2019-07-26 NOTE — Progress Notes (Signed)
Patient here today for Depo Provera injection and is within her dates.    Last contraceptive appt was 04/27/19  Depo given in [place] today.  Site unremarkable & patient tolerated injection.    Next injection due 10/11/19 - 10/25/2019.  Reminder card given.    Talbot Grumbling, RN

## 2019-07-31 ENCOUNTER — Encounter (HOSPITAL_BASED_OUTPATIENT_CLINIC_OR_DEPARTMENT_OTHER): Payer: Self-pay | Admitting: Obstetrics & Gynecology

## 2019-07-31 ENCOUNTER — Other Ambulatory Visit: Payer: Self-pay

## 2019-07-31 NOTE — Progress Notes (Signed)
Spoke w/ via phone for pre-op interview--- PT Lab needs dos---CBC, T&S, Urine preg             Lab results------ no COVID test ------ 08-04-2019 @ 0830 Arrive at ------- [id number] NPO after ------ MN w/ exception clear liquids until 0645 then nothing by mouth (no cream/milk products) Medications to take morning of surgery ---- NONE Diabetic medication ----- n/a Patient Special Instructions ----- n/a Pre-Op special Istructions ----- n/a Patient verbalized understanding of instructions that were given at this phone interview. Patient denies shortness of breath, chest pain, fever, cough a this phone interview.

## 2019-08-04 ENCOUNTER — Other Ambulatory Visit (HOSPITAL_COMMUNITY)
Admission: RE | Admit: 2019-08-04 | Discharge: 2019-08-04 | Disposition: A | Discharge status: Home / Self Care | Payer: Medicaid Other | Source: Ambulatory Visit | Attending: Obstetrics & Gynecology | Admitting: Obstetrics & Gynecology

## 2019-08-04 DIAGNOSIS — Z01812 Encounter for preprocedural laboratory examination: Secondary | ICD-10-CM | POA: Diagnosis not present

## 2019-08-04 DIAGNOSIS — Z20822 Contact with and (suspected) exposure to covid-19: Secondary | ICD-10-CM | POA: Diagnosis not present

## 2019-08-04 LAB — SARS CORONAVIRUS 2 (TAT 6-24 HRS): SARS Coronavirus 2: NEGATIVE

## 2019-08-08 ENCOUNTER — Ambulatory Visit (HOSPITAL_BASED_OUTPATIENT_CLINIC_OR_DEPARTMENT_OTHER): Payer: Medicaid Other | Admitting: Anesthesiology

## 2019-08-08 ENCOUNTER — Encounter (HOSPITAL_BASED_OUTPATIENT_CLINIC_OR_DEPARTMENT_OTHER): Payer: Self-pay | Admitting: Obstetrics & Gynecology

## 2019-08-08 ENCOUNTER — Ambulatory Visit (HOSPITAL_BASED_OUTPATIENT_CLINIC_OR_DEPARTMENT_OTHER)
Admission: RE | Admit: 2019-08-08 | Discharge: 2019-08-08 | Disposition: A | Discharge status: Home / Self Care | Payer: Medicaid Other | Attending: Obstetrics & Gynecology | Admitting: Obstetrics & Gynecology

## 2019-08-08 ENCOUNTER — Other Ambulatory Visit: Payer: Self-pay

## 2019-08-08 ENCOUNTER — Encounter (HOSPITAL_BASED_OUTPATIENT_CLINIC_OR_DEPARTMENT_OTHER)
Admission: RE | Disposition: A | Discharge status: Home / Self Care | Payer: Self-pay | Source: Home / Self Care | Attending: Obstetrics & Gynecology

## 2019-08-08 DIAGNOSIS — Z793 Long term (current) use of hormonal contraceptives: Secondary | ICD-10-CM | POA: Diagnosis not present

## 2019-08-08 DIAGNOSIS — D270 Benign neoplasm of right ovary: Secondary | ICD-10-CM | POA: Insufficient documentation

## 2019-08-08 DIAGNOSIS — N83201 Unspecified ovarian cyst, right side: Secondary | ICD-10-CM | POA: Diagnosis not present

## 2019-08-08 DIAGNOSIS — N9489 Other specified conditions associated with female genital organs and menstrual cycle: Secondary | ICD-10-CM

## 2019-08-08 HISTORY — DX: Unspecified ovarian cyst, unspecified side: N83.209

## 2019-08-08 HISTORY — PX: LAPAROSCOPIC OVARIAN CYSTECTOMY: SHX6248

## 2019-08-08 HISTORY — DX: Presence of spectacles and contact lenses: Z97.3

## 2019-08-08 LAB — CBC
HCT: 46.2 % — ABNORMAL HIGH (ref 36.0–46.0)
Hemoglobin: 15.6 g/dL — ABNORMAL HIGH (ref 12.0–15.0)
MCH: 33.1 pg (ref 26.0–34.0)
MCHC: 33.8 g/dL (ref 30.0–36.0)
MCV: 98.1 fL (ref 80.0–100.0)
Platelets: 302 10*3/uL (ref 150–400)
RBC: 4.71 MIL/uL (ref 3.87–5.11)
RDW: 12.5 % (ref 11.5–15.5)
WBC: 5.6 10*3/uL (ref 4.0–10.5)
nRBC: 0 % (ref 0.0–0.2)

## 2019-08-08 LAB — POCT PREGNANCY, URINE: Preg Test, Ur: NEGATIVE

## 2019-08-08 SURGERY — EXCISION, CYST, OVARY, LAPAROSCOPIC
Anesthesia: General | Site: Abdomen | Laterality: Right

## 2019-08-08 MED ORDER — OXYCODONE HCL 5 MG PO TABS
5.0000 mg | ORAL_TABLET | Freq: Once | ORAL | Status: DC | PRN
  Filled 2019-08-08: qty 1

## 2019-08-08 MED ORDER — GABAPENTIN 300 MG PO CAPS
ORAL_CAPSULE | ORAL | Status: AC
  Filled 2019-08-08: qty 1

## 2019-08-08 MED ORDER — ONDANSETRON HCL 4 MG/2ML IJ SOLN
INTRAMUSCULAR | Status: AC
  Filled 2019-08-08: qty 2

## 2019-08-08 MED ORDER — LACTATED RINGERS IV SOLN
INTRAVENOUS | Status: DC
  Filled 2019-08-08: qty 1000

## 2019-08-08 MED ORDER — SODIUM CHLORIDE 0.9 % IR SOLN
Status: DC | PRN
  Administered 2019-08-08: 3000 mL

## 2019-08-08 MED ORDER — FENTANYL CITRATE (PF) 100 MCG/2ML IJ SOLN
25.0000 ug | INTRAMUSCULAR | Status: DC | PRN
  Filled 2019-08-08: qty 1

## 2019-08-08 MED ORDER — DOCUSATE SODIUM 100 MG PO CAPS: 100.0000 mg | ORAL_CAPSULE | Freq: Two times a day (BID) | ORAL | 2 refills | Status: DC | PRN

## 2019-08-08 MED ORDER — ROCURONIUM BROMIDE 10 MG/ML (PF) SYRINGE
PREFILLED_SYRINGE | INTRAVENOUS | Status: DC | PRN
  Administered 2019-08-08: 50 mg via INTRAVENOUS

## 2019-08-08 MED ORDER — KETOROLAC TROMETHAMINE 30 MG/ML IJ SOLN
INTRAMUSCULAR | Status: DC | PRN
  Administered 2019-08-08: 30 mg via INTRAVENOUS

## 2019-08-08 MED ORDER — MIDAZOLAM HCL 2 MG/2ML IJ SOLN
INTRAMUSCULAR | Status: AC
  Filled 2019-08-08: qty 2

## 2019-08-08 MED ORDER — CEFAZOLIN SODIUM-DEXTROSE 2-4 GM/100ML-% IV SOLN
2.0000 g | INTRAVENOUS | Status: AC
  Administered 2019-08-08: 13:00:00 2 g via INTRAVENOUS
  Filled 2019-08-08: qty 100

## 2019-08-08 MED ORDER — FENTANYL CITRATE (PF) 100 MCG/2ML IJ SOLN
INTRAMUSCULAR | Status: DC | PRN
  Administered 2019-08-08 (×2): 50 ug via INTRAVENOUS
  Administered 2019-08-08: 100 ug via INTRAVENOUS

## 2019-08-08 MED ORDER — FENTANYL CITRATE (PF) 100 MCG/2ML IJ SOLN
INTRAMUSCULAR | Status: AC
  Filled 2019-08-08: qty 2

## 2019-08-08 MED ORDER — OXYCODONE HCL 5 MG PO TABS: 5.0000 mg | ORAL_TABLET | ORAL | 0 refills | Status: DC | PRN

## 2019-08-08 MED ORDER — ONDANSETRON HCL 4 MG/2ML IJ SOLN
INTRAMUSCULAR | Status: DC | PRN
  Administered 2019-08-08: 4 mg via INTRAVENOUS

## 2019-08-08 MED ORDER — DEXAMETHASONE SODIUM PHOSPHATE 10 MG/ML IJ SOLN
INTRAMUSCULAR | Status: AC
  Filled 2019-08-08: qty 1

## 2019-08-08 MED ORDER — ONDANSETRON HCL 4 MG/2ML IJ SOLN
4.0000 mg | Freq: Four times a day (QID) | INTRAMUSCULAR | Status: DC | PRN
  Filled 2019-08-08: qty 2

## 2019-08-08 MED ORDER — SOD CITRATE-CITRIC ACID 500-334 MG/5ML PO SOLN
30.0000 mL | ORAL | Status: DC
  Filled 2019-08-08: qty 30

## 2019-08-08 MED ORDER — CEFAZOLIN SODIUM-DEXTROSE 2-4 GM/100ML-% IV SOLN
INTRAVENOUS | Status: AC
  Filled 2019-08-08: qty 100

## 2019-08-08 MED ORDER — ACETAMINOPHEN 500 MG PO TABS
1000.0000 mg | ORAL_TABLET | ORAL | Status: AC
  Administered 2019-08-08: 11:00:00 1000 mg via ORAL
  Filled 2019-08-08: qty 2

## 2019-08-08 MED ORDER — LIDOCAINE 2% (20 MG/ML) 5 ML SYRINGE
INTRAMUSCULAR | Status: DC | PRN
  Administered 2019-08-08: 50 mg via INTRAVENOUS

## 2019-08-08 MED ORDER — SUGAMMADEX SODIUM 200 MG/2ML IV SOLN
INTRAVENOUS | Status: DC | PRN
  Administered 2019-08-08: 150 mg via INTRAVENOUS

## 2019-08-08 MED ORDER — BUPIVACAINE HCL (PF) 0.5 % IJ SOLN
INTRAMUSCULAR | Status: DC | PRN
  Administered 2019-08-08: 10 mL

## 2019-08-08 MED ORDER — PROPOFOL 10 MG/ML IV BOLUS
INTRAVENOUS | Status: AC
  Filled 2019-08-08: qty 20

## 2019-08-08 MED ORDER — ACETAMINOPHEN 500 MG PO TABS
ORAL_TABLET | ORAL | Status: AC
  Filled 2019-08-08: qty 2

## 2019-08-08 MED ORDER — KETOROLAC TROMETHAMINE 30 MG/ML IJ SOLN
INTRAMUSCULAR | Status: AC
  Filled 2019-08-08: qty 1

## 2019-08-08 MED ORDER — OXYCODONE HCL 5 MG/5ML PO SOLN
5.0000 mg | Freq: Once | ORAL | Status: DC | PRN
  Filled 2019-08-08: qty 5

## 2019-08-08 MED ORDER — ARTIFICIAL TEARS OPHTHALMIC OINT
TOPICAL_OINTMENT | OPHTHALMIC | Status: AC
  Filled 2019-08-08: qty 3.5

## 2019-08-08 MED ORDER — IBUPROFEN 600 MG PO TABS: 600.0000 mg | ORAL_TABLET | Freq: Four times a day (QID) | ORAL | 2 refills | Status: DC | PRN

## 2019-08-08 MED ORDER — ROCURONIUM BROMIDE 10 MG/ML (PF) SYRINGE
PREFILLED_SYRINGE | INTRAVENOUS | Status: AC
  Filled 2019-08-08: qty 10

## 2019-08-08 MED ORDER — DEXAMETHASONE SODIUM PHOSPHATE 10 MG/ML IJ SOLN
INTRAMUSCULAR | Status: DC | PRN
  Administered 2019-08-08: 10 mg via INTRAVENOUS

## 2019-08-08 MED ORDER — PROPOFOL 10 MG/ML IV BOLUS
INTRAVENOUS | Status: DC | PRN
  Administered 2019-08-08: 200 mg via INTRAVENOUS

## 2019-08-08 MED ORDER — LIDOCAINE 2% (20 MG/ML) 5 ML SYRINGE
INTRAMUSCULAR | Status: AC
  Filled 2019-08-08: qty 5

## 2019-08-08 MED ORDER — MIDAZOLAM HCL 2 MG/2ML IJ SOLN
INTRAMUSCULAR | Status: DC | PRN
  Administered 2019-08-08 (×2): 1 mg via INTRAVENOUS

## 2019-08-08 MED ORDER — GABAPENTIN 300 MG PO CAPS
300.0000 mg | ORAL_CAPSULE | ORAL | Status: AC
  Administered 2019-08-08: 300 mg via ORAL
  Filled 2019-08-08: qty 1

## 2019-08-08 SURGICAL SUPPLY — 34 items
ADH SKN CLS APL DERMABOND .7 (GAUZE/BANDAGES/DRESSINGS) ×1
BAG SPEC RTRVL LRG 6X4 10 (ENDOMECHANICALS) ×1
CABLE HIGH FREQUENCY MONO STRZ (ELECTRODE) IMPLANT
CATH ROBINSON RED A/P 16FR (CATHETERS) ×3 IMPLANT
COVER MAYO STAND STRL (DRAPES) ×3 IMPLANT
DERMABOND ADVANCED (GAUZE/BANDAGES/DRESSINGS) ×2
DERMABOND ADVANCED .7 DNX12 (GAUZE/BANDAGES/DRESSINGS) ×1 IMPLANT
DRSG OPSITE POSTOP 3X4 (GAUZE/BANDAGES/DRESSINGS) ×2 IMPLANT
DURAPREP 26ML APPLICATOR (WOUND CARE) ×3 IMPLANT
GAUZE 4X4 16PLY RFD (DISPOSABLE) ×3 IMPLANT
GLOVE BIO SURGEON STRL SZ 6.5 (GLOVE) ×2 IMPLANT
GLOVE BIO SURGEONS STRL SZ 6.5 (GLOVE) ×1
GLOVE BIOGEL PI IND STRL 7.0 (GLOVE) ×4 IMPLANT
GLOVE BIOGEL PI INDICATOR 7.0 (GLOVE) ×8
GLOVE ECLIPSE 7.0 STRL STRAW (GLOVE) ×3 IMPLANT
GOWN STRL REUS W/ TWL LRG LVL3 (GOWN DISPOSABLE) ×2 IMPLANT
GOWN STRL REUS W/TWL LRG LVL3 (GOWN DISPOSABLE) ×6
NDL INSUFFLATION 14GA 120MM (NEEDLE) IMPLANT
NEEDLE INSUFFLATION 14GA 120MM (NEEDLE) IMPLANT
NS IRRIG 1000ML POUR BTL (IV SOLUTION) ×3 IMPLANT
PACK LAPAROSCOPY BASIN (CUSTOM PROCEDURE TRAY) ×3 IMPLANT
PACK TRENDGUARD 450 HYBRID PRO (MISCELLANEOUS) IMPLANT
POUCH SPECIMEN RETRIEVAL 10MM (ENDOMECHANICALS) ×2 IMPLANT
PROTECTOR NERVE ULNAR (MISCELLANEOUS) ×6 IMPLANT
SET IRRIG TUBING LAPAROSCOPIC (IRRIGATION / IRRIGATOR) IMPLANT
SET TUBE SMOKE EVAC HIGH FLOW (TUBING) ×3 IMPLANT
SHEARS HARMONIC ACE PLUS 36CM (ENDOMECHANICALS) ×2 IMPLANT
SUT MNCRL AB 4-0 PS2 18 (SUTURE) ×3 IMPLANT
SUT VICRYL 0 UR6 27IN ABS (SUTURE) ×2 IMPLANT
TOWEL OR 17X26 10 PK STRL BLUE (TOWEL DISPOSABLE) ×3 IMPLANT
TRAY FOLEY W/BAG SLVR 14FR (SET/KITS/TRAYS/PACK) IMPLANT
TRENDGUARD 450 HYBRID PRO PACK (MISCELLANEOUS)
TROCAR BLADELESS OPT 5 100 (ENDOMECHANICALS) ×6 IMPLANT
TROCAR XCEL NON-BLD 11X100MML (ENDOMECHANICALS) ×3 IMPLANT

## 2019-08-08 NOTE — Anesthesia Preprocedure Evaluation (Signed)
Anesthesia Evaluation  Patient identified by MRN, date of birth, ID band Patient awake    Reviewed: Allergy & Precautions, H&P , NPO status , Patient's Chart, lab work & pertinent test results  Airway Mallampati: II   Neck ROM: full    Dental   Pulmonary neg pulmonary ROS,    breath sounds clear to auscultation       Cardiovascular negative cardio ROS   Rhythm:regular Rate:Normal     Neuro/Psych    GI/Hepatic   Endo/Other    Renal/GU      Musculoskeletal   Abdominal   Peds  Hematology   Anesthesia Other Findings   Reproductive/Obstetrics                             Anesthesia Physical Anesthesia Plan  ASA: II  Anesthesia Plan: General   Post-op Pain Management:    Induction: Intravenous  PONV Risk Score and Plan: 3 and Ondansetron, Dexamethasone, Midazolam and Treatment may vary due to age or medical condition  Airway Management Planned: Oral ETT  Additional Equipment:   Intra-op Plan:   Post-operative Plan: Extubation in OR  Informed Consent: I have reviewed the patients History and Physical, chart, labs and discussed the procedure including the risks, benefits and alternatives for the proposed anesthesia with the patient or authorized representative who has indicated his/her understanding and acceptance.       Plan Discussed with: CRNA, Anesthesiologist and Surgeon  Anesthesia Plan Comments:         Anesthesia Quick Evaluation

## 2019-08-08 NOTE — Anesthesia Procedure Notes (Signed)
Procedure Name: Intubation Date/Time: 08/08/2019 12:47 PM Performed by: Wanita Chamberlain, CRNA Pre-anesthesia Checklist: Patient identified, Emergency Drugs available, Suction available and Patient being monitored Patient Re-evaluated:Patient Re-evaluated prior to induction Oxygen Delivery Method: Circle system utilized Preoxygenation: Pre-oxygenation with 100% oxygen Induction Type: IV induction Ventilation: Mask ventilation without difficulty Laryngoscope Size: Mac and 3 Grade View: Grade II Tube type: Oral Tube size: 7.0 mm Number of attempts: 1 Airway Equipment and Method: Bite block Placement Confirmation: breath sounds checked- equal and bilateral,  CO2 detector,  positive ETCO2 and ETT inserted through vocal cords under direct vision Secured at: 22 cm Dental Injury: Teeth and Oropharynx as per pre-operative assessment

## 2019-08-08 NOTE — Op Note (Signed)
Cassandra [name] PROCEDURE DATE: 08/08/2019  PREOPERATIVE DIAGNOSES: Symptomatic large right ovarian cyst POSTOPERATIVE DIAGNOSES: The same PROCEDURE: Laparoscopic right oophorectomy SURGEON:  Dr. Verita Schneiders ASSISTANT:  Dr. Emeterio Reeve. An experienced assistant was required given the standard of surgical care given the complexity of the case.  This assistant was needed for exposure, dissection, suctioning, retraction, instrument exchange, and for overall help during the procedure. ANESTHESIOLOGY TEAM: Anesthesiologist: Albertha Ghee, MD CRNA: Mechele Claude, CRNA; Wanita Chamberlain, CRNA  INDICATIONS: 29 y.o. [phone] with aforementioned preoperative diagnoses here today for definitive surgical management.   Risks of surgery were discussed with the patient including but not limited to: bleeding which may require transfusion or reoperation; infection which may require antibiotics; injury to bowel, bladder, ureters or other surrounding organs; need for additional procedures including laparotomy; thromboembolic phenomenon, incisional problems and other postoperative/anesthesia complications. Written informed consent was obtained.    FINDINGS:  Small uterus, right adnexa with 7-8 cm large ovary torsed on the infundibulopelvic ligament but still viable.  Cyst was noted to take over the entire ovary, no normal tissue seen.  Cyst contained sebaceous material and hair consistent with dermoid cyst.  Normal left adnexa.  Two small black endometriosis lesions notes around left uterosacral ligament, and some clear lesions noted on right pelvic side wall.   No evidence of adhesions or any other abdominal/pelvic abnormality.  Normal upper abdomen.  ANESTHESIA:    General ESTIMATED BLOOD LOSS: 10 ml SPECIMENS:  Right ovary containing cyst COMPLICATIONS: None immediate   PROCEDURE IN DETAIL:  The patient received intravenous antibiotics and had sequential compression devices applied to her lower  extremities while in the preoperative area.  She was then taken to the operating room where general anesthesia was administered and was found to be adequate.  She was placed in the dorsal lithotomy position, and was prepped and draped in a sterile manner.  A Foley catheter was inserted into her bladder and attached to constant drainage and a uterine manipulator was then advanced into the uterus . After an adequate timeout was performed, attention was then turned to the patient's abdomen where a 11-mm skin incision was made in the umbilical fold.  The Optiview 11-mm trocar and sleeve were then advanced without difficulty with the laparoscope under direct visualization into the abdomen.  The abdomen was then insufflated with carbon dioxide gas.  Adequate pneumoperitoneum was obtained.  A survey of the patient's pelvis and abdomen revealed the findings above. Bilateral 5-mm lower quadrant ports were then placed under direct visualization. On the right side, the Harmonic device was used to make a small incision on the ovary after it was untorsed from the infundibulopelvic ligament. The cyst was encountered and safely drained of sebaceous content.  No significant leakage to peritoneal cavity. Multiple attempts were made to separate cyst wall from ovary but the cyst was noted to take up all the ovary, and the cyst wall was tightly adherent.  At this point, the decision was made to proceed with oophorectomy.  The right uteroovarian ligament was then clamped and transected with the Harmonic device.  The right infundibulopelvic ligament was also clamped and transected allowing for oophorectomy.  The right fallopian tube was intact and left in place. Excellent hemostasis was noted. The specimen was then removed from the abdomen through the 11-mm port using an Endocatch bag, under direct visualization.  The operative site was surveyed, and it was found to be hemostatic.  Copious irrigation was done of the pelvis and  abdomen  using several liters of normal saline.  No intraoperative injury to other surrounding organs was noted.  The abdomen was desufflated and all instruments were then removed from the patient's abdomen. The fascial incision of the umbilicus was closed with a 0 Vicryl figure of eight stitch.  All skin incisions were closed with 4-0 Monocryl subcuticular stitches and Dermabond.  The patient tolerated the procedure well.  Sponge, lap, and needle counts were correct times three.  The patient was then taken to the recovery room awake, extubated and in stable condition.  The patient will be discharged to home as per PACU criteria.  Routine postoperative instructions given.  She was prescribed Oxycodone, Ibuprofen and Colace.  She will follow up in the office in 2-3 weeks for postoperative evaluation.   Verita Schneiders, MD, [place] [facility], [place]

## 2019-08-08 NOTE — Discharge Instructions (Addendum)
Post Anesthesia Home Care Instructions  Activity: Get plenty of rest for the remainder of the day. A responsible adult should stay with you for 24 hours following the procedure.  For the next 24 hours, DO NOT: -Drive a car -Paediatric nurse -Drink alcoholic beverages -Take any medication unless instructed by your physician -Make any legal decisions or sign important papers.  Meals: Start with liquid foods such as gelatin or soup. Progress to regular foods as tolerated. Avoid greasy, spicy, heavy foods. If nausea and/or vomiting occur, drink only clear liquids until the nausea and/or vomiting subsides. Call your physician if vomiting continues.  Special Instructions/Symptoms: Your throat may feel dry or sore from the anesthesia or the breathing tube placed in your throat during surgery. If this causes discomfort, gargle with warm salt water. The discomfort should disappear within 24 hours.  If you had a scopolamine patch placed behind your ear for the management of post- operative nausea and/or vomiting:  1. The medication in the patch is effective for 72 hours, after which it should be removed.  Wrap patch in a tissue and discard in the trash. Wash hands thoroughly with soap and water. 2. You may remove the patch earlier than 72 hours if you experience unpleasant side effects which may include dry mouth, dizziness or visual disturbances. 3. Avoid touching the patch. Wash your hands with soap and water after contact with the patch.   Laparoscopic Surgery - Care After Laparoscopy is a surgical procedure. It is used to diagnose and treat diseases inside the belly(abdomen). It is usually a brief, common, and relatively simple procedure. The laparoscopeis a thin, lighted, pencil-sized instrument. It is like a telescope. It is inserted into your abdomen through a small cut (incision). Your caregiver can look at the organs inside your body through this instrument.  She can see if there is  anything abnormal. Laparoscopy can be done either in a hospital or outpatient clinic. You may be given a mild sedative to help you relax before the procedure. Once in the operating room, you will be given a drug to make you sleep (general anesthesia). Laparoscopy usually lasts about 1 hour. After the procedure, you will be monitored in a recovery area until you are stable and doing well. Once you are home, it may take 3 to 7 days to fully recover.   Laparoscopy has relatively few risks. Your caregiver will discuss the risks with you before the procedure. Some problems that can occur include: RISKS AND COMPLICATIONS   Allergies to medicines.  Difficulty breathing.  Bleeding.  Infection.  Damage to other surrounding structures HOME CARE INSTRUCTIONS   Infection.  Bleeding.  Damage to other organs.  Anesthetic side effects.   Need for additional procedures such as open procedures/laparotomy PROCEDURE Once you receive anesthesia, your surgeon inflates the abdomen with a harmless gas (carbon dioxide). This makes the organs easier to see. The laparoscope is inserted into the abdomen through a small incision. This allows your surgeon to see into the abdomen. Other small instruments are also inserted into the abdomen through other small openings. Many surgeons attach a video camera to the laparoscope to enlarge the view. During a laparoscopy, the surgeon may be looking for inflammation, infection, or cancer.  The surgeon may also need to take out certain organs or take tissue samples (biopsies). The specimens are sent to a specialist in looking at cells and tissue samples (pathologist). The pathologist examines them under a microscope to help to diagnose or confirm a  disease. AFTER THE PROCEDURE   The incisions are closed with stitches (sutures) and Dermabond. Because these incisions are small (usually less than 1/2 inch), there is usually minimal discomfort after the procedure. There may  also be discomfort from the instrument placement incisions in the abdomen. You will be given pain medicine to ease any discomfort.  You will rest in a recovery room for 1-2 hours until you are stable and doing well.  You may have some mild discomfort in the throat. This is from the tube placed in your throat while you were sleeping.  You may experience discomfort in the shoulder area from some trapped air between the liver and diaphragm. This sensation is normal and will slowly go away on its own.  The recovery time is shortened as long as there are no complications.  You will rest in a recovery room until stable and doing well. As long as there are no complications, you may be allowed to go home. Someone will need to drive you home and be with you for at least 24 hours once home. FINDING OUT THE RESULTS You will be called with the results of the pathology and will discuss these results with  your caregiver during your postoperative appointment. Do not assume everything is normal if you have not heard from your caregiver or the medical facility. It is important for you to follow up on all of your results. HOME CARE INSTRUCTIONS   Take all medicines as directed.  Only take over-the-counter or prescription medicines for pain, discomfort, or fever as directed by your caregiver.  Resume daily activities as directed.  Showers are preferred over baths.  You may resume sexual activities in 1 week or as directed.  Do not drive while taking narcotics. SEEK MEDICAL CARE IF:  There is increasing abdominal pain.  You feel lightheaded or faint.  You have the chills.  You have an oral temperature above 102 F (38.9 C).  There is pus-like (purulent) drainage from any of the wounds.  You are unable to pass gas or have a bowel movement.  You feel sick to your stomach (nauseous) or throw up (vomit). MAKE SURE YOU:   Understand these instructions.  Will watch your condition.  Will get  help right away if you are not doing well or get worse.  ExitCare Patient Information 2013 [place].

## 2019-08-08 NOTE — H&P (Signed)
Preoperative History and Physical  Cassandra [name] is a 29 y.o. [id number] here for surgical management of symptomatic large dermoid cyst.   No significant preoperative concerns.  Proposed surgery: Laparoscopic ovarian cystectomy, possible oophorectomy.   Past Medical History:  Diagnosis Date  . Ovarian cyst    symptomatic  . Wears glasses    Past Surgical History:  Procedure Laterality Date  . WISDOM TOOTH EXTRACTION  2008   OB History  Gravida Para Term Preterm AB Living  3 3 3  0 0 3  SAB TAB Ectopic Multiple Live Births  0 0 0 0 3    # Outcome Date GA Lbr Len/2nd Weight Sex Delivery Anes PTL Lv  3 Term 02/25/19 [redacted]w[redacted]d 01:58 / 00:17 3820 g M Vag-Spont EPI  LIV  2 Term 05/27/13 [redacted]w[redacted]d 02:13 / 00:20 3084 g F Vag-Spont None  LIV  1 Term 01/14/12 [redacted]w[redacted]d 13:14 / 03:36 3745 g M Vag-Spont EPI  LIV    Obstetric Comments  Induced for postdates.   Patient denies any other pertinent gynecologic issues.   No current facility-administered medications on file prior to encounter.   Current Outpatient Medications on File Prior to Encounter  Medication Sig Dispense Refill  . medroxyPROGESTERone (DEPO-PROVERA) 150 MG/ML injection Inject 150 mg into the muscle every 3 (three) months.    . Prenatal Vit-Fe Fumarate-FA (PRENATAL MULTIVITAMIN) TABS tablet Take 1 tablet by mouth daily. 90 tablet 2  . acetaminophen (TYLENOL) 325 MG tablet Take 2 tablets (650 mg total) by mouth every 4 (four) hours as needed (for pain scale < 4).     No Known Allergies  Social History:   reports that she has never smoked. She has never used smokeless tobacco. She reports that she does not drink alcohol or use drugs.  Family History  Problem Relation Age of Onset  . Asthma Mother   . Diabetes Mother   . Asthma Sister   . Asthma Brother   . Cancer Maternal Uncle   . Hypertension Paternal Grandfather     Review of Systems: Pertinent items noted in HPI and remainder of comprehensive ROS otherwise  negative.  PHYSICAL EXAM: Blood pressure 119/85, pulse 67, temperature 97.8 F (36.6 C), temperature source Oral, resp. rate 16, height 5\' 5"  (1.651 m), weight 83.5 kg, SpO2 100 %, currently breastfeeding. CONSTITUTIONAL: Well-developed, well-nourished female in no acute distress.  HENT:  Normocephalic, atraumatic, External right and left ear normal. Oropharynx is clear and moist EYES: Conjunctivae and EOM are normal. Pupils are equal, round, and reactive to light. No scleral icterus.  NECK: Normal range of motion, supple, no masses SKIN: Skin is warm and dry. No rash noted. Not diaphoretic. No erythema. No pallor. NEUROLOGIC: Alert and oriented to person, place, and time. Normal reflexes, muscle tone coordination. No cranial nerve deficit noted. PSYCHIATRIC: Normal mood and affect. Normal behavior. Normal judgment and thought content. CARDIOVASCULAR: Normal heart rate noted, regular rhythm RESPIRATORY: Effort and breath sounds normal, no problems with respiration noted ABDOMEN: Soft, nontender, nondistended. PELVIC: Deferred MUSCULOSKELETAL: Normal range of motion. No edema and no tenderness. 2+ distal pulses.  Labs: Results for orders placed or performed during the hospital encounter of 08/08/19 (from the past 336 hour(s))  CBC   Collection Time: 08/08/19 10:55 AM  Result Value Ref Range   WBC 5.6 4.0 - 10.5 K/uL   RBC 4.71 3.87 - 5.11 MIL/uL   Hemoglobin 15.6 (H) 12.0 - 15.0 g/dL   HCT 46.2 (H) 36.0 - 46.0 %  MCV 98.1 80.0 - 100.0 fL   MCH 33.1 26.0 - 34.0 pg   MCHC 33.8 30.0 - 36.0 g/dL   RDW 12.5 11.5 - 15.5 %   Platelets 302 150 - 400 K/uL   nRBC 0.0 0.0 - 0.2 %  Pregnancy, urine POC   Collection Time: 08/08/19 11:12 AM  Result Value Ref Range   Preg Test, Ur NEGATIVE NEGATIVE  Results for orders placed or performed during the hospital encounter of 08/04/19 (from the past 336 hour(s))  SARS CORONAVIRUS 2 (TAT 6-24 HRS) Nasopharyngeal Nasopharyngeal Swab   Collection  Time: 08/04/19  8:33 AM   Specimen: Nasopharyngeal Swab  Result Value Ref Range   SARS Coronavirus 2 NEGATIVE NEGATIVE    Imaging Studies: US Pelvic Complete With Transvaginal  Result Date: 05/08/2019 CLINICAL DATA:  Adnexal mass, follow-up EXAM: TRANSABDOMINAL AND TRANSVAGINAL ULTRASOUND OF PELVIS TECHNIQUE: Both transabdominal and transvaginal ultrasound examinations of the pelvis were performed. Transabdominal technique was performed for global imaging of the pelvis including uterus, ovaries, adnexal regions, and pelvic cul-de-sac. It was necessary to proceed with endovaginal exam following the transabdominal exam to visualize the adnexa. COMPARISON:  12/05/2018 Correlation: MR pelvis 09/02/2018 FINDINGS: Uterus Measurements: 8.5 x 4.1 x 5.8 cm = volume: 106 mL. Anteverted. No intrauterine mass identified. Endometrium Thickness: 12 mm.  No endometrial fluid or focal abnormality Right ovary Measurements: 2.8 x 1.3 x 1.5 cm = volume: 2.7 mL. Normal morphology without mass Left ovary Measurements: 2.6 x 1.1 x 1.4 cm = volume: 2.0 mL. Normal morphology without mass Other findings Trace free pelvic fluid. RIGHT adnexal mass identified 6.4 x 5.9 x 6.1 cm, heterogeneous in appearance, with areas of lower echogenicity as well as hyperechoic nonshadowing regions. Sonographic appearance is most consistent with a dermoid tumor; this corresponds to the dermoid tumor identified on the prior MRI. 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 Remaining ultrasound exam unremarkable. Electronically Signed   By: Lavonia Dana M.D.   On: 05/08/2019 12:03   Assessment: Patient Active Problem List   Diagnosis Date Noted  . Adnexal mass 07/26/2018    Plan: Patient will undergo surgical management with laparoscopic ovarian cystectomy, possible oophorectomy.  The risks of surgery were  discussed in detail with the patient including but not limited to: bleeding which may require transfusion or reoperation; infection which may require antibiotics; injury to surrounding organs which may involve bowel, bladder, ureters ; need for additional procedures including laparotomy; thromboembolic phenomenon, surgical site problems and other postoperative/anesthesia complications. Likelihood of success in alleviating the patient's condition was discussed. Routine postoperative instructions will be reviewed with the patient and her family in detail after surgery.  The patient concurred with the proposed plan, giving informed written consent for the surgery.  Patient has been NPO since last night and she will remain NPO for procedure.  Anesthesia and OR aware.  Preoperative prophylactic antibiotics and SCDs ordered on call to the OR.  To OR when ready.    Verita Schneiders, MD, [place] for [employer], [place]

## 2019-08-08 NOTE — Transfer of Care (Signed)
Immediate Anesthesia Transfer of Care Note  Patient: Cassandra [name]  Procedure(s) Performed: LAPAROSCOPIC OVARIAN CYSTECTOMY AND RIGHT OOPHORECTOMY (Right Abdomen)  Patient Location: PACU  Anesthesia Type:General  Level of Consciousness: awake, alert , oriented and patient cooperative  Airway & Oxygen Therapy: Patient Spontanous Breathing and Patient connected to nasal cannula oxygen  Post-op Assessment: Report given to RN and Post -op Vital signs reviewed and stable  Post vital signs: Reviewed and stable  Last Vitals:  Vitals Value Taken Time  BP    Temp    Pulse 83 08/08/19 1438  Resp 17 08/08/19 1438  SpO2 100 % 08/08/19 1438  Vitals shown include unvalidated device data.  Last Pain:  Vitals:   08/08/19 1110  TempSrc: Oral         Complications: No apparent anesthesia complications

## 2019-08-09 ENCOUNTER — Telehealth: Payer: Self-pay | Admitting: Family Medicine

## 2019-08-09 NOTE — Telephone Encounter (Signed)
Spoke to patient and she did return to work on 06-06-19, after having the baby and was out again on 08-04-19 due to having her covid test prior to surgery.  She has an appointment to see gyn on 08/28/19 and hopes to be released back to work after that.  I advised her it would likely be best that her OBGYN fill out these forms since she would be the one releasing her to go back to work and officially took her out.  Patient voiced understanding.  I will update the provider, so forms can be placed at the front desk for patient to take to her gynecologist.  Johnney Ou

## 2019-08-09 NOTE — Telephone Encounter (Signed)
Called patient regarding FMLA paperwork but had to leave voicemail.  If she returns call - Did she go back to work after her baby was born or has she been out until her surgery yesterday?  How much time is she planning to be out (would need specific dates)?  Thanks!

## 2019-08-09 NOTE — Telephone Encounter (Signed)
Forms placed up front for patient pick up.

## 2019-08-09 NOTE — Anesthesia Postprocedure Evaluation (Signed)
Anesthesia Post Note  Patient: Cassandra [name]  Procedure(s) Performed: LAPAROSCOPIC OVARIAN CYSTECTOMY AND RIGHT OOPHORECTOMY (Right Abdomen)     Patient location during evaluation: PACU Anesthesia Type: General Level of consciousness: awake and alert Pain management: pain level controlled Vital Signs Assessment: post-procedure vital signs reviewed and stable Respiratory status: spontaneous breathing, nonlabored ventilation, respiratory function stable and patient connected to nasal cannula oxygen Cardiovascular status: blood pressure returned to baseline and stable Postop Assessment: no apparent nausea or vomiting Anesthetic complications: no    Last Vitals:  Vitals:   08/08/19 1545 08/08/19 1626  BP:  125/86  Pulse: 79 90  Resp: 15 14  Temp:  36.7 C  SpO2: 100% 100%    Last Pain:  Vitals:   08/09/19 0943  TempSrc:   PainSc: [place]

## 2019-08-10 LAB — SURGICAL PATHOLOGY

## 2019-08-28 ENCOUNTER — Other Ambulatory Visit: Payer: Self-pay

## 2019-08-28 ENCOUNTER — Ambulatory Visit (INDEPENDENT_AMBULATORY_CARE_PROVIDER_SITE_OTHER): Payer: Medicaid Other | Admitting: Obstetrics & Gynecology

## 2019-08-28 ENCOUNTER — Encounter: Payer: Self-pay | Admitting: Obstetrics & Gynecology

## 2019-08-28 VITALS — BP 117/85 | HR 62 | Ht 65.0 in | Wt 189.9 lb

## 2019-08-28 DIAGNOSIS — N921 Excessive and frequent menstruation with irregular cycle: Secondary | ICD-10-CM

## 2019-08-28 DIAGNOSIS — Z90721 Acquired absence of ovaries, unilateral: Secondary | ICD-10-CM

## 2019-08-28 MED ORDER — ESTRADIOL 2 MG PO TABS: 2.0000 mg | ORAL_TABLET | Freq: Every day | ORAL | 1 refills | Status: DC

## 2019-08-28 NOTE — Progress Notes (Signed)
Subjective:     Cassandra [name] is a 29 y.o. female who presents to the clinic 3 weeks status post laparoscopic right oophorectomy for symptomatic large right ovarian cyst/dermoid cyst. Eating a regular diet without difficulty. Bowel movements are normal. Pain is controlled with current analgesics. Medications being used: prescription NSAID's including Ibuprofen.  Patient reports breakthrough bleeding (BTB) since surgery, despite being on long tem Depo Provera (last dose was 07/2019). Using 4-5 pads a day. No symptoms of anemia.  The following portions of the patient's history were reviewed and updated as appropriate: allergies, current medications, past family history, past medical history, past social history, past surgical history and problem list. Normal pap in 07/2018.  Review of Systems Pertinent items noted in HPI and remainder of comprehensive ROS otherwise negative.    Objective:    BP 117/85   Pulse 62   Ht 5\' 5"  (1.651 m)   Wt 189 lb 14.4 oz (86.1 kg)   BMI 31.60 kg/m  General:  alert and no distress  Abdomen: soft, bowel sounds active, non-tender  Incision:   healing well, no drainage, no erythema, no hernia, no seroma, no swelling, no dehiscence, incision well approximated     Assessment:    Doing well postoperatively. Operative findings again reviewed. Pathology report discussed, showed mature teratoma. Breakthrough bleeding on Depo Provera.    Plan:    1. Continue any current medications. 2. Wound care discussed. 3. Activity restrictions: none 4. 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 5. Estrace 2 mg po qd x 7 days prescribed for BTB on Depo provera. Likely 2/2 hormonal changes s/p oophorectomy.  Will monitor response. Also advised to take Ibuprofen to help with the BTB. If no alleviation of symptoms, may need OCPs x 21 days (Sprintec).  6. Follow up as needed.     Verita Schneiders, MD, [place] for  [employer], [place]

## 2019-08-28 NOTE — Patient Instructions (Signed)
Return to clinic for any scheduled appointments or for any gynecologic concerns as needed.   

## 2019-09-21 DIAGNOSIS — Z23 Encounter for immunization: Secondary | ICD-10-CM | POA: Diagnosis not present

## 2019-10-12 ENCOUNTER — Telehealth: Payer: Self-pay

## 2019-10-12 DIAGNOSIS — Z23 Encounter for immunization: Secondary | ICD-10-CM | POA: Diagnosis not present

## 2019-10-12 NOTE — Telephone Encounter (Signed)
Patient calls nurse line stating she would like to start using Nexplanon for [facility]. Patient reports she is currently on depo, however having undesired side effects. Patient reports her next shot is due "soon" and would like to have Nexplanon placed before then. Per epic, next depo is due no later than 7/2. I see she has an apt with PCP on 6/30. If ok we can do then, or I can schedule her for colpo clinic on 7/1. Please advise.

## 2019-10-13 NOTE — Telephone Encounter (Signed)
Yes. Since she is still in her depo window and is just insertion, should be fine.

## 2019-10-13 NOTE — Telephone Encounter (Signed)
This is fine 

## 2019-10-18 ENCOUNTER — Ambulatory Visit: Payer: Medicaid Other | Admitting: Family Medicine

## 2019-10-27 ENCOUNTER — Ambulatory Visit (INDEPENDENT_AMBULATORY_CARE_PROVIDER_SITE_OTHER): Payer: Medicaid Other | Admitting: Family Medicine

## 2019-10-27 ENCOUNTER — Encounter: Payer: Self-pay | Admitting: Family Medicine

## 2019-10-27 ENCOUNTER — Other Ambulatory Visit: Payer: Self-pay

## 2019-10-27 VITALS — BP 108/76 | HR 90 | Wt 193.2 lb

## 2019-10-27 DIAGNOSIS — Z3046 Encounter for surveillance of implantable subdermal contraceptive: Secondary | ICD-10-CM | POA: Diagnosis not present

## 2019-10-27 DIAGNOSIS — Z3049 Encounter for surveillance of other contraceptives: Secondary | ICD-10-CM | POA: Diagnosis not present

## 2019-10-27 DIAGNOSIS — Z30019 Encounter for initial prescription of contraceptives, unspecified: Secondary | ICD-10-CM | POA: Diagnosis not present

## 2019-10-27 LAB — POCT URINE PREGNANCY: Preg Test, Ur: NEGATIVE

## 2019-10-27 MED ORDER — ETONOGESTREL 68 MG ~~LOC~~ IMPL
68.0000 mg | DRUG_IMPLANT | Freq: Once | SUBCUTANEOUS | Status: AC
  Administered 2019-10-27: 68 mg via SUBCUTANEOUS

## 2019-10-27 NOTE — Progress Notes (Signed)
    SUBJECTIVE:   CHIEF COMPLAINT / HPI:   Cassandra [name] is a 29 y.o. female here for contraception.    Contraception Patient is a G3P3 who received Depot shot last on 07/31/19.  She is currently sexually active with one female partner and desires contraception.  Reports she has been gaining weight.  Has a history of right oophorectomy secondary to cyst.    PERTINENT  PMH / PSH: Reviewed, oophorectomy on the right  OBJECTIVE:   BP 108/76   Pulse 90   Wt 193 lb 3.2 oz (87.6 kg)   LMP 10/16/2019   SpO2 99%   Breastfeeding Yes   BMI 32.15 kg/m    GEN: well appearing female in no acute distress  CVS: well perfused, regular rate, radial pulses palpable RESP: speaking in full sentences without pause, no increased work of breathing ABD: soft, non-tender, non-distended, no palpable masses  Pelvic exam: Deferred MSK: normla range of motion bilateral upper extremities SKIN: left arm with 3 scars related to Nexplanon insertion vs removal, warm, dry     ASSESSMENT/PLAN:   Contraception management Education given regarding options for contraception, including barrier methods, injectable contraception, IUD placement, oral contraceptives. Patient desires long-term reversible contraception.  Patient would like to switch from Depo to [place].  Patient has had Nexplanon in the past.     Date last pap: 07/26/18  Last Depo-Provera: 07/31/19 Side Effects if any: weight gain  Serum HCG indicated? No  Next appointment due: 10/27/2022  PRE-OP DIAGNOSIS: desired long-term, reversible contraception   POST-OP DIAGNOSIS: Same   PROCEDURE: Nexplanon  placement  Performing Physician: Lyndee Hensen   PROCEDURE:  Urine pregnancy test: Negative Written informed consent obtained Site (check): Left arm       Sterile Preparation:    [x]       Betadine        [_]     Chloraprep             After a time-out, insertion site was selected 6 - 10 cm from medial epicondyle and 3-5 cm inferior  from sulcus and marked. Procedure area was prepped and draped in a sterile fashion. 3 mL of 1% lidocaine with epinephrine was used for subcutaneous anesthesia. Anesthesia confirmed.  Nexplanon  trocar was inserted subcutaneously and then Nexplanon  capsule delivered subcutaneously. Trocar was removed from the insertion site. Nexplanon  capsule was palpated by provider and patient to assure satisfactory placement.  Estimated blood loss: minimal Dressings applied: 2 x 2 with Kerlix Followup: The patient tolerated the procedure well without complications.  Standard post-procedure care wass explained and return precautions were given.  Lot number: [id number]  Lyndee Hensen, DO PGY-2,  [facility] 10/27/2019 3:53 PM

## 2019-10-27 NOTE — Patient Instructions (Addendum)
It was great seeing you today!   I'd like to see you back as needed but if you need to be seen earlier than that for any new issues we're happy to fit you in, just give [place] a call!  - Remember to check your arm for your Nexplanon monthly  - Do not play with the Nexplanon while it is in your arm as you risk bending the implant  - If you begin to have side effects please call the clinic      If you have questions or concerns please do not hesitate to call at [phone].  Dr. Rushie [facility]

## 2019-10-30 NOTE — Assessment & Plan Note (Signed)
Education given regarding options for contraception, including barrier methods, injectable contraception, IUD placement, oral contraceptives. Patient desires long-term reversible contraception.  Patient would like to switch from Depo to [place].  Patient has had Nexplanon in the past.

## 2020-06-11 ENCOUNTER — Emergency Department (HOSPITAL_COMMUNITY)
Admission: EM | Admit: 2020-06-11 | Discharge: 2020-06-12 | Disposition: A | Discharge status: Home / Self Care | Payer: Medicaid Other | Attending: Emergency Medicine | Admitting: Emergency Medicine

## 2020-06-11 ENCOUNTER — Encounter (HOSPITAL_COMMUNITY): Payer: Self-pay | Admitting: Emergency Medicine

## 2020-06-11 ENCOUNTER — Other Ambulatory Visit: Payer: Self-pay

## 2020-06-11 ENCOUNTER — Emergency Department (HOSPITAL_COMMUNITY): Payer: Medicaid Other

## 2020-06-11 DIAGNOSIS — S81001D Unspecified open wound, right knee, subsequent encounter: Secondary | ICD-10-CM | POA: Diagnosis not present

## 2020-06-11 DIAGNOSIS — Z23 Encounter for immunization: Secondary | ICD-10-CM | POA: Insufficient documentation

## 2020-06-11 DIAGNOSIS — S8991XA Unspecified injury of right lower leg, initial encounter: Secondary | ICD-10-CM | POA: Diagnosis not present

## 2020-06-11 DIAGNOSIS — W010XXA Fall on same level from slipping, tripping and stumbling without subsequent striking against object, initial encounter: Secondary | ICD-10-CM | POA: Insufficient documentation

## 2020-06-11 DIAGNOSIS — T8130XA Disruption of wound, unspecified, initial encounter: Secondary | ICD-10-CM | POA: Diagnosis not present

## 2020-06-11 DIAGNOSIS — S81011A Laceration without foreign body, right knee, initial encounter: Secondary | ICD-10-CM | POA: Diagnosis not present

## 2020-06-11 DIAGNOSIS — W19XXXA Unspecified fall, initial encounter: Secondary | ICD-10-CM

## 2020-06-11 DIAGNOSIS — Z4802 Encounter for removal of sutures: Secondary | ICD-10-CM | POA: Diagnosis not present

## 2020-06-11 MED ORDER — TETANUS-DIPHTH-ACELL PERTUSSIS 5-2.5-18.5 LF-MCG/0.5 IM SUSY
0.5000 mL | PREFILLED_SYRINGE | Freq: Once | INTRAMUSCULAR | Status: AC
  Administered 2020-06-11: 0.5 mL via INTRAMUSCULAR
  Filled 2020-06-11: qty 0.5

## 2020-06-11 NOTE — ED Provider Notes (Signed)
[facility] EMERGENCY DEPARTMENT Provider Note   CSN: [id number] Arrival date & time: 06/11/20  2044    History Chief Complaint  Patient presents with   Knee Injury/Laceration     Cassandra [name] is a 30 y.o. female with past medical history who presents for evaluation after fall.  Mechanical fall earlier today where she fell directly onto her right patella.  Suffered approximately 1.5 cm laceration to central knee.  Denies any head, LOC or anticoagulation.  She has unknown last tetanus.  Laceration on the right joint line.  No active bleeding or drainage.  Has been ambulatory with out difficulty.  No fever, chills, nausea, vomiting, paresthesias, weakness.  She has been able to ambulate without difficulty.Pain 0/10.  Denies additional aggravating or alleviating factors.  History obtained from patient and past medical records.  No interpreter used  HPI     Past Medical History:  Diagnosis Date   Ovarian cyst    symptomatic   Wears glasses     Patient Active Problem List   Diagnosis Date Noted   Adnexal mass 07/26/2018   Contraception management 07/26/2013    Past Surgical History:  Procedure Laterality Date   LAPAROSCOPIC OVARIAN CYSTECTOMY Right 08/08/2019   Procedure: LAPAROSCOPIC OVARIAN CYSTECTOMY AND RIGHT OOPHORECTOMY;  Surgeon: Osborne Oman, MD;  Location: [facility];  Service: Gynecology;  Laterality: Right;   WISDOM TOOTH EXTRACTION  2008     OB History    Gravida  3   Para  3   Term  3   Preterm  0   AB  0   Living  3     SAB  0   IAB  0   Ectopic  0   Multiple  0   Live Births  3        Obstetric Comments  Induced for postdates.         Family History  Problem Relation Age of Onset   Asthma Mother    Diabetes Mother    Asthma Sister    Asthma Brother    Cancer Maternal Uncle    Hypertension Paternal Grandfather     Social History   Tobacco Use   Smoking status:  Never Smoker   Smokeless tobacco: Never Used  Scientific laboratory technician Use: Never used  Substance Use Topics   Alcohol use: No   Drug use: Never    Home Medications Prior to Admission medications   Medication Sig Start Date End Date Taking? Authorizing Provider  acetaminophen (TYLENOL) 325 MG tablet Take 2 tablets (650 mg total) by mouth every 4 (four) hours as needed (for pain scale < 4). 02/26/19   Gifford Shave, MD  ibuprofen (ADVIL) 600 MG tablet Take 1 tablet (600 mg total) by mouth every 6 (six) hours as needed for headache, mild pain, moderate pain or cramping. 08/08/19   Anyanwu, Sallyanne Havers, MD  Prenatal Vit-Fe Fumarate-FA (PRENATAL MULTIVITAMIN) TABS tablet Take 1 tablet by mouth daily. 01/29/19   Martyn Malay, MD    Allergies    Patient has no known allergies.  Review of Systems   Review of Systems  Constitutional: Negative.   HENT: Negative.   Respiratory: Negative.   Cardiovascular: Negative.   Gastrointestinal: Negative.   Genitourinary: Negative.   Musculoskeletal: Negative for back pain and gait problem.       Right knee lac  Skin: Positive for wound.  Neurological: Negative.   All other systems  reviewed and are negative.   Physical Exam Updated Vital Signs BP 123/83 (BP Location: Right Arm)    Pulse 75    Temp 98.1 F (36.7 C) (Oral)    Resp 16    Ht 5\' 4"  (1.626 m)    Wt 92 kg    LMP 05/21/2020    SpO2 98%    BMI 34.81 kg/m   Physical Exam Vitals and nursing note reviewed.  Constitutional:      General: She is not in acute distress.    Appearance: She is well-developed and well-nourished. She is not ill-appearing, toxic-appearing or diaphoretic.  HENT:     Head: Normocephalic and atraumatic.     Nose: Nose normal.     Mouth/Throat:     Mouth: Mucous membranes are moist.  Eyes:     Pupils: Pupils are equal, round, and reactive to light.  Cardiovascular:     Rate and Rhythm: Normal rate.     Pulses: Normal pulses and intact distal pulses.   Pulmonary:     Effort: No respiratory distress.  Abdominal:     General: There is no distension.  Musculoskeletal:        General: No swelling, tenderness, deformity or signs of injury. Normal range of motion.     Cervical back: Normal range of motion.     Right lower leg: No edema.     Left lower leg: No edema.     Comments: Full range of motion bilateral lower extremities at difficulty. Able to straight leg raise bilaterally.  No bony tenderness to tib-fib, femur.  Pelvis stable, nontender palpation.   Skin:    General: Skin is warm and dry.     Capillary Refill: Capillary refill takes less than 2 seconds.     Comments: Approximately 1.5 cm laceration, superior mid patella.  Laceration is not over a joint space.  No active bleeding or drainage.  No foreign body.  Neurological:     General: No focal deficit present.     Mental Status: She is alert and oriented to person, place, and time.     Comments: Intact sensation Ambulatory with out difficulty.  Psychiatric:        Mood and Affect: Mood and affect normal.    ED Results / Procedures / Treatments   Labs (all labs ordered are listed, but only abnormal results are displayed) Labs Reviewed - No data to display  EKG None  Radiology DG Knee Complete 4 Views Right  Result Date: 06/11/2020 CLINICAL DATA:  30 year old female with fall and trauma to the right knee. EXAM: RIGHT KNEE - COMPLETE 4+ VIEW COMPARISON:  None. FINDINGS: There is no acute fracture or dislocation. The bones are well mineralized. No arthritic changes. No joint effusion. Laceration of the skin anterior to the patella. No radiopaque foreign object or soft tissue gas. IMPRESSION: No acute fracture or dislocation. Electronically Signed   By: Anner Crete M.D.   On: 06/11/2020 21:50    Procedures .Marland KitchenLaceration Repair  Date/Time: 06/12/2020 12:18 AM Performed by: Nettie Elm, PA-C Authorized by: Nettie Elm, PA-C   Consent:    Consent  obtained:  Verbal   Consent given by:  Patient   Risks discussed:  Infection, need for additional repair, pain, poor cosmetic result and poor wound healing   Alternatives discussed:  No treatment and delayed treatment Universal protocol:    Procedure explained and questions answered to patient or proxy's satisfaction: yes     Relevant documents  present and verified: yes     Test results available: yes     Imaging studies available: yes     Required blood products, implants, devices, and special equipment available: yes     Site/side marked: yes     Immediately prior to procedure, a time out was called: yes     Patient identity confirmed:  Verbally with patient Anesthesia:    Anesthesia method:  Topical application   Topical anesthesia: [place]. Laceration details:    Location:  Leg   Leg location:  R knee   Length (cm):  1.5   Depth (mm):  3 Pre-procedure details:    Preparation:  Patient was prepped and draped in usual sterile fashion and imaging obtained to evaluate for foreign bodies Exploration:    Hemostasis achieved with:  Direct pressure   Imaging obtained: x-ray     Imaging outcome: foreign body not noted     Wound exploration: wound explored through full range of motion and entire depth of wound visualized     Wound extent: no muscle damage noted, no tendon damage noted and no underlying fracture noted     Contaminated: no   Treatment:    Area cleansed with:  Povidone-iodine   Amount of cleaning:  Extensive   Irrigation solution:  Sterile saline   Irrigation method:  Pressure wash   Debridement:  None Skin repair:    Repair method:  Staples   Number of staples:  1 Approximation:    Approximation:  Close Repair type:    Repair type:  Intermediate Post-procedure details:    Dressing:  Non-adherent dressing   Procedure completion:  Tolerated well, no immediate complications     Medications Ordered in ED Medications  Tdap (BOOSTRIX) injection 0.5 mL (0.5 mLs  Intramuscular Given 06/11/20 2341)    ED Course  I have reviewed the triage vital signs and the nursing notes.  Pertinent labs & imaging results that were available during my care of the patient were reviewed by me and considered in my medical decision making (see chart for details).   30 year old here after mechanical fall.  Has been ambulatory since.  She has 1.5 cm laceration to mid right patella.  She has a nonfocal neuro exam without deficits.  Normal musculoskeletal exam. NV intact. Unsure last tetanus, will update.  Laceration is not over a joint space.  X-ray obtained from triage which I personally reviewed and interpreted did not show evidence of fracture, dislocation or effusion.  Low suspicion for occult fracture, tendon, muscle or ligament injury. Low suspicion for retained foreign body.  Wound thoroughly irrigated.  See note. #1 Staple placed.  She will follow-up with PCP for evaluation and suture removal.  The patient has been appropriately medically screened and/or stabilized in the ED. I have low suspicion for any other emergent medical condition which would require further screening, evaluation or treatment in the ED or require inpatient management.  Patient is hemodynamically stable and in no acute distress.  Patient able to ambulate in department prior to ED.  Evaluation does not show acute pathology that would require ongoing or additional emergent interventions while in the emergency department or further inpatient treatment.  I have discussed the diagnosis with the patient and answered all questions.  Pain is been managed while in the emergency department and patient has no further complaints prior to discharge.  Patient is comfortable with plan discussed in room and is stable for discharge at this time.  I have discussed  strict return precautions for returning to the emergency department.  Patient was encouraged to follow-up with PCP/specialist refer to at discharge.    MDM  Rules/Calculators/A&P                           Final Clinical Impression(s) / ED Diagnoses Final diagnoses:  Fall, initial encounter  Laceration of right knee, initial encounter    Rx / DC Orders ED Discharge Orders    None       Gwendoline Judy A, PA-C 06/12/20 0022    Orpah Greek, MD 06/12/20 [phone]

## 2020-06-11 NOTE — ED Triage Notes (Signed)
Patient ripped and fell this evening reports pain at right knee with skin laceration approx. 11/2" dressing applied prior to arrival/no bleeding .

## 2020-06-12 ENCOUNTER — Emergency Department (HOSPITAL_COMMUNITY)
Admission: EM | Admit: 2020-06-12 | Discharge: 2020-06-12 | Disposition: A | Discharge status: Home / Self Care | Payer: Medicaid Other | Attending: Emergency Medicine | Admitting: Emergency Medicine

## 2020-06-12 ENCOUNTER — Other Ambulatory Visit: Payer: Self-pay

## 2020-06-12 ENCOUNTER — Encounter (HOSPITAL_COMMUNITY): Payer: Self-pay

## 2020-06-12 DIAGNOSIS — Y828 Other medical devices associated with adverse incidents: Secondary | ICD-10-CM | POA: Diagnosis not present

## 2020-06-12 DIAGNOSIS — W1839XA Other fall on same level, initial encounter: Secondary | ICD-10-CM | POA: Diagnosis not present

## 2020-06-12 DIAGNOSIS — T8130XA Disruption of wound, unspecified, initial encounter: Secondary | ICD-10-CM | POA: Diagnosis not present

## 2020-06-12 DIAGNOSIS — T148XXA Other injury of unspecified body region, initial encounter: Secondary | ICD-10-CM

## 2020-06-12 DIAGNOSIS — Z4802 Encounter for removal of sutures: Secondary | ICD-10-CM | POA: Diagnosis not present

## 2020-06-12 DIAGNOSIS — S81001D Unspecified open wound, right knee, subsequent encounter: Secondary | ICD-10-CM | POA: Diagnosis not present

## 2020-06-12 DIAGNOSIS — Z5189 Encounter for other specified aftercare: Secondary | ICD-10-CM

## 2020-06-12 NOTE — Discharge Instructions (Signed)
Wash with warm soapy water.  Follow up with PCP in 1 week for removal  Return for new or worsening symptoms such as redness, warmth, yellow drainage.

## 2020-06-12 NOTE — ED Provider Notes (Signed)
[facility] DEPT Provider Note   CSN: [id number] Arrival date & time: 06/12/20  2151     History Wound check   Cassandra [name] is a 30 y.o. female recent staple placed yesterday to right anterior knee after mechanical fall.  I actually assessed her yesterday.  1 staples placed.  Patient states she bent her knee and felt like it pulled.  She feels like it is lifted out of her skin.  She has not noted any redness, swelling, warmth, drainage to wound.  No fevers or chills.  Has no pain.  Denies additional aggravating or alleviating factors  History obtained from patient and past medical records.  No interpreter used.  HPI     Past Medical History:  Diagnosis Date  . Ovarian cyst    symptomatic  . Wears glasses     Patient Active Problem List   Diagnosis Date Noted  . Adnexal mass 07/26/2018  . Contraception management 07/26/2013    Past Surgical History:  Procedure Laterality Date  . LAPAROSCOPIC OVARIAN CYSTECTOMY Right 08/08/2019   Procedure: LAPAROSCOPIC OVARIAN CYSTECTOMY AND RIGHT OOPHORECTOMY;  Surgeon: Osborne Oman, MD;  Location: [facility];  Service: Gynecology;  Laterality: Right;  . WISDOM TOOTH EXTRACTION  2008     OB History    Gravida  3   Para  3   Term  3   Preterm  0   AB  0   Living  3     SAB  0   IAB  0   Ectopic  0   Multiple  0   Live Births  3        Obstetric Comments  Induced for postdates.         Family History  Problem Relation Age of Onset  . Asthma Mother   . Diabetes Mother   . Asthma Sister   . Asthma Brother   . Cancer Maternal Uncle   . Hypertension Paternal Grandfather     Social History   Tobacco Use  . Smoking status: Never Smoker  . Smokeless tobacco: Never Used  Vaping Use  . Vaping Use: Never used  Substance Use Topics  . Alcohol use: No  . Drug use: Never    Home Medications Prior to Admission medications   Medication Sig Start  Date End Date Taking? Authorizing Provider  acetaminophen (TYLENOL) 325 MG tablet Take 2 tablets (650 mg total) by mouth every 4 (four) hours as needed (for pain scale < 4). 02/26/19   Gifford Shave, MD  ibuprofen (ADVIL) 600 MG tablet Take 1 tablet (600 mg total) by mouth every 6 (six) hours as needed for headache, mild pain, moderate pain or cramping. 08/08/19   Anyanwu, Sallyanne Havers, MD  Prenatal Vit-Fe Fumarate-FA (PRENATAL MULTIVITAMIN) TABS tablet Take 1 tablet by mouth daily. 01/29/19   Martyn Malay, MD    Allergies    Patient has no known allergies.  Review of Systems   Review of Systems  Constitutional: Negative.   HENT: Negative.   Respiratory: Negative.   Cardiovascular: Negative.   Gastrointestinal: Negative.   Genitourinary: Negative.   Musculoskeletal: Negative.   Skin: Positive for wound.  Neurological: Negative.   All other systems reviewed and are negative.   Physical Exam Updated Vital Signs BP 113/86 (BP Location: Left Arm)   Pulse 92   Temp 98.2 F (36.8 C) (Oral)   Resp 16   LMP 05/21/2020   SpO2 99%  Physical Exam Vitals and nursing note reviewed.  Constitutional:      General: She is not in acute distress.    Appearance: She is well-developed and well-nourished. She is not ill-appearing, toxic-appearing or diaphoretic.  HENT:     Head: Atraumatic.  Eyes:     Pupils: Pupils are equal, round, and reactive to light.  Cardiovascular:     Rate and Rhythm: Normal rate.     Pulses: Intact distal pulses.  Pulmonary:     Effort: No respiratory distress.  Abdominal:     General: There is no distension.  Musculoskeletal:        General: Normal range of motion.     Cervical back: Normal range of motion.     Comments: Moves all 4 extremities without difficulty.  No bony tenderness  Skin:    General: Skin is warm and dry.     Comments: No erythema, warmth or drainage.  1.5, well-healing, approximated laceration to right anterior knee.  1 suture with  some mild superior displacement of portion of stable.  Neurological:     General: No focal deficit present.     Mental Status: She is alert.     Comments: Intact sensation Ambulatory without difficulty  Psychiatric:        Mood and Affect: Mood and affect normal.     ED Results / Procedures / Treatments   Labs (all labs ordered are listed, but only abnormal results are displayed) Labs Reviewed - No data to display  EKG None  Radiology DG Knee Complete 4 Views Right  Result Date: 06/11/2020 CLINICAL DATA:  30 year old female with fall and trauma to the right knee. EXAM: RIGHT KNEE - COMPLETE 4+ VIEW COMPARISON:  None. FINDINGS: There is no acute fracture or dislocation. The bones are well mineralized. No arthritic changes. No joint effusion. Laceration of the skin anterior to the patella. No radiopaque foreign object or soft tissue gas. IMPRESSION: No acute fracture or dislocation. Electronically Signed   By: Anner Crete M.D.   On: 06/11/2020 21:50    Procedures .Marland KitchenLaceration Repair  Date/Time: 06/12/2020 10:50 PM Performed by: Nettie Elm, PA-C Authorized by: Nettie Elm, PA-C   Consent:    Consent obtained:  Verbal   Consent given by:  Patient   Risks discussed:  Infection, need for additional repair, pain, poor cosmetic result and poor wound healing   Alternatives discussed:  No treatment and delayed treatment Universal protocol:    Procedure explained and questions answered to patient or proxy's satisfaction: yes     Relevant documents present and verified: yes     Test results available: yes     Imaging studies available: yes     Required blood products, implants, devices, and special equipment available: yes     Site/side marked: yes     Immediately prior to procedure, a time out was called: yes     Patient identity confirmed:  Verbally with patient Anesthesia:    Anesthesia method:  Local infiltration Exploration:    Hemostasis achieved with:   Direct pressure Treatment:    Area cleansed with:  Povidone-iodine   Amount of cleaning:  Standard Skin repair:    Repair method:  Staples   Number of staples:  1 Approximation:    Approximation:  Close Repair type:    Repair type:  Simple Post-procedure details:    Dressing:  Non-adherent dressing   Procedure completion:  Tolerated well, no immediate complications     Medications  Ordered in ED Medications - No data to display  ED Course  I have reviewed the triage vital signs and the nursing notes.  Pertinent labs & imaging results that were available during my care of the patient were reviewed by me and considered in my medical decision making (see chart for details).  Patient here for wound recheck.  Actually saw patient yesterday and placed 1 suture to her right anterior knee laceration.  There is concerned that she bent her knee and dislodged her staple.  Wound appears to be well-healing.  No active bleeding or drainage.  Does not appear actively infected.  She has neuromusculoskeletal exam is neurovascularly intact.  There is a little bit of some looseness to the superior aspect of the staple.  I did place a second staple today given patient is concerned she goes back to work tomorrow with further pulling at the wound.  No active wound dehiscence discussed keeping knee in extension over next 48 hours, return for any worsening symptoms is agreeable for this.  The patient has been appropriately medically screened and/or stabilized in the ED. I have low suspicion for any other emergent medical condition which would require further screening, evaluation or treatment in the ED or require inpatient management.  Patient is hemodynamically stable and in no acute distress.  Patient able to ambulate in department prior to ED.  Evaluation does not show acute pathology that would require ongoing or additional emergent interventions while in the emergency department or further inpatient  treatment.  I have discussed the diagnosis with the patient and answered all questions.  Pain is been managed while in the emergency department and patient has no further complaints prior to discharge.  Patient is comfortable with plan discussed in room and is stable for discharge at this time.  I have discussed strict return precautions for returning to the emergency department.  Patient was encouraged to follow-up with PCP/specialist refer to at discharge.    MDM Rules/Calculators/A&P                           Final Clinical Impression(s) / ED Diagnoses Final diagnoses:  Visit for wound check  Stapled skin wound    Rx / DC Orders ED Discharge Orders    None       Henderly, Britni A, PA-C 06/12/20 2252    Gareth Morgan, MD 06/16/20 [phone]

## 2020-06-12 NOTE — Discharge Instructions (Addendum)
Staple needs to be removed in 1 week  Try to keep leg straight for next 24 hours  Return for new or worsening symptoms

## 2020-06-12 NOTE — ED Triage Notes (Signed)
Pt had a staple put in her knee yesterday and the staple is coming out

## 2020-06-13 ENCOUNTER — Telehealth: Payer: Self-pay

## 2020-06-13 NOTE — Telephone Encounter (Signed)
Transition Care Management Unsuccessful Follow-up Telephone Call  Date of discharge and from where:  06/12/2020 from [facility]  Attempts:  1st Attempt  Reason for unsuccessful TCM follow-up call:  Left voice message

## 2020-06-14 NOTE — Telephone Encounter (Signed)
Transition Care Management Follow-up Telephone Call  Date of discharge and from where: 06/12/2020 from [place]  How have you been since you were released from the hospital? Pt states that she is doing well.   Any questions or concerns? No  Items Reviewed:  Did the pt receive and understand the discharge instructions provided? Yes   Medications obtained and verified? No   Other? No   Any new allergies since your discharge? No   Dietary orders reviewed? n/a  Do you have support at home? Yes   Functional Questionnaire: (I = Independent and D = Dependent) ADLs: I  Bathing/Dressing- I  Meal Prep- I  Eating- I  Maintaining continence- I  Transferring/Ambulation- I  Managing Meds- I   Follow up appointments reviewed:   PCP Hospital f/u appt confirmed? No  Pt will call PCP for follow up and to have the staples removed.   Are transportation arrangements needed? No   If their condition worsens, is the pt aware to call PCP or go to the Emergency Dept.? Yes  Was the patient provided with contact information for the PCP's office or ED? Yes  Was to pt encouraged to call back with questions or concerns? Yes

## 2020-06-21 ENCOUNTER — Ambulatory Visit: Payer: Medicaid Other

## 2020-06-21 ENCOUNTER — Other Ambulatory Visit: Payer: Self-pay

## 2020-06-21 DIAGNOSIS — Z4802 Encounter for removal of sutures: Secondary | ICD-10-CM

## 2020-06-21 NOTE — Progress Notes (Signed)
Patient present in nurse clinic for staple removal. Patient had two staples applied to her right knee on 02/23 by ED.  Staples were removed with the help of Dr. Jeannine Kitten.  The wound looked clean, no signs of infection.  Return precautions given to patient.

## 2020-12-29 IMAGING — US OBSTETRIC <14 WK US AND TRANSVAGINAL OB US
2 series · 15 of 28 positions shown · non-contrast
Comparison: None.

CLINICAL DATA: Right lower quadrant abdominal pain, 7 weeks
gestation.

EXAM:
OBSTETRIC <14 WK US AND TRANSVAGINAL OB US
TECHNIQUE: Both transabdominal and transvaginal ultrasound examinations were
performed for complete evaluation of the gestation as well as the
maternal uterus, adnexal regions, and pelvic cul-de-sac.
Transvaginal technique was performed to assess early pregnancy.

[Series 1: obstetric <14 wk us and transvaginal ob us · 14 of 57 slices shown (1 of 2)]
[im 1/57]
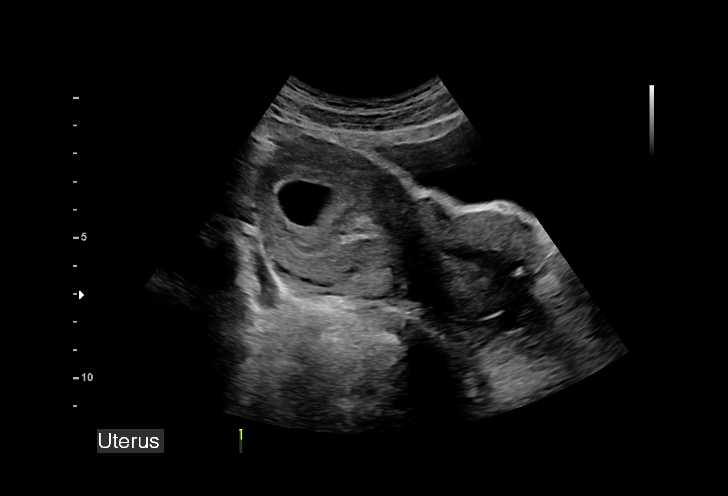
[im 5/57]
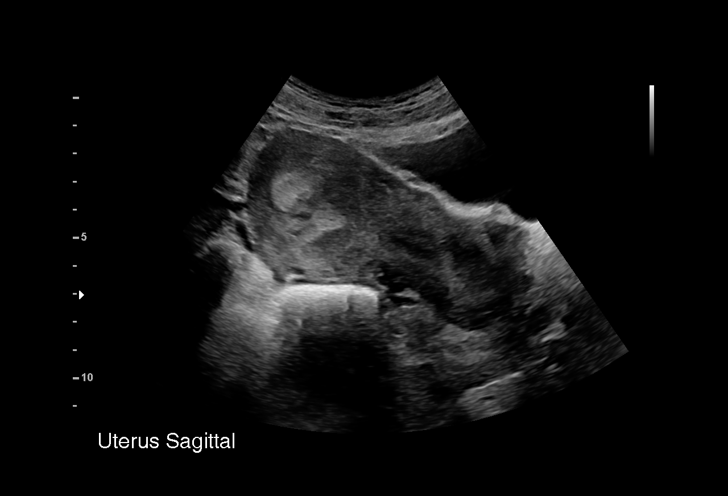
[im 9/57]
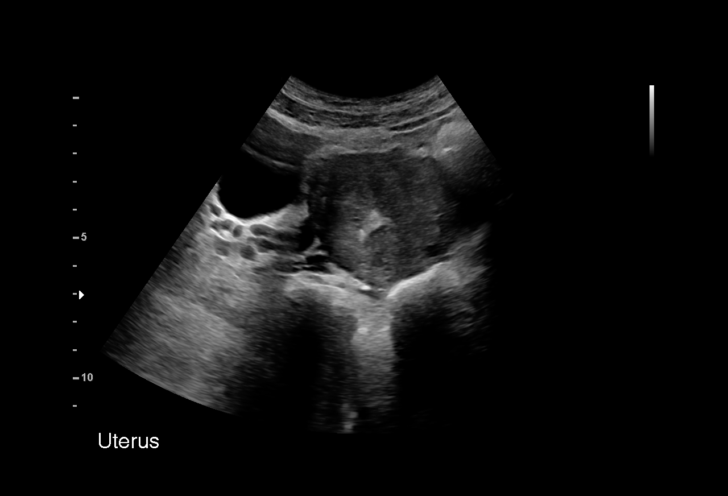
[im 13/57]
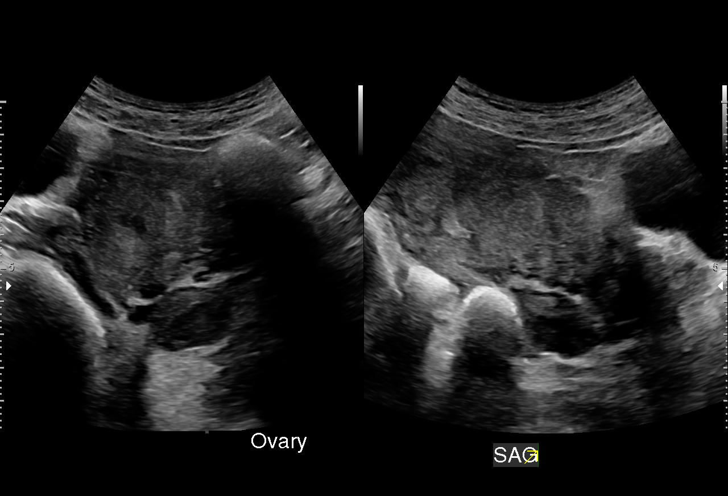
[im 18/57]
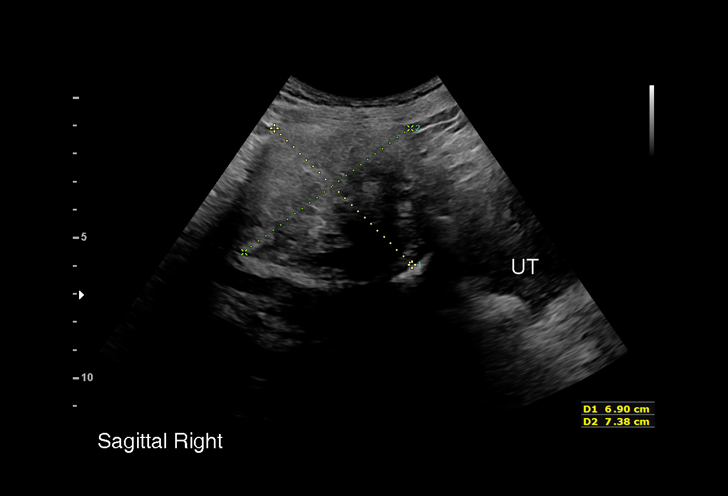
[im 22/57]
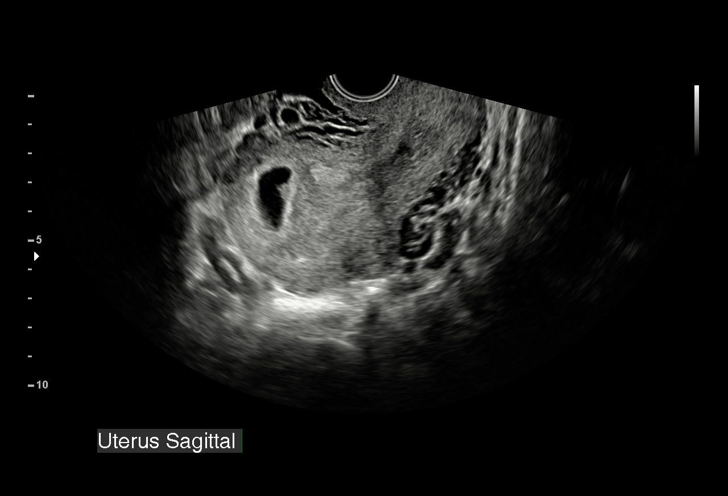
[im 26/57]
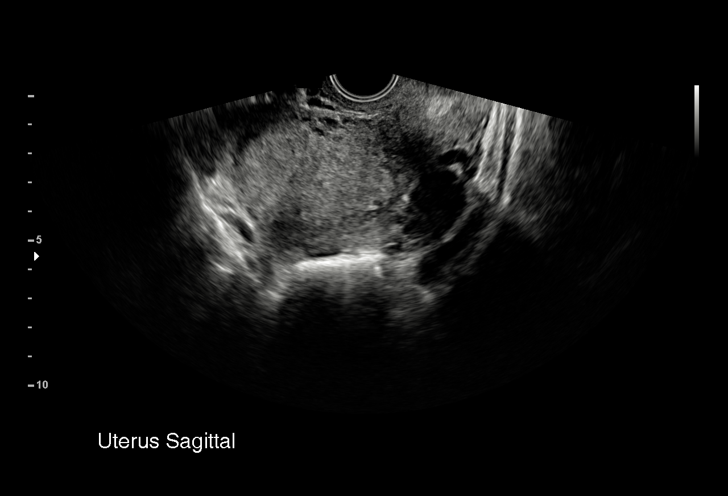
[im 31/57]
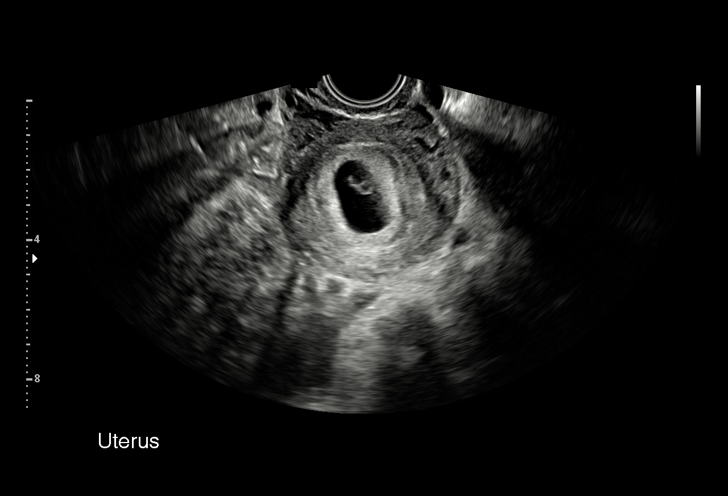
[im 33/57]
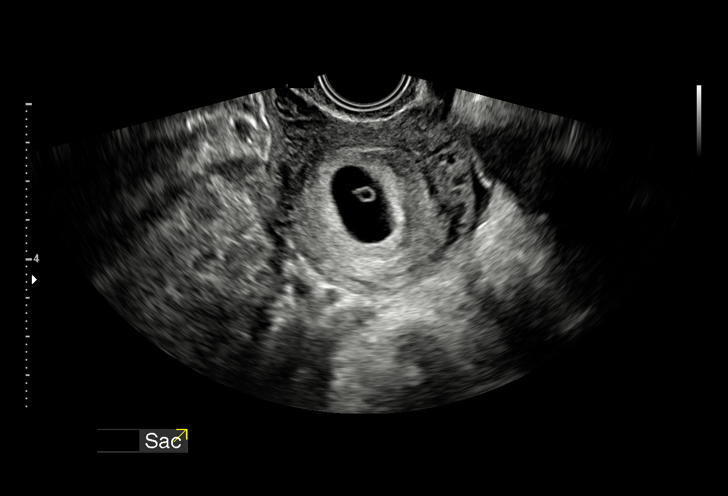
[im 37/57]
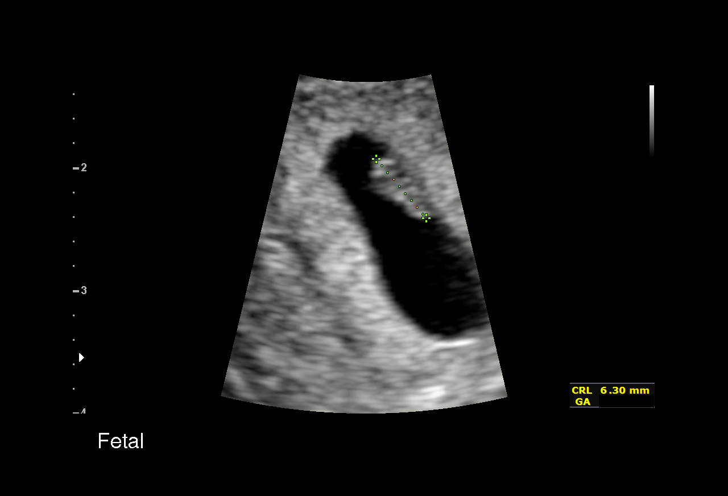
[im 41/57]
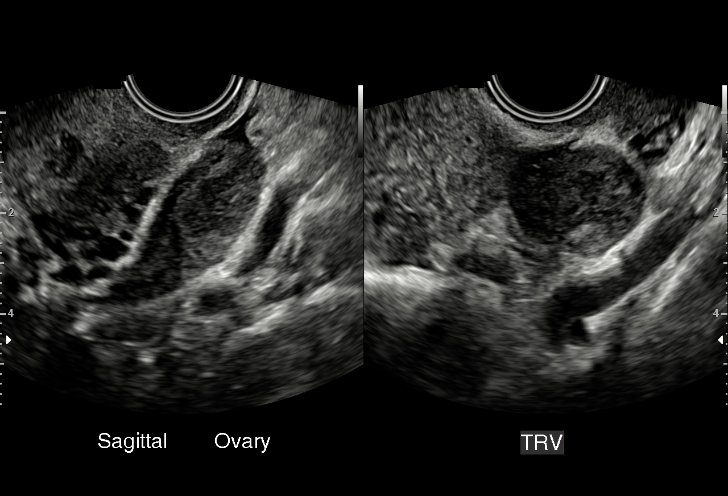
[im 46/57]
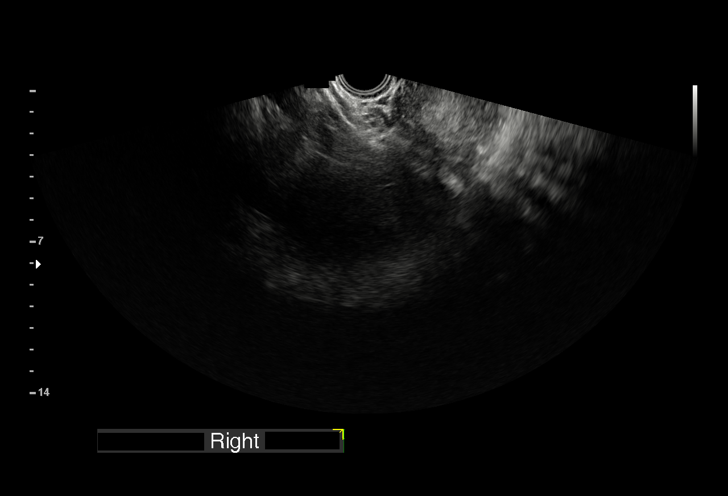
[im 50/57]
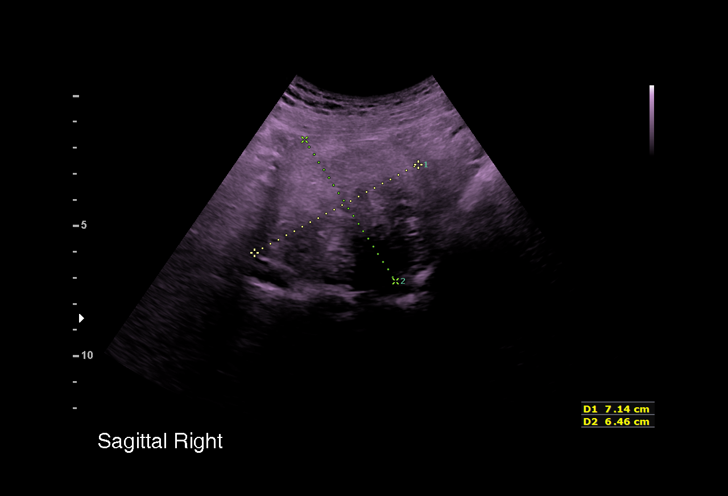
[im 54/57]
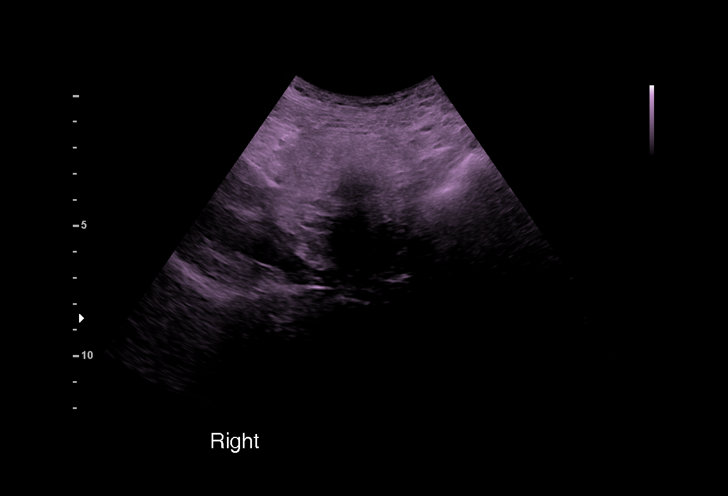

[Series 2: obstetric <14 wk us and transvaginal ob us · 1 of 1 slices shown (2 of 2)]
[im 1/1]
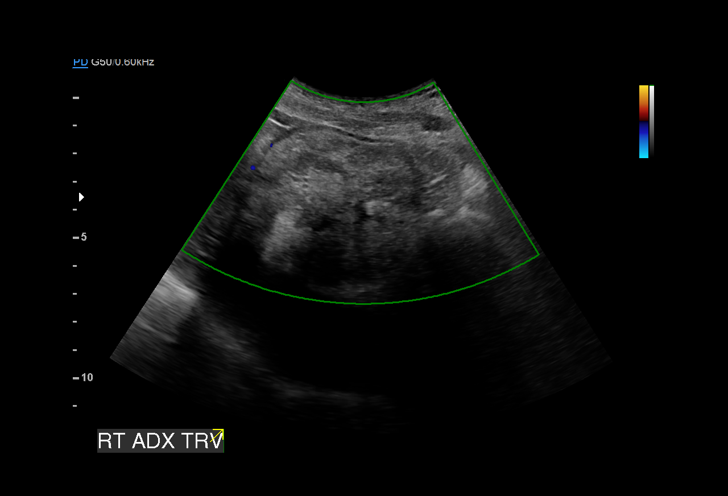

[15 of 28 positions shown; findings below may reference images not displayed]

FINDINGS: Intrauterine gestational sac: Single

Yolk sac:  Yes

Embryo:  Yes

Cardiac Activity: Yes

Heart Rate: 120 bpm

CRL:  6.4 mm   6 w   3 d                  US EDC: 02/18/19

Subchorionic hemorrhage:  None visualized.

Maternal uterus/adnexae:

Subchorionic hemorrhage: None

Right ovary: Not visualized

Left ovary: Appears normal.

Other :Within the right adnexa there is a large, hypovascular
solid-appearing mass which measures 7.1 x 6.5 x 6.1 cm.

Free fluid:  None
IMPRESSION: 1. Single living intrauterine gestation is identified with an
estimated gestational age of 6 weeks and 3 days.
2. Large, solid-appearing hypovascular mass within the right adnexa
is identified measuring up to 7.1 cm. The primary differential
considerations include large pedunculated fibroid versus ovarian
neoplasm (benign or malignant). Consider further evaluation with
noncontrast enhanced MRI of the pelvis. Gynecologic consultation and
close ultrasound follow-up is recommended.

## 2021-06-07 IMAGING — US US PELVIS COMPLETE
1 series · 15 of 20 positions shown · non-contrast
Comparison: November 22, 2018

CLINICAL DATA: Increasing pain.

EXAM:
TRANSABDOMINAL ULTRASOUND OF PELVIS
DOPPLER ULTRASOUND OF OVARIES
TECHNIQUE: Transabdominal ultrasound examination of the pelvis was performed
including evaluation of the uterus, ovaries, adnexal regions, and
pelvic cul-de-sac.
Color and duplex Doppler ultrasound was utilized to evaluate blood
flow to the ovaries.

[Series 1: us pelvis complete · 15 of 20 slices shown]
[im 1/20]
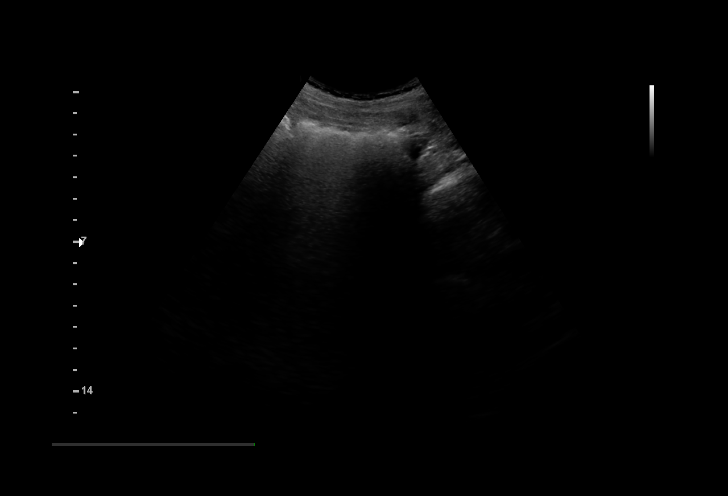
[im 3/20]
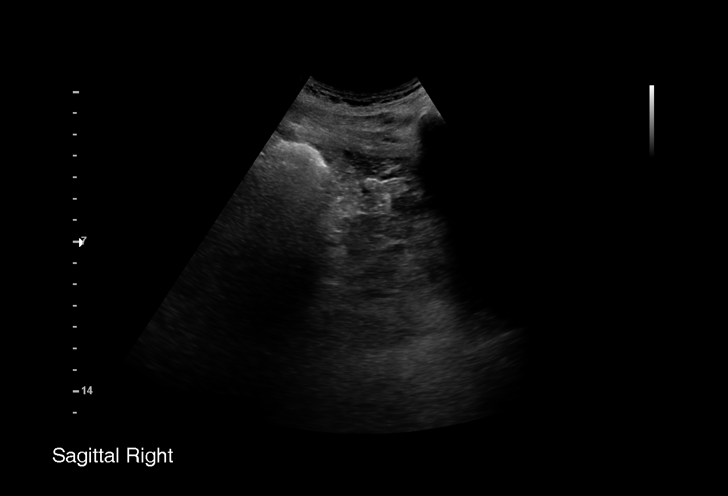
[im 4/20]
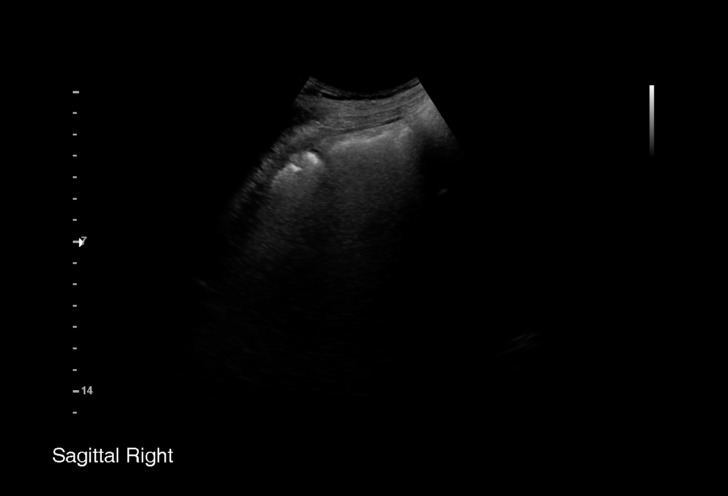
[im 5/20]
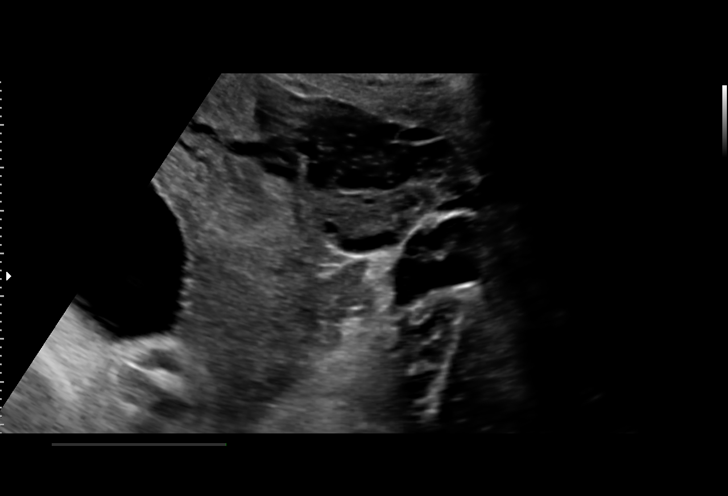
[im 7/20]
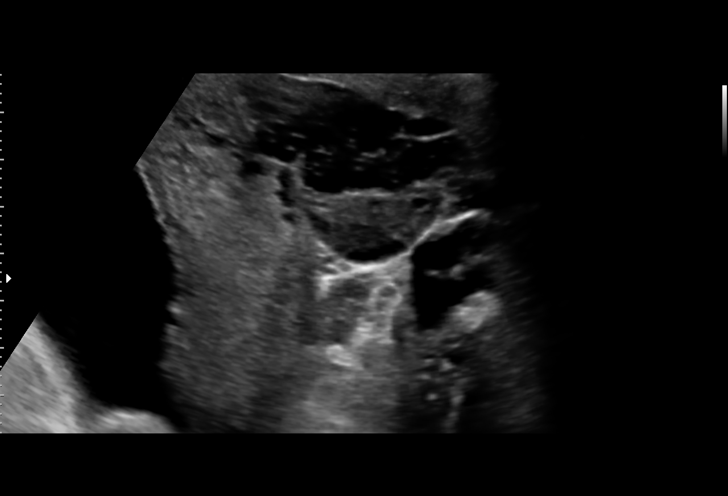
[im 8/20]
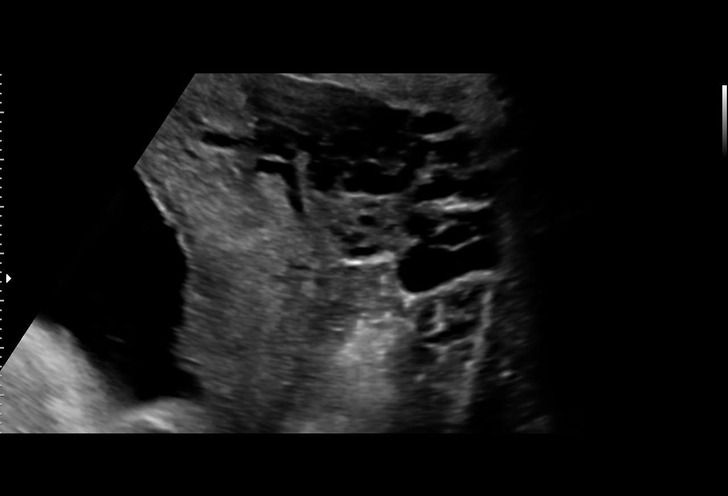
[im 9/20]
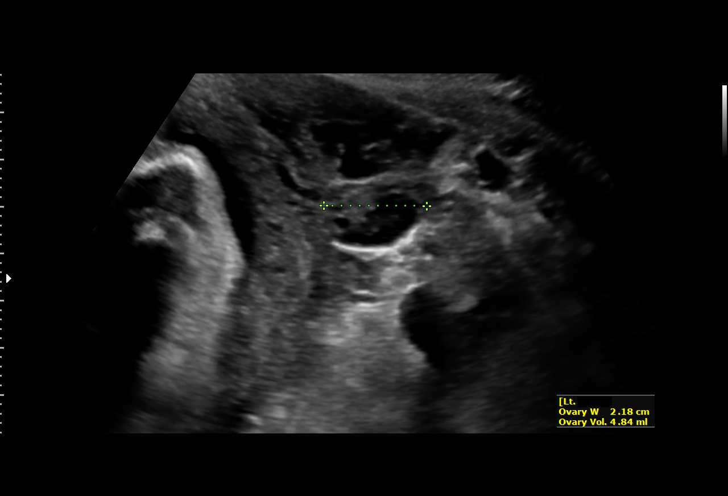
[im 11/20]
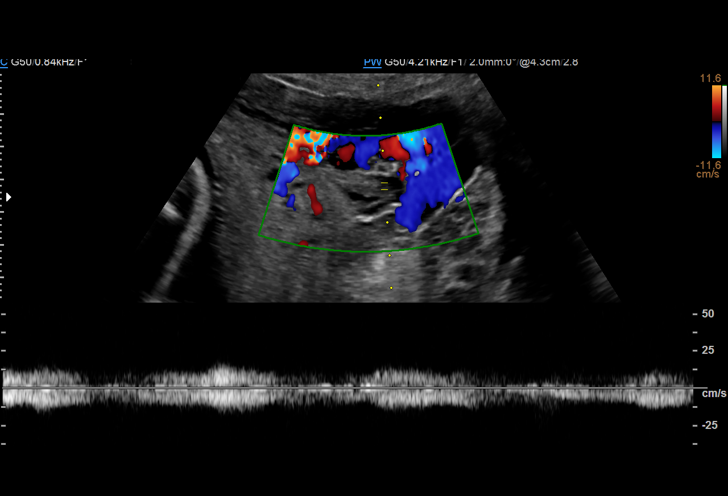
[im 12/20]
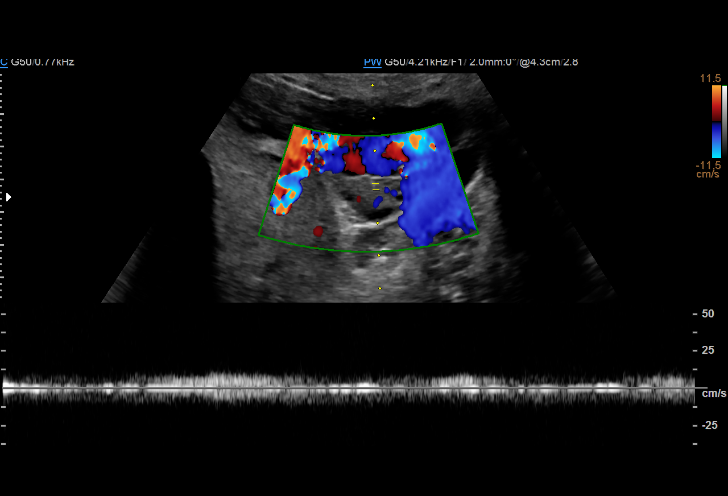
[im 13/20]
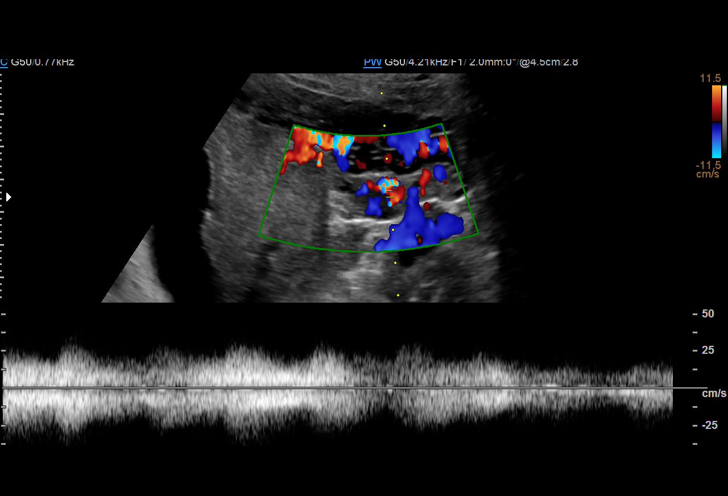
[im 15/20]
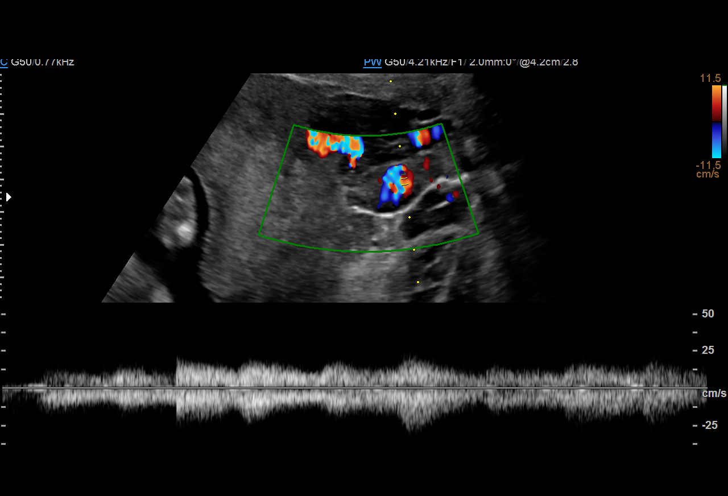
[im 16/20]
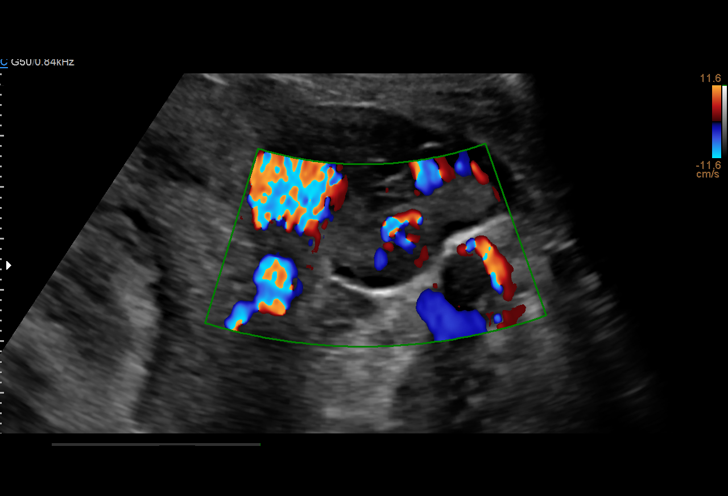
[im 17/20]
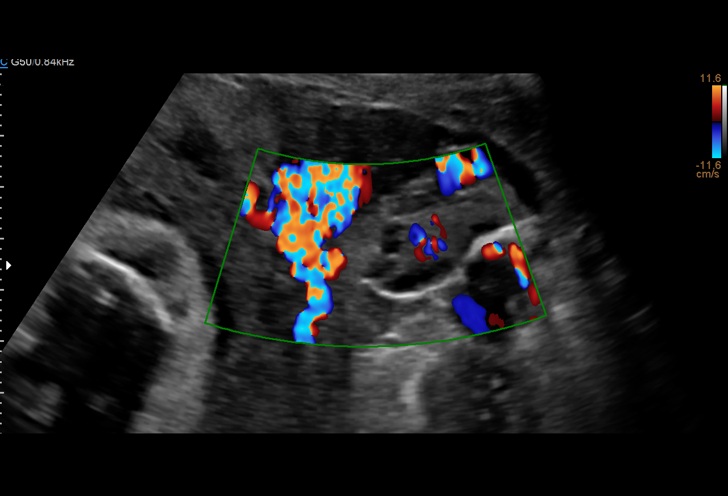
[im 19/20]
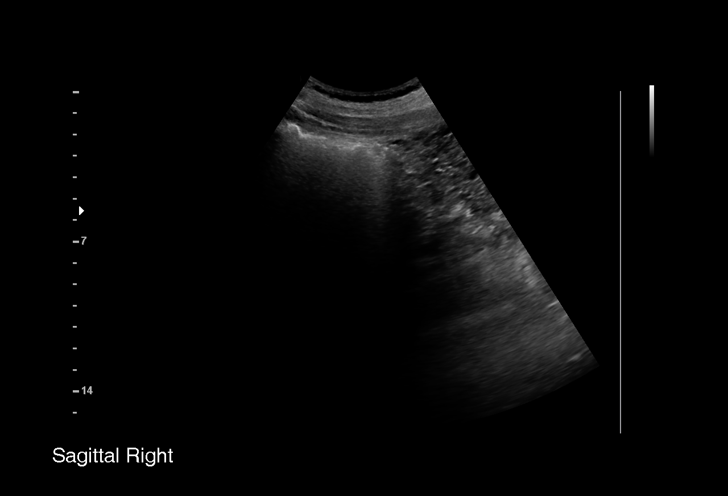
[im 20/20]
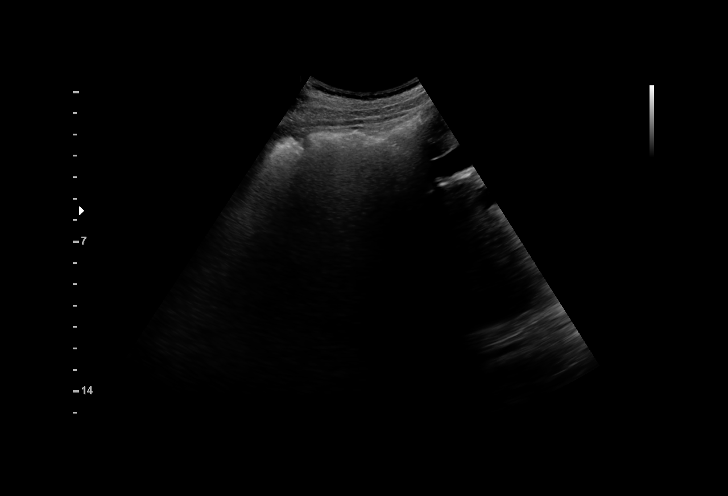

[15 of 20 positions shown; findings below may reference images not displayed]

FINDINGS: Uterus

There is a gravid uterus consistent with the patient's history of

Right ovary

The right ovary was not visualized.

Left ovary

Measurements: 2.9 x 1.5 x 2.2 cm = volume: 4.8 mL. Normal
appearance/no adnexal mass.

Pulsed Doppler evaluation demonstrates normal low-resistance
arterial and venous waveforms in the left ovary.

Other: None
IMPRESSION: 1. The right ovary was not visualized secondary to overlying bowel
gas.
2. Unremarkable appearance of the left ovary.
3. Gravid uterus.

## 2021-07-27 IMAGING — US US MFM OB FOLLOW-UP
1 series · 13 of 28 positions shown · non-contrast
Comparison: none

[Series 1: us mfm ob follow-up · 61 acquisitions, 13 frames shown]
[im 3/61]
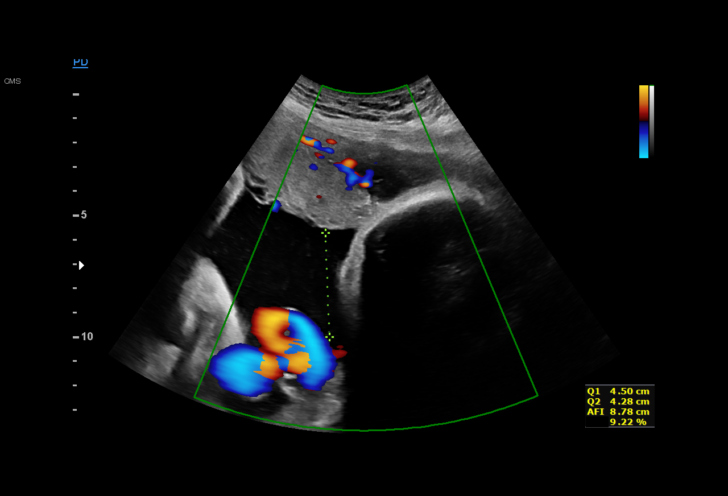
[im 7/61]
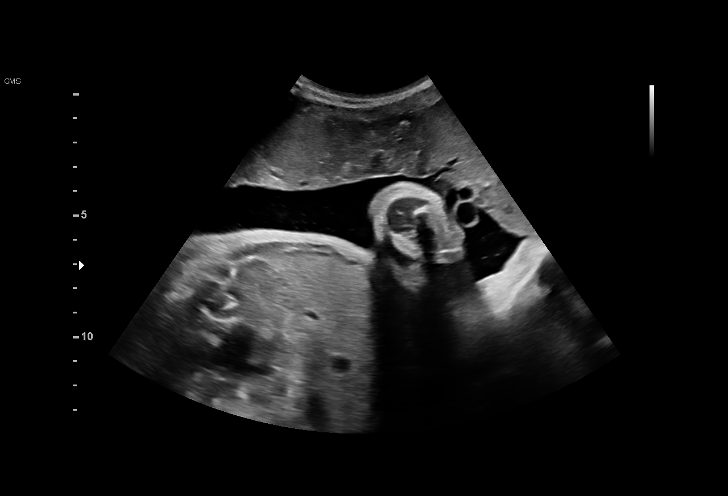
[im 12/61]
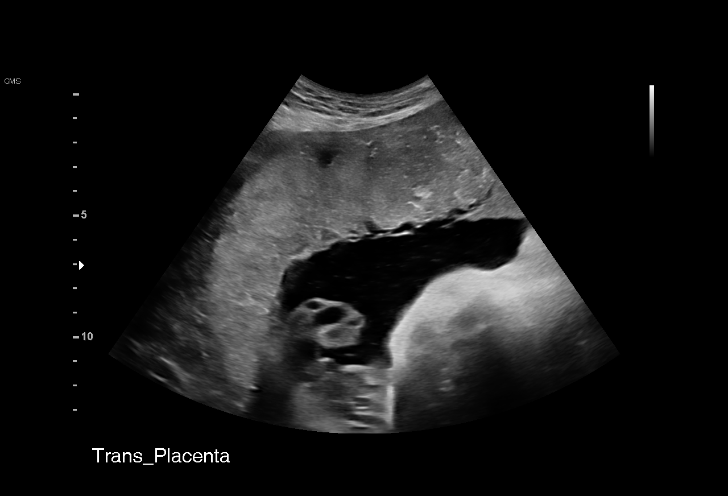
[im 16/61]
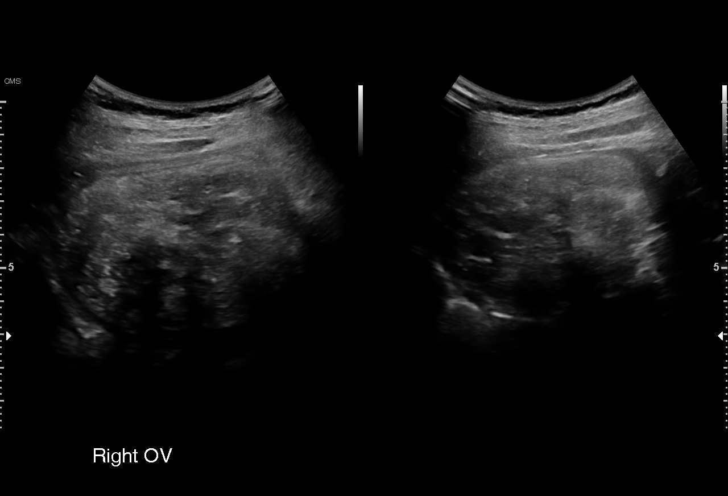
[im 21/61]
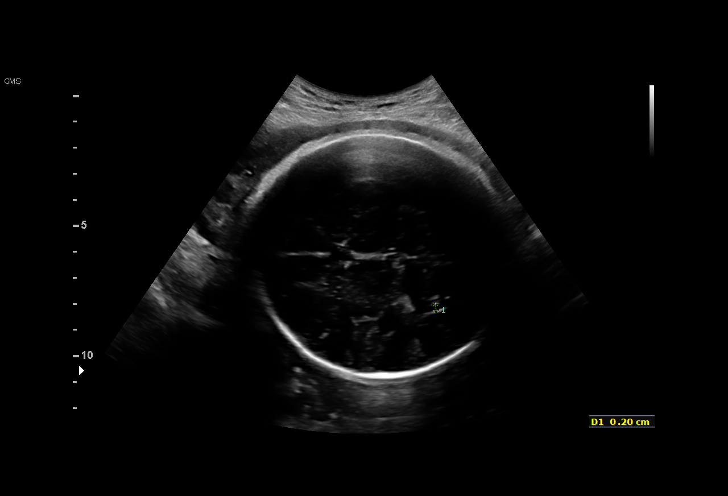
[im 25/61]
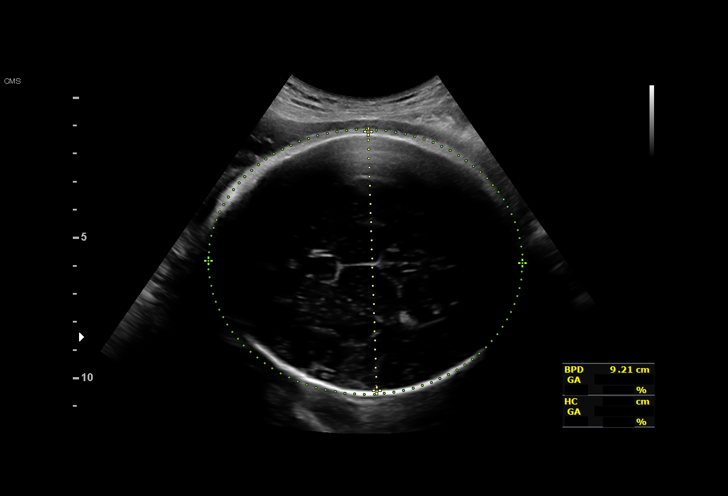
[im 32/61]
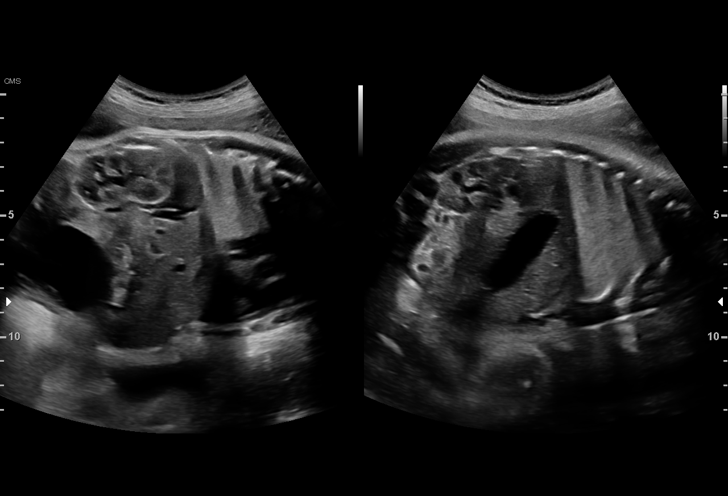
[im 36/61]
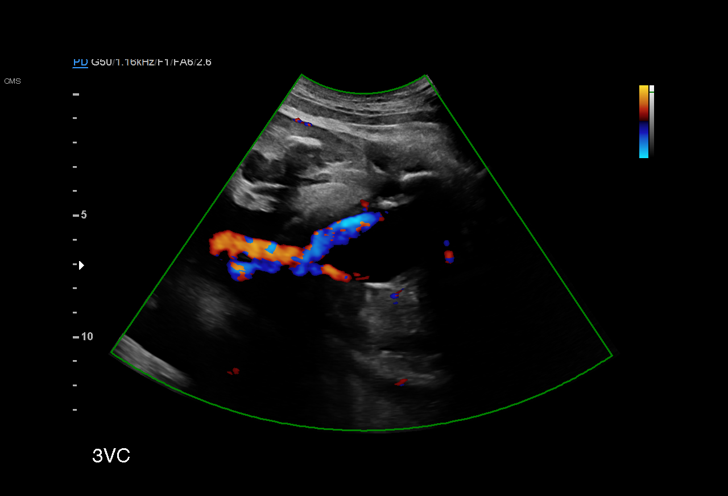
[im 41/61]
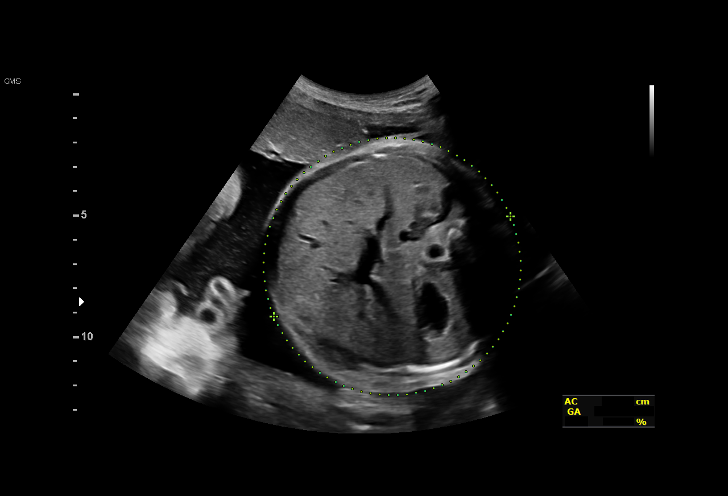
[im 45/61]
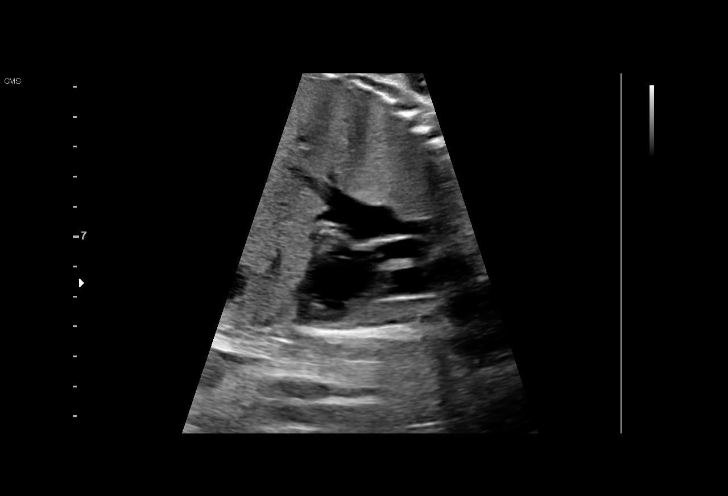
[im 49/61]
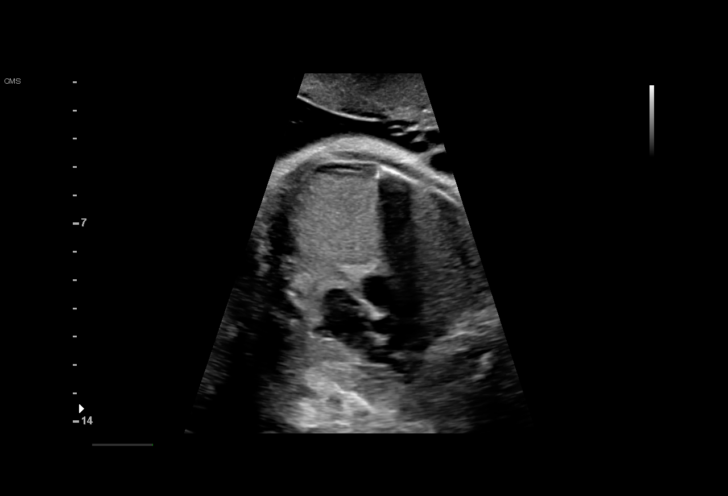
[im 54/61]
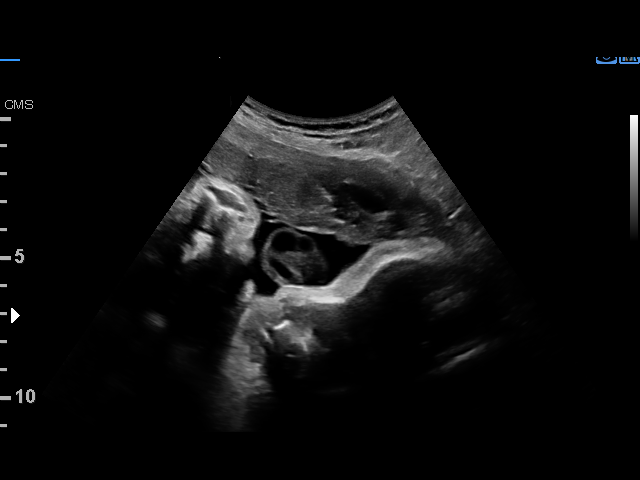
[im 58/61]
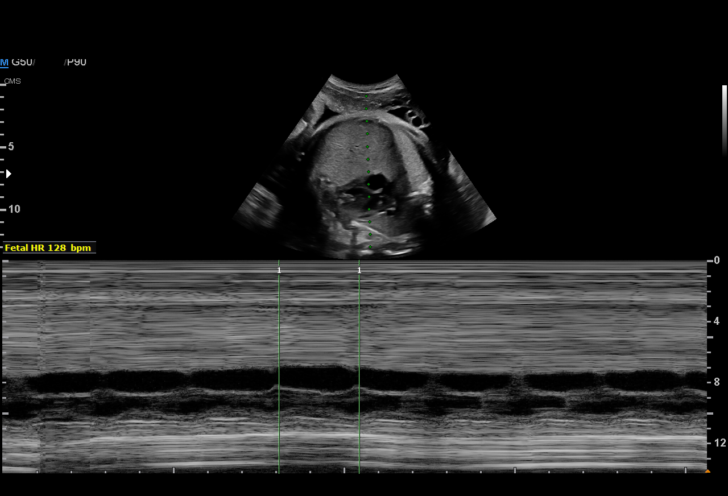

[13 of 28 positions shown; findings below may reference images not displayed]

----------------------------------------------------------------------

 ----------------------------------------------------------------------
Indications

  Encounter for other antenatal screening
  follow-up
  Ovarian dermoid cyst complicating              O34.80,
  pregnancy, antepartum (right)
  36 weeks gestation of pregnancy
 ----------------------------------------------------------------------
Fetal Evaluation

 Num Of Fetuses:         1
 Fetal Heart Rate(bpm):  128
 Cardiac Activity:       Observed
 Presentation:           Cephalic
 Placenta:               Anterior
 P. Cord Insertion:      Visualized

 Amniotic Fluid
 AFI FV:      Within normal limits

 AFI Sum(cm)     %Tile       Largest Pocket(cm)
 16.06           60

 RUQ(cm)       RLQ(cm)       LUQ(cm)        LLQ(cm)

Biometry

 BPD:      91.7  mm     G. Age:  37w 2d         81  %    CI:           79   %    70 - 86
                                                         FL/HC:      21.0   %    20.1 -
 HC:      326.2  mm     G. Age:  37w 0d         34  %    HC/AC:      0.96        0.93 -
 AC:      338.5  mm     G. Age:  37w 5d         90  %    FL/BPD:     74.7   %    71 - 87
 FL:       68.5  mm     G. Age:  35w 1d         17  %    FL/AC:      20.2   %    20 - 24
 HUM:      61.5  mm     G. Age:  35w 4d         51  %

 Est. FW:    7520  gm    6 lb 13 oz      69  %
OB History

 Gravidity:    3         Term:   2        Prem:   0        SAB:   0
 TOP:          0       Ectopic:  0        Living: 2
Gestational Age

 LMP:           37w 1d        Date:  05/09/18                 EDD:   02/13/19
 U/S Today:     36w 6d                                        EDD:   02/15/19
 Best:          36w 3d     Det. By:  Early Ultrasound         EDD:   02/18/19
                                     (06/28/18)
Anatomy

 Cranium:               Appears normal         LVOT:                   Appears normal
 Cavum:                 Appears normal         Aortic Arch:            Appears normal
 Ventricles:            Appears normal         Ductal Arch:            Previously seen
 Choroid Plexus:        Appears normal         Diaphragm:              Appears normal
 Cerebellum:            Appears normal         Stomach:                Appears normal, left
                                                                       sided
 Posterior Fossa:       Previously seen        Abdomen:                Appears normal
 Nuchal Fold:           Previously seen        Abdominal Wall:         Appears nml (cord
                                                                       insert, abd wall)
 Face:                  Appears normal         Cord Vessels:           Appears normal (3
                        (orbits and profile)                           vessel cord)
 Lips:                  Appears normal         Kidneys:                Appear normal
 Palate:                Previously seen        Bladder:                Appears normal
 Thoracic:              Appears normal         Spine:                  Previously seen
 Heart:                 Previously seen        Upper Extremities:      Previously seen
 RVOT:                  Appears normal         Lower Extremities:      Previously seen

 Other:  Heels previously visualized. Nasal bone visualized.
Cervix Uterus Adnexa

 Cervix
 Not visualized (advanced GA >02wks)

 Uterus
 No abnormality visualized.

 Left Ovary
 Within normal limits.

 Right Ovary
 Dermoid cyst measuring  7.7 x 6.8 x 5.2 cm.

 Cul De Sac
 No free fluid seen.

 Adnexa
 No abnormality visualized.
Impression

 Normal interval growth.
 Dermoid cyst
Recommendations

 Follow up as clinically indicated.

## 2021-09-23 ENCOUNTER — Encounter: Payer: Self-pay | Admitting: *Deleted

## 2021-11-08 IMAGING — US US PELVIS COMPLETE WITH TRANSVAGINAL
1 series · 15 of 25 positions shown · non-contrast
Comparison: 12/05/2018

Correlation: MR pelvis 09/02/2018

CLINICAL DATA: Adnexal mass, follow-up



[Series 1: us pelvis complete with transvaginal · 15 of 69 slices shown]
[im 1/69]
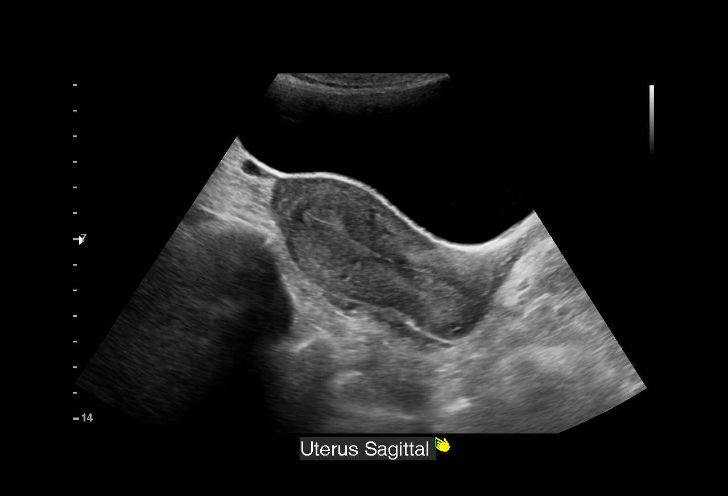
[im 6/69]
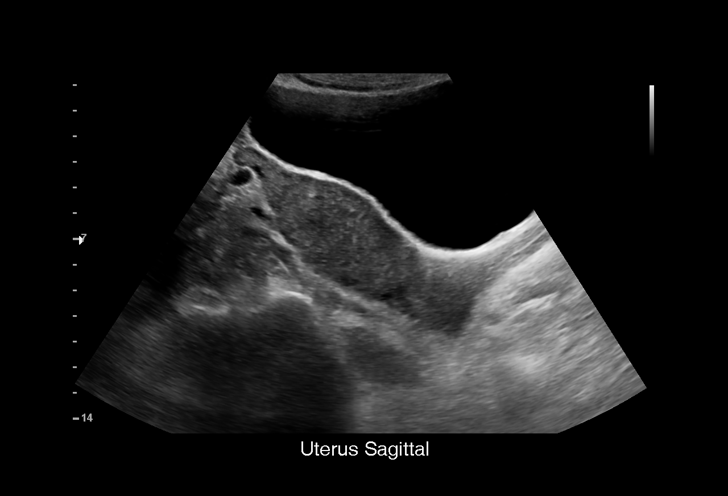
[im 12/69]
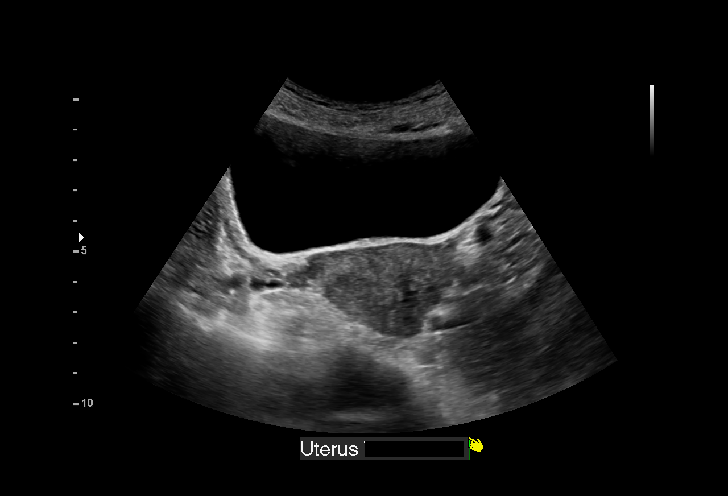
[im 15/69]
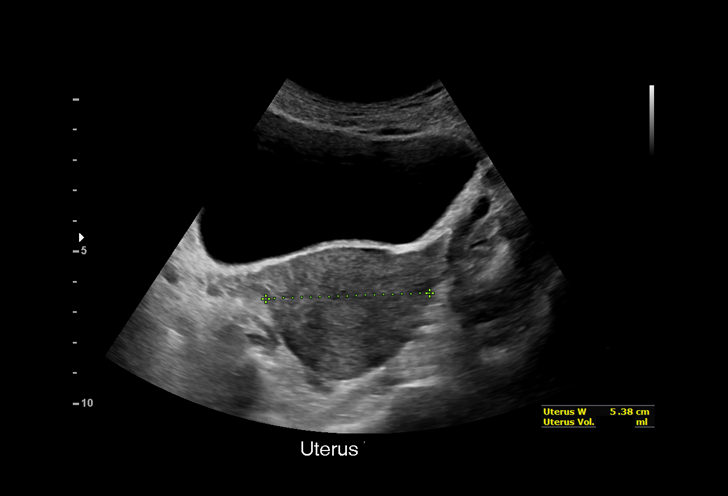
[im 20/69]
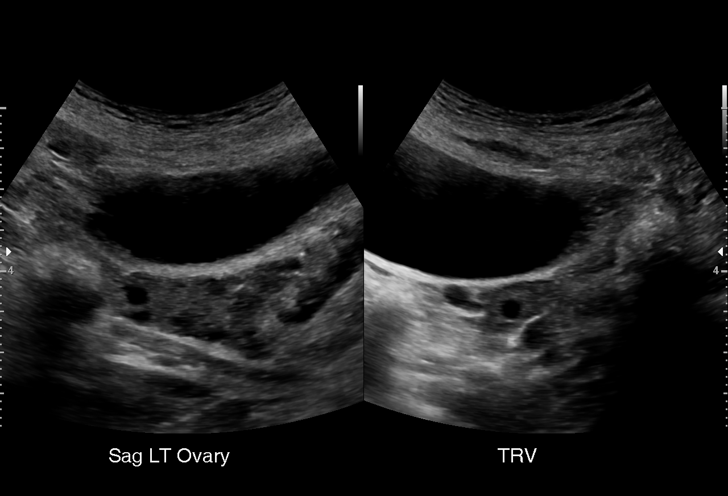
[im 26/69]
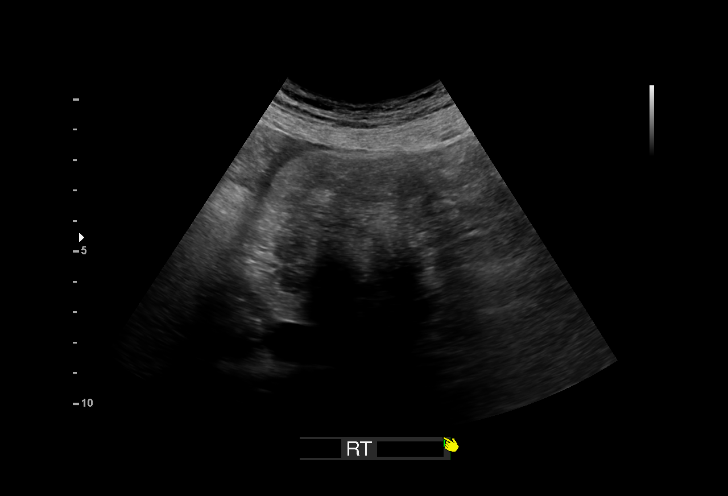
[im 29/69]
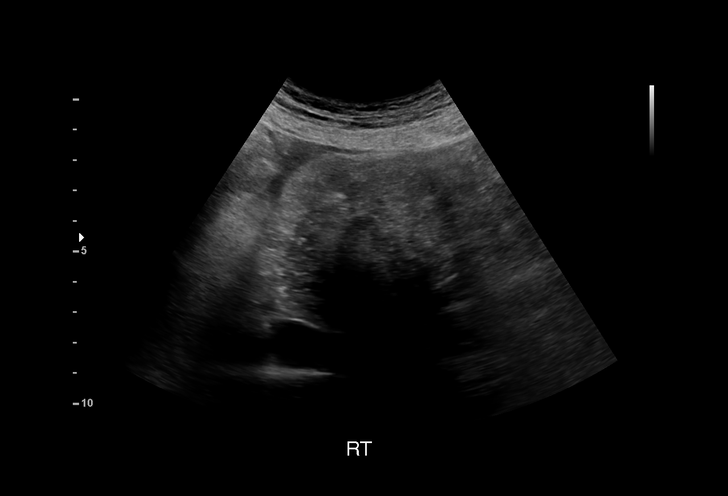
[im 35/69]
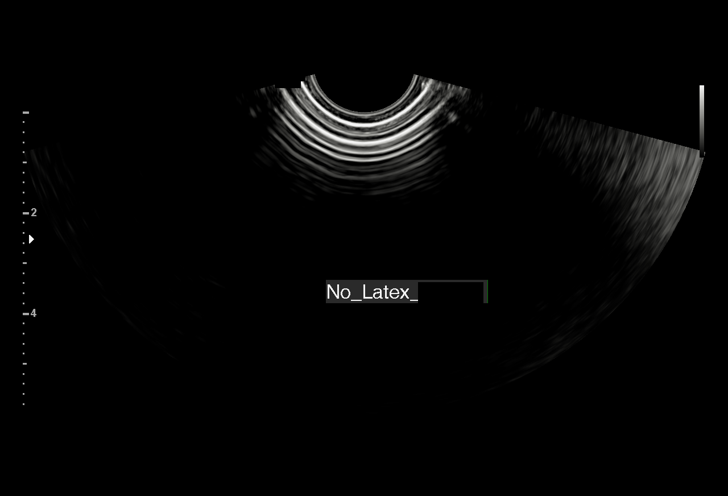
[im 40/69]
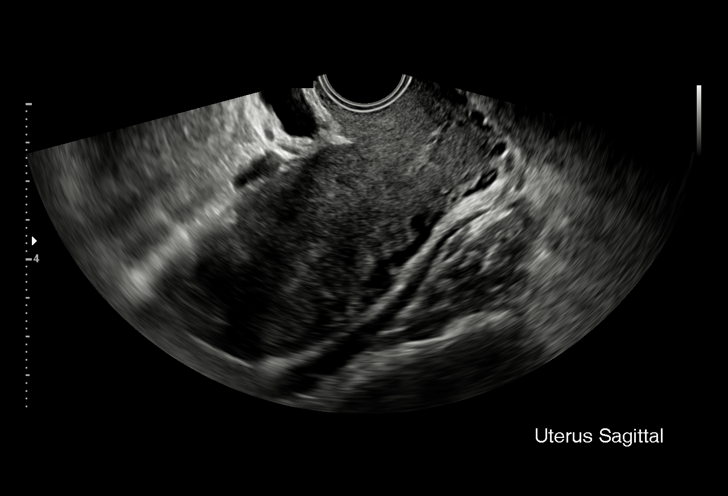
[im 43/69]
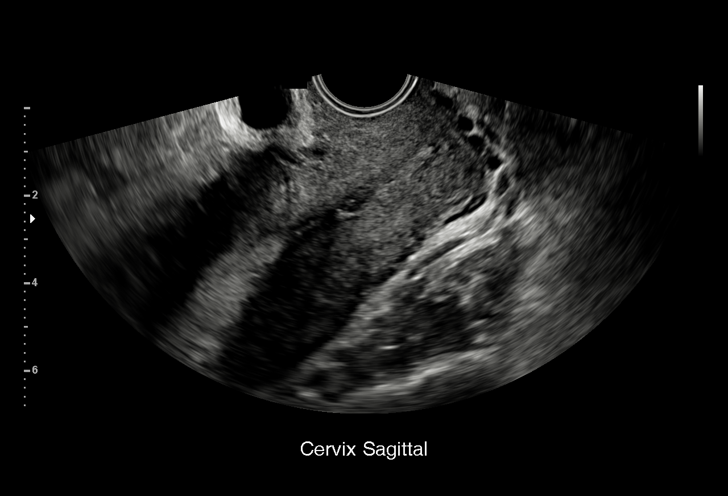
[im 49/69]
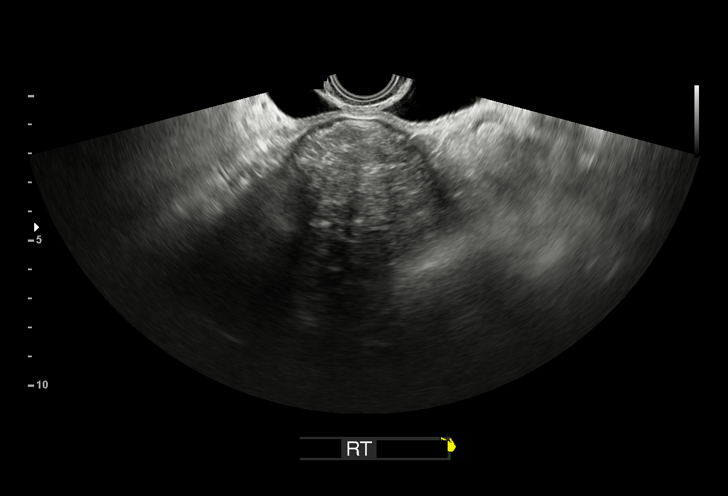
[im 54/69]
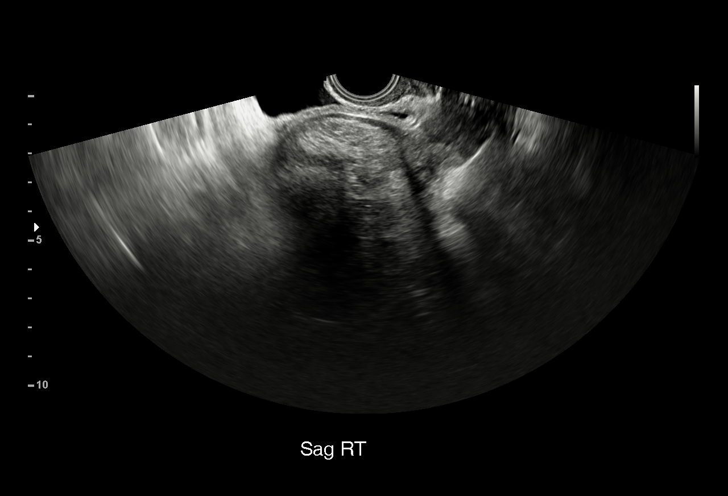
[im 57/69]
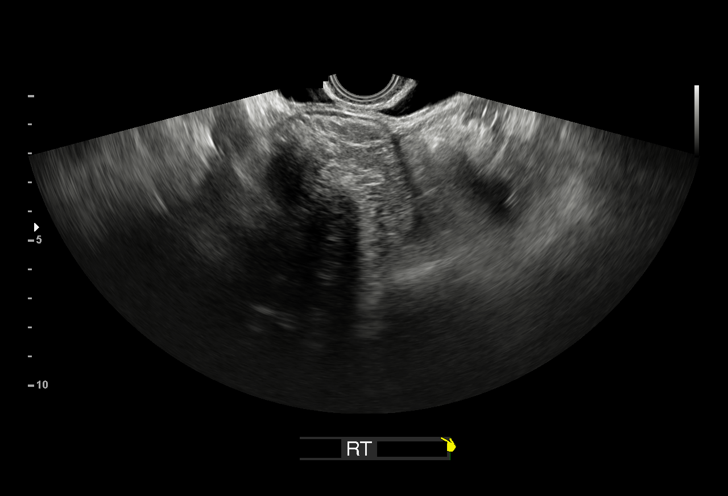
[im 63/69]
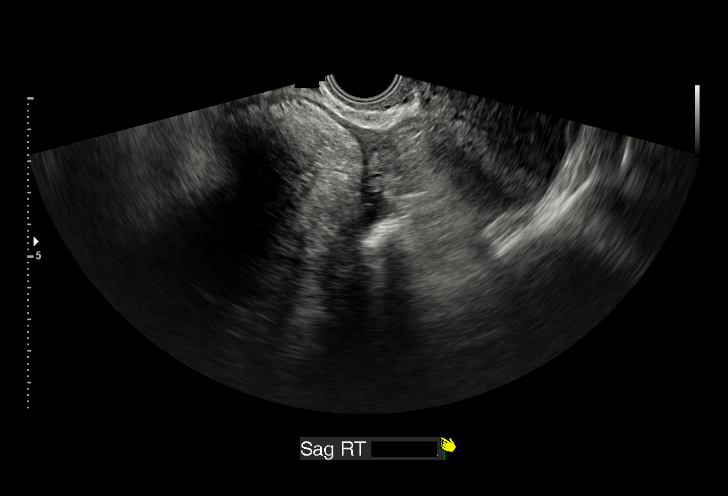
[im 69/69]
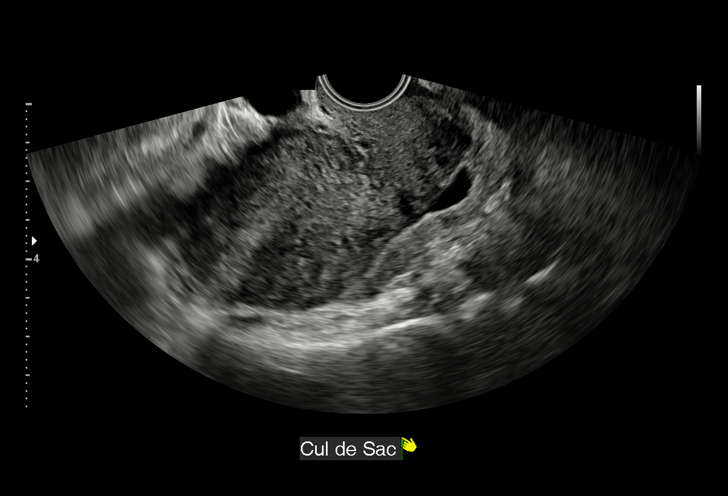

[15 of 25 positions shown; findings below may reference images not displayed]

FINDINGS: Uterus

Measurements: 8.5 x 4.1 x 5.8 cm = volume: 106 mL. Anteverted. No
intrauterine mass identified.

Endometrium

Thickness: 12 mm.  No endometrial fluid or focal abnormality

Right ovary

Measurements: 2.8 x 1.3 x 1.5 cm = volume: 2.7 mL. Normal morphology
without mass

Left ovary

Measurements: 2.6 x 1.1 x 1.4 cm = volume: 2.0 mL. Normal morphology
without mass

Other findings

Trace free pelvic fluid.

RIGHT adnexal mass identified 6.4 x 5.9 x 6.1 cm, heterogeneous in
appearance, with areas of lower echogenicity as well as hyperechoic
nonshadowing regions. Sonographic appearance is most consistent with
a dermoid tumor; this corresponds to the dermoid tumor identified on
the prior MRI.
IMPRESSION: Heterogeneous RIGHT adnexal mass 6.4 cm greatest size with areas of
scattered hyperechogenicity consistent with a RIGHT ovarian dermoid
tumor, unchanged since 4646 MR.

Remaining ultrasound exam unremarkable.

## 2021-12-26 ENCOUNTER — Ambulatory Visit: Payer: Medicaid Other | Admitting: Student

## 2021-12-26 ENCOUNTER — Encounter: Payer: Self-pay | Admitting: Student

## 2021-12-26 ENCOUNTER — Telehealth: Payer: Self-pay

## 2021-12-26 VITALS — BP 110/62 | HR 67 | Wt 195.2 lb

## 2021-12-26 DIAGNOSIS — R600 Localized edema: Secondary | ICD-10-CM | POA: Diagnosis not present

## 2021-12-26 NOTE — Telephone Encounter (Signed)
Patient calls nurse line regarding visit this morning with Dr. Joelyn Oms. She is requesting a letter for work to allow her more frequent breaks and time to sit down due to leg swelling.   Will forward to Dr. Joelyn Oms.   She is requesting letter via mychart if possible.   Talbot Grumbling, RN

## 2021-12-26 NOTE — Progress Notes (Addendum)
    SUBJECTIVE:   CHIEF COMPLAINT / HPI:   RLE Edema and Foot Arch Pain Patient presents today with chronic right lower extremity edema with worsening pain along the arch of her foot.  Per chart review, this has been present at least back to 2018 and has been worked up with an x-ray and a DVT ultrasound.  She does not have any skin changes to the area.  She does wear compression stocking daily.  She had previously attributed the swelling to being on her feet a lot at work.  The pain is worst at the end of the day after she has been on her foot for a while.  She thinks that the pain in her arch has been present for the last 1 to 2 months.  She has been treating this with just as needed Tylenol.  It does not necessarily impact her ability to complete her work duties but she would like to know what is causing her issues.    OBJECTIVE:   BP 110/62   Pulse 67   Wt 195 lb 3.2 oz (88.5 kg)   SpO2 99%   BMI 33.51 kg/m   LLE: Left foot and ankle are without edema or deformity.  Full range of motion about ankle.  Distal pulses 2+. RLE: Right foot, ankle, and lower calf are markedly edematous.  Edema is nonpitting.  There are no overlying skin changes to suggest chronic venous insufficiency.  She does have full range of motion about the ankle without pain.  There is no pain on heel squeeze.  She does have some mild tenderness along the plantar aspect of the foot arch.  Distal pulses are intact.  Negative calf squeeze and Homans' sign.  ASSESSMENT/PLAN:   Lower extremity edema Chronic, unclear etiology.  Do not believe that she has a blood clot, especially given previously negative DVT ultrasound.  No injury to suggest that there is an acute fracture causing localized inflammation.  Differential here is broad.  Curiously, she does have new onset arch pain in addition to her chronic edema, there is apparent flattening of the arch to inspection during exam today.  I am not concerned for a Charcot foot  given no history of diabetes.  We will continue work-up with an MRI of the foot to evaluate for previously unidentified congenital malformations, insufficiency fractures, etc.  If MRI is negative, will look further up the lower extremity for causes such as lymph node obstruction, though exam is not entirely consistent with lymphedema.      Cassandra Dubonnet, MD [place]

## 2021-12-26 NOTE — Assessment & Plan Note (Signed)
Chronic, unclear etiology.  Do not believe that she has a blood clot, especially given previously negative DVT ultrasound.  No injury to suggest that there is an acute fracture causing localized inflammation.  Differential here is broad.  Curiously, she does have new onset arch pain in addition to her chronic edema, there is apparent flattening of the arch to inspection during exam today.  I am not concerned for a Charcot foot given no history of diabetes.  We will continue work-up with an MRI of the foot to evaluate for previously unidentified congenital malformations, insufficiency fractures, etc.  If MRI is negative, will look further up the lower extremity for causes such as lymph node obstruction, though exam is not entirely consistent with lymphedema.

## 2021-12-26 NOTE — Patient Instructions (Signed)
Ms. Camarena,  It is so nice to meet you.  I am sorry that you have been having this issue in your leg for so long.  Given the duration of your symptoms and the apparent changing of shape of your foot arch, I am interested in getting an MRI to get a good look at your foot to make sure that there is not a anatomical abnormality that we have been missing.  I am reassured that I do not think you are having a blood clot or anything acute or scary like that. In the meantime, please continue to use your compression stockings and to take ibuprofen and Tylenol as needed.  If you have an acute change, like sudden onset of pain or if you feel like your foot has "collapsed."  Please come back and see [place] sooner rather than later.  Pearla Dubonnet, MD

## 2022-01-08 ENCOUNTER — Telehealth: Payer: Self-pay

## 2022-01-08 ENCOUNTER — Telehealth: Payer: Self-pay | Admitting: Student

## 2022-01-08 NOTE — Telephone Encounter (Signed)
Called [facility] for Peer-to-Peer regarding MRI R foot. Case # [id number]. Imaging study has been authorized. Authorization #[id number]. Authorization is good until Feb 28, 2022.  Will have clinic staff call to schedule for patient.  Pearla Dubonnet, MD

## 2022-01-08 NOTE — Telephone Encounter (Signed)
Informed patient of upcoming appointment.  MRI Right foot [place] entrance C 01/19/2022 1500 hrs  1430 arrival   .Ozella Almond, CMA

## 2022-01-19 ENCOUNTER — Other Ambulatory Visit: Payer: Self-pay | Admitting: Student

## 2022-01-19 ENCOUNTER — Ambulatory Visit (HOSPITAL_COMMUNITY)
Admission: RE | Admit: 2022-01-19 | Discharge: 2022-01-19 | Disposition: A | Discharge status: Home / Self Care | Payer: Medicaid Other | Source: Ambulatory Visit | Attending: Family Medicine | Admitting: Family Medicine

## 2022-01-19 DIAGNOSIS — R600 Localized edema: Secondary | ICD-10-CM | POA: Insufficient documentation

## 2022-01-19 MED ORDER — GADOPICLENOL 0.5 MMOL/ML IV SOLN
9.0000 mL | Freq: Once | INTRAVENOUS | Status: AC | PRN
  Administered 2022-01-19: 9 mL via INTRAVENOUS

## 2022-01-19 NOTE — Addendum Note (Signed)
Addended by: Jim Like B on: 01/19/2022 10:22 AM   Modules accepted: Orders

## 2022-01-22 NOTE — Addendum Note (Signed)
Addended by: Jim Like B on: 01/22/2022 01:42 PM   Modules accepted: Orders

## 2022-01-26 ENCOUNTER — Telehealth: Payer: Self-pay | Admitting: Student

## 2022-01-26 DIAGNOSIS — R600 Localized edema: Secondary | ICD-10-CM

## 2022-01-26 DIAGNOSIS — Z9889 Other specified postprocedural states: Secondary | ICD-10-CM

## 2022-01-26 NOTE — Telephone Encounter (Signed)
Called [employer] and was advised that they would not pay for CT of the pelvis without a recent negative DVT US. I expressed my thinking that her LE edema was far more likely to be from lymphatic obstruction than from a DVT given chronicity and prior negative DVT US. Nonetheless, a negative DVT US will be required before we can move forward with a CT of the pelvis.  - DVT US scheduled - Will re-order CT Scan pending negative [place] results  Pearla Dubonnet, MD

## 2022-02-06 ENCOUNTER — Ambulatory Visit (HOSPITAL_COMMUNITY): Payer: Medicaid Other

## 2022-02-09 ENCOUNTER — Ambulatory Visit (HOSPITAL_COMMUNITY)
Admission: RE | Admit: 2022-02-09 | Discharge: 2022-02-09 | Disposition: A | Discharge status: Home / Self Care | Payer: Medicaid Other | Source: Ambulatory Visit | Attending: Family Medicine | Admitting: Family Medicine

## 2022-02-09 DIAGNOSIS — R600 Localized edema: Secondary | ICD-10-CM | POA: Insufficient documentation

## 2022-02-09 NOTE — Addendum Note (Signed)
Addended by: Jim Like B on: 02/09/2022 10:07 AM   Modules accepted: Orders

## 2022-02-13 ENCOUNTER — Ambulatory Visit
Admission: RE | Admit: 2022-02-13 | Discharge: 2022-02-13 | Disposition: A | Discharge status: Home / Self Care | Payer: Medicaid Other | Source: Ambulatory Visit | Attending: Family Medicine | Admitting: Family Medicine

## 2022-02-13 DIAGNOSIS — R600 Localized edema: Secondary | ICD-10-CM

## 2022-02-13 DIAGNOSIS — N83202 Unspecified ovarian cyst, left side: Secondary | ICD-10-CM | POA: Diagnosis not present

## 2022-02-13 DIAGNOSIS — K760 Fatty (change of) liver, not elsewhere classified: Secondary | ICD-10-CM | POA: Diagnosis not present

## 2022-02-13 DIAGNOSIS — Z9889 Other specified postprocedural states: Secondary | ICD-10-CM

## 2022-02-13 MED ORDER — IOPAMIDOL (ISOVUE-300) INJECTION 61%
100.0000 mL | Freq: Once | INTRAVENOUS | Status: AC | PRN
  Administered 2022-02-13: 100 mL via INTRAVENOUS

## 2022-02-24 NOTE — Addendum Note (Signed)
Addended by: Jim Like B on: 02/24/2022 11:42 AM   Modules accepted: Orders

## 2022-03-26 ENCOUNTER — Other Ambulatory Visit: Payer: Self-pay | Admitting: *Deleted

## 2022-03-26 DIAGNOSIS — R600 Localized edema: Secondary | ICD-10-CM

## 2022-03-31 NOTE — Progress Notes (Unsigned)
Requested by:  Rise Patience, DO [address] [place],  [place] [address]  Reason for consultation: RLE swelling     History of Present Illness   Cassandra [name] is a 31 y.o. (10/31/1990) female who presents for evaluation of RLE swelling. She explains that the swelling has been present since about 2018. However she just recently within past several months started having pain in the plantar aspect of her foot. She describes it as intermittent sharp stabbing pains. Occurs more often on prolonged standing/ ambulation. She has seen PCP for this, who recommended vascular evaluation. She works at [employer] so reports increased swelling and discomfort as the day goes on. She denies any injury or prior surgeries to her right leg. She has been wearing compression stockings OTC to see if that would help her swelling. She reports very minimal change with use of them. Denies any throbbing, aching, burning, itching, bleeding, skin changes or ulceration. She does not elevation. She has no history of DVT. No family history of venous disease or DVT.   Venous symptoms include: swelling, stabbing pain on plantar foot Onset/duration:  < 6 months of pain, swelling 5 years  Occupation:  Works at [employer] factors: prolonged ambulation, standing Alleviating factors: none Compression:  yes Helps: minimally Pain medications:  no Previous vein procedures:  no History of DVT:  no  Past Medical History:  Diagnosis Date   Ovarian cyst    symptomatic   Wears glasses     Past Surgical History:  Procedure Laterality Date   LAPAROSCOPIC OVARIAN CYSTECTOMY Right 08/08/2019   Procedure: LAPAROSCOPIC OVARIAN CYSTECTOMY AND RIGHT OOPHORECTOMY;  Surgeon: Osborne Oman, MD;  Location: [facility];  Service: Gynecology;  Laterality: Right;   WISDOM TOOTH EXTRACTION  2008    Social History   Socioeconomic History   Marital status: Significant Other    Spouse name: Not on file    Number of children: 2   Years of education: Not on file   Highest education level: Not on file  Occupational History   Not on file  Tobacco Use   Smoking status: Never   Smokeless tobacco: Never  Vaping Use   Vaping Use: Never used  Substance and Sexual Activity   Alcohol use: No   Drug use: Never   Sexual activity: Yes    Birth control/protection: Injection  Other Topics Concern   Not on file  Social History Narrative   Not on file   Social Determinants of Health   Financial Resource Strain: Low Risk  (02/23/2019)   Overall Financial Resource Strain (CARDIA)    Difficulty of Paying Living Expenses: Not hard at all  Food Insecurity: No Food Insecurity (08/28/2019)   Hunger Vital Sign    Worried About Running Out of Food in the Last Year: Never true    [place] in the Last Year: Never true  Transportation Needs: No Transportation Needs (08/28/2019)   PRAPARE - Hydrologist (Medical): No    Lack of Transportation (Non-Medical): No  Physical Activity: Not on file  Stress: No Stress Concern Present (02/23/2019)   [place]    Feeling of Stress : Not at all  Social Connections: Not on file  Intimate Partner Violence: Not At Risk (02/23/2019)   Humiliation, Afraid, Rape, and Kick questionnaire    Fear of Current or Ex-Partner: No    Emotionally Abused: No  Physically Abused: No    Sexually Abused: No    Family History  Problem Relation Age of Onset   Asthma Mother    Diabetes Mother    Asthma Sister    Asthma Brother    Cancer Maternal Uncle    Hypertension Paternal Grandfather     Current Outpatient Medications  Medication Sig Dispense Refill   acetaminophen (TYLENOL) 325 MG tablet Take 2 tablets (650 mg total) by mouth every 4 (four) hours as needed (for pain scale < 4).     ibuprofen (ADVIL) 600 MG tablet Take 1 tablet (600 mg total) by mouth every 6 (six) hours  as needed for headache, mild pain, moderate pain or cramping. 30 tablet 2   No current facility-administered medications for this visit.    No Known Allergies  REVIEW OF SYSTEMS (negative unless checked):   Cardiac:  '[]'$  Chest pain or chest pressure? '[]'$  Shortness of breath upon activity? '[]'$  Shortness of breath when lying flat? '[]'$  Irregular heart rhythm?  Vascular:  '[]'$  Pain in calf, thigh, or hip brought on by walking? '[]'$  Pain in feet at night that wakes you up from your sleep? '[]'$  Blood clot in your veins? '[x]'$  Leg swelling?  Pulmonary:  '[]'$  Oxygen at home? '[]'$  Productive cough? '[]'$  Wheezing?  Neurologic:  '[]'$  Sudden weakness in arms or legs? '[]'$  Sudden numbness in arms or legs? '[]'$  Sudden onset of difficult speaking or slurred speech? '[]'$  Temporary loss of vision in one eye? '[]'$  Problems with dizziness?  Gastrointestinal:  '[]'$  Blood in stool? '[]'$  Vomited blood?  Genitourinary:  '[]'$  Burning when urinating? '[]'$  Blood in urine?  Psychiatric:  '[]'$  Major depression  Hematologic:  '[]'$  Bleeding problems? '[]'$  Problems with blood clotting?  Dermatologic:  '[]'$  Rashes or ulcers?  Constitutional:  '[]'$  Fever or chills?  Ear/Nose/Throat:  '[]'$  Change in hearing? '[]'$  Nose bleeds? '[]'$  Sore throat?  Musculoskeletal:  '[]'$  Back pain? '[]'$  Joint pain? '[]'$  Muscle pain?   Physical Examination     Vitals:   04/01/22 1236  BP: (!) 134/94  Pulse: 61  Temp: 98.2 F (36.8 C)  TempSrc: Temporal  SpO2: 100%  Weight: 199 lb (90.3 kg)  Height: '5\' 5"'$  (2.440 m)   Body mass index is 33.12 kg/m.  General:  WDWN in NAD; vital signs documented above Gait: Normal HENT: WNL, normocephalic Pulmonary: normal non-labored breathing  Cardiac: regular HR Abdomen: soft Vascular Exam/Pulses:2+ radial pulses, 2+ DP/ PT pulses bilaterally. Feet warm and well perfused.  Extremities: without varicose veins, without reticular veins, with edema of right distal leg, ankle and dorsum of right foot, without  stasis pigmentation, without lipodermatosclerosis, without ulcers Musculoskeletal: no muscle wasting or atrophy  Neurologic: A&O X 3;  No focal weakness or paresthesias are detected Psychiatric:  The pt has Normal affect.  Non-invasive Vascular Imaging   BLE Venous Insufficiency Duplex (04/01/22):  RLE:  No DVT and SVT GSV reflux at [facility], knee, mid- proximal calf GSV diameter 0.15-0.22 cm No SSV reflux  No deep venous reflux Has competent AASV   Medical Decision Making   Cassandra [name] is a 31 y.o. female who presents with: RLE chronic venous insufficiency with swelling and pain. Her right foot pain is not typical for venous disease. I suspect possibly could be related to her plantar fascia but it is possible that swelling is causing increased stress onto foot and may be leading to pain. Her duplex today shows no DVT or SVT. She does not have any deep  reflux. She does have superficial venous reflux in GSV at [facility], knee and proximal - mid calf. No SSV reflux. She does have competent AASV as well. Her veins are very small and therefore she would not be candidate for venous lazer ablation. I do think she may have component of lymphedema in her right leg as well.  Based on the patient's history and examination, I recommend: daily elevation above level of her heart, knee high 15-20 mmHg compression stockings, exercise, refraining from prolonged sitting or standing. She will try these conservative measures for 6 weeks and will have her return for evaluation for possible approval of compression boots   Karoline Caldwell, PA-C [facility]: [phone]  04/01/2022, 1:50 PM  Clinic MD: [place]/ [facility] Matters

## 2022-04-01 ENCOUNTER — Ambulatory Visit: Payer: Medicaid Other | Admitting: Physician Assistant

## 2022-04-01 ENCOUNTER — Ambulatory Visit (HOSPITAL_COMMUNITY)
Admission: RE | Admit: 2022-04-01 | Discharge: 2022-04-01 | Disposition: A | Discharge status: Home / Self Care | Payer: Medicaid Other | Source: Ambulatory Visit | Attending: Vascular Surgery | Admitting: Vascular Surgery

## 2022-04-01 VITALS — BP 134/94 | HR 61 | Temp 98.2°F | Ht 65.0 in | Wt 199.0 lb

## 2022-04-01 DIAGNOSIS — I872 Venous insufficiency (chronic) (peripheral): Secondary | ICD-10-CM

## 2022-04-01 DIAGNOSIS — I890 Lymphedema, not elsewhere classified: Secondary | ICD-10-CM | POA: Diagnosis not present

## 2022-04-01 DIAGNOSIS — R600 Localized edema: Secondary | ICD-10-CM | POA: Insufficient documentation

## 2022-05-13 ENCOUNTER — Ambulatory Visit: Payer: Medicaid Other | Admitting: Physician Assistant

## 2022-05-13 VITALS — BP 121/79 | HR 72 | Temp 97.7°F | Ht 64.0 in | Wt 191.0 lb

## 2022-05-13 DIAGNOSIS — I890 Lymphedema, not elsewhere classified: Secondary | ICD-10-CM | POA: Diagnosis not present

## 2022-05-13 DIAGNOSIS — R600 Localized edema: Secondary | ICD-10-CM | POA: Diagnosis not present

## 2022-05-13 NOTE — Progress Notes (Addendum)
Office Note     CC:  follow up Requesting Provider:  Rise Patience, DO  HPI: Cassandra [name] is a 32 y.o. (28-Jan-1991) female who presents for reevaluation of right leg swelling.  She has been dealing with swelling symptoms for about 3 to 4 years.  Since last office visit she has worn knee-high compression on a regular basis.  She has made an effort to elevate her legs during the day.  She is also try to avoid prolonged sitting and standing.  Despite the above, she is still complaining of discomfort associated with right leg edema.  She has no history of DVT.  She denies tobacco use.    Past Medical History:  Diagnosis Date   Ovarian cyst    symptomatic   Wears glasses     Past Surgical History:  Procedure Laterality Date   LAPAROSCOPIC OVARIAN CYSTECTOMY Right 08/08/2019   Procedure: LAPAROSCOPIC OVARIAN CYSTECTOMY AND RIGHT OOPHORECTOMY;  Surgeon: Osborne Oman, MD;  Location: [facility];  Service: Gynecology;  Laterality: Right;   WISDOM TOOTH EXTRACTION  2008    Social History   Socioeconomic History   Marital status: Significant Other    Spouse name: Not on file   Number of children: 2   Years of education: Not on file   Highest education level: Not on file  Occupational History   Not on file  Tobacco Use   Smoking status: Never   Smokeless tobacco: Never  Vaping Use   Vaping Use: Never used  Substance and Sexual Activity   Alcohol use: No   Drug use: Never   Sexual activity: Yes    Birth control/protection: Injection  Other Topics Concern   Not on file  Social History Narrative   Not on file   Social Determinants of Health   Financial Resource Strain: Low Risk  (02/23/2019)   Overall Financial Resource Strain (CARDIA)    Difficulty of Paying Living Expenses: Not hard at all  Food Insecurity: No Food Insecurity (08/28/2019)   Hunger Vital Sign    Worried About Running Out of Food in the Last Year: Never true    [place] in  the Last Year: Never true  Transportation Needs: No Transportation Needs (08/28/2019)   PRAPARE - Hydrologist (Medical): No    Lack of Transportation (Non-Medical): No  Physical Activity: Not on file  Stress: No Stress Concern Present (02/23/2019)   [place]    Feeling of Stress : Not at all  Social Connections: Not on file  Intimate Partner Violence: Not At Risk (02/23/2019)   Humiliation, Afraid, Rape, and Kick questionnaire    Fear of Current or Ex-Partner: No    Emotionally Abused: No    Physically Abused: No    Sexually Abused: No    Family History  Problem Relation Age of Onset   Asthma Mother    Diabetes Mother    Asthma Sister    Asthma Brother    Cancer Maternal Uncle    Hypertension Paternal Grandfather     Current Outpatient Medications  Medication Sig Dispense Refill   acetaminophen (TYLENOL) 325 MG tablet Take 2 tablets (650 mg total) by mouth every 4 (four) hours as needed (for pain scale < 4).     ibuprofen (ADVIL) 600 MG tablet Take 1 tablet (600 mg total) by mouth every 6 (six) hours as needed for headache, mild pain, moderate  pain or cramping. 30 tablet 2   No current facility-administered medications for this visit.    No Known Allergies   REVIEW OF SYSTEMS:   '[X]'$  denotes positive finding, '[ ]'$  denotes negative finding Cardiac  Comments:  Chest pain or chest pressure:    Shortness of breath upon exertion:    Short of breath when lying flat:    Irregular heart rhythm:        Vascular    Pain in calf, thigh, or hip brought on by ambulation:    Pain in feet at night that wakes you up from your sleep:     Blood clot in your veins:    Leg swelling:         Pulmonary    Oxygen at home:    Productive cough:     Wheezing:         Neurologic    Sudden weakness in arms or legs:     Sudden numbness in arms or legs:     Sudden onset of difficulty speaking  or slurred speech:    Temporary loss of vision in one eye:     Problems with dizziness:         Gastrointestinal    Blood in stool:     Vomited blood:         Genitourinary    Burning when urinating:     Blood in urine:        Psychiatric    Major depression:         Hematologic    Bleeding problems:    Problems with blood clotting too easily:        Skin    Rashes or ulcers:        Constitutional    Fever or chills:      PHYSICAL EXAMINATION:  Vitals:   05/13/22 1346  BP: 121/79  Pulse: 72  Temp: 97.7 F (36.5 C)  TempSrc: Temporal  SpO2: 99%  Weight: 191 lb (86.6 kg)  Height: '5\' 4"'$  (1.626 m)    General:  WDWN in NAD; vital signs documented above Gait: Not observed HENT: WNL, normocephalic Pulmonary: normal non-labored breathing , without Rales, rhonchi,  wheezing Cardiac: regular HR Abdomen: soft, NT, no masses Skin: without rashes Vascular Exam/Pulses: Extremities: Palpable right DP pulse; edema of the right leg compared to the left; hyperpigmentation right leg Musculoskeletal: no muscle wasting or atrophy  Neurologic: A&O X 3;  No focal weakness or paresthesias are detected Psychiatric:  The pt has Normal affect.   Non-Invasive Vascular Imaging:   Right:  - No evidence of deep vein thrombosis seen in the right lower extremity,  from the common femoral through the popliteal veins.  - No evidence of superficial venous reflux seen in the right short  saphenous vein.  - Venous reflux is noted in the right sapheno-femoral junction.  - Venous reflux is noted in the right greater saphenous vein in the calf.  - No edivence of reflux in the AASV.     ASSESSMENT/PLAN:: 32 y.o. female with ongoing edema of the right leg with hyperpigmentation; lymphedema  -Since last office visit on 04/01/2022, the patient has been wearing her knee-high compression stockings on a regular basis.  She has also been making an effort to elevate her legs during the day.  She is  also tried exercising however has experienced minimal weight loss.  She would benefit from lymphedema pumps.  We will reach out to bio tab to  see if she would be a candidate.  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  This would make her a poor candidate for laser ablation therapy.  She will follow-up on an as-needed basis.   Dagoberto Ligas, PA-C [facility] [phone]  Clinic MD:   Donzetta Matters

## 2022-05-27 ENCOUNTER — Encounter: Payer: Self-pay | Admitting: Physician Assistant

## 2022-08-10 ENCOUNTER — Ambulatory Visit: Payer: Medicaid Other | Admitting: Family Medicine

## 2022-08-10 VITALS — BP 132/86 | HR 64 | Temp 98.1°F | Ht 64.0 in | Wt 200.0 lb

## 2022-08-10 DIAGNOSIS — Z23 Encounter for immunization: Secondary | ICD-10-CM

## 2022-08-10 DIAGNOSIS — Z111 Encounter for screening for respiratory tuberculosis: Secondary | ICD-10-CM | POA: Diagnosis not present

## 2022-08-10 DIAGNOSIS — Z3009 Encounter for other general counseling and advice on contraception: Secondary | ICD-10-CM

## 2022-08-10 NOTE — Progress Notes (Signed)
    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 Wants to check what vaccinations are needed based on required list for job. Also thinks she needs TB testing.   PERTINENT  PMH / PSH: None  OBJECTIVE:   BP 132/86   Pulse 64   Temp 98.1 F (36.7 C)   Ht  (1.626 m)   Wt 200 lb (90.7 kg)   SpO2 99%   BMI 34.33 kg/m    General: NAD, pleasant, able to participate in exam Respiratory: Normal WOB on RA Extremities: No edema or cyanosis. Skin: Warm and dry, no rashes noted Neuro: Alert, no obvious focal deficits Psych: Normal affect and mood  ASSESSMENT/PLAN:   Birth control counseling Discussed birth control options, having irregular bleeding with Nexplanon and due to be removed in 10/2022. Possible interested in IUD, provided resources. Due for PAP and will obtain at follow up.   Due for COVID and Varicella for pre-employment vaccinations. Needs to obtain flu shot at pharmacy. Quant TB drawn today.  Dr. Elberta Fortis, DO [facility]

## 2022-08-10 NOTE — Patient Instructions (Signed)
It was wonderful to see you today! Thank you for choosing [facility].   Please bring ALL of your medications with you to every visit.   Today we talked about:  We gave you the COVID shot and Varicella shot today. To get the flu vaccine you can just walk into your pharmacy and have it done. We are also doing the tuberculosis blood test today and I will follow up with you about the results  Please follow up when available to have PAP and discuss other birth control options.  We are checking some labs today. If they are abnormal, I will call you. If they are normal, I will send you a MyChart message (if it is active) or a letter in the mail. If you do not hear about your labs in the next 2 weeks, please call the office.  Call the clinic at [phone] if your symptoms worsen or you have any concerns.  Please be sure to schedule follow up at the front desk before you leave today.   Elberta Fortis, DO Family Medicine

## 2022-08-10 NOTE — Addendum Note (Signed)
Addended by: Jennette Bill on: 08/10/2022 11:27 AM   Modules accepted: Orders

## 2022-08-10 NOTE — Assessment & Plan Note (Addendum)
Discussed birth control options, having irregular bleeding with Nexplanon and due to be removed in 10/2022. Possible interested in IUD, provided resources. Due for PAP and will obtain at follow up.

## 2022-08-13 LAB — QUANTIFERON-TB GOLD PLUS
QuantiFERON Mitogen Value: >10 IU/mL
QuantiFERON Nil Value: 0 IU/mL
QuantiFERON TB1 Ag Value: 0.02 IU/mL
QuantiFERON TB2 Ag Value: 0.03 IU/mL
QuantiFERON-TB Gold Plus: NEGATIVE

## 2023-01-04 ENCOUNTER — Encounter: Payer: Self-pay | Admitting: Student

## 2023-01-04 ENCOUNTER — Ambulatory Visit: Payer: Medicaid Other | Admitting: Student

## 2023-01-04 VITALS — BP 137/98 | HR 75 | Ht 64.0 in | Wt 198.0 lb

## 2023-01-04 DIAGNOSIS — Z23 Encounter for immunization: Secondary | ICD-10-CM | POA: Diagnosis not present

## 2023-01-04 DIAGNOSIS — Z30011 Encounter for initial prescription of contraceptive pills: Secondary | ICD-10-CM

## 2023-01-04 MED ORDER — NORGESTIMATE-ETH ESTRADIOL 0.25-35 MG-MCG PO TABS: 1.0000 | ORAL_TABLET | Freq: Every day | ORAL | 11 refills | Status: DC

## 2023-01-04 NOTE — Progress Notes (Signed)
    SUBJECTIVE:   CHIEF COMPLAINT / HPI:    Cassandra [name] is a 32 y.o. [id number] here for Nexplanon removal. No other gynecologic concerns.  Endorses good tolerance to her Nexplanon but expressed preference for an OCP. Currently on her period now.  PERTINENT  PMH / PSH: Reviewed   OBJECTIVE:   BP (!) 137/98   Pulse 75   Ht 5\' 4"  (1.626 m)   Wt 198 lb (89.8 kg)   SpO2 97%   BMI 33.99 kg/m    Physical Exam General: Alert, well appearing, NAD Cardiovascular: Regular rate and rhythm, well-perfused Respiratory: Normal work of breathing on RA Skin: Warm and dry  ASSESSMENT/PLAN:   Nexplanon Removal Patient identified, informed consent performed, consent signed.   Appropriate time out taken. Nexplanon site identified.  Area prepped in usual sterile fashon. One ml of 1% lidocaine was used to anesthetize the area at the distal end of the implant. A small stab incision was made right beside the implant on the distal portion.  The Nexplanon rod was grasped using hemostats and removed without difficulty.  There was minimal blood loss. There were no complications.  3 ml of 1% lidocaine was injected around the incision for post-procedure analgesia.  Steri-strips were applied over the small incision.  A pressure bandage was applied to reduce any bruising.  The patient tolerated the procedure well and was given post procedure instructions.  Patient is planning to use OCP for contraception.  The patient is currently on her.  No pregnancy test was obtained today.  Sent in prescription for Sprintec.  Healthcare maintenance Patient received her flu vaccine today.  Risk and benefit discussed.   Jerre Simon, MD 01/04/2023, 5:02 PM PGY-3, [facility]

## 2023-01-04 NOTE — Patient Instructions (Addendum)
It was wonderful to see you today. Thank you for allowing me to be a part of your care. Below is a short summary of what we discussed at your visit today:  Today we took out your Nexplanon.  And presenting in prescription for Sprintec's which is an oral birth control.   Today would not do any pregnancy test.  If you have any questions or concerns, please do not hesitate to contact us via phone or MyChart message.   Jerre Simon, MD [facility]

## 2024-04-05 ENCOUNTER — Other Ambulatory Visit: Payer: Self-pay

## 2024-04-05 ENCOUNTER — Encounter (HOSPITAL_COMMUNITY): Payer: Self-pay | Admitting: *Deleted

## 2024-04-05 ENCOUNTER — Inpatient Hospital Stay (HOSPITAL_COMMUNITY)

## 2024-04-05 ENCOUNTER — Inpatient Hospital Stay (HOSPITAL_COMMUNITY)
Admission: AD | Admit: 2024-04-05 | Discharge: 2024-04-05 | Disposition: A | Discharge status: Home / Self Care | Source: Home / Self Care | Attending: Obstetrics and Gynecology | Admitting: Obstetrics and Gynecology

## 2024-04-05 DIAGNOSIS — Z3A01 Less than 8 weeks gestation of pregnancy: Secondary | ICD-10-CM

## 2024-04-05 DIAGNOSIS — O209 Hemorrhage in early pregnancy, unspecified: Secondary | ICD-10-CM | POA: Diagnosis not present

## 2024-04-05 DIAGNOSIS — B9689 Other specified bacterial agents as the cause of diseases classified elsewhere: Secondary | ICD-10-CM | POA: Insufficient documentation

## 2024-04-05 DIAGNOSIS — R109 Unspecified abdominal pain: Secondary | ICD-10-CM

## 2024-04-05 DIAGNOSIS — O26891 Other specified pregnancy related conditions, first trimester: Secondary | ICD-10-CM | POA: Diagnosis not present

## 2024-04-05 DIAGNOSIS — O23591 Infection of other part of genital tract in pregnancy, first trimester: Secondary | ICD-10-CM | POA: Insufficient documentation

## 2024-04-05 DIAGNOSIS — O26899 Other specified pregnancy related conditions, unspecified trimester: Secondary | ICD-10-CM

## 2024-04-05 DIAGNOSIS — Z3491 Encounter for supervision of normal pregnancy, unspecified, first trimester: Secondary | ICD-10-CM

## 2024-04-05 LAB — CBC
HCT: 36.4 % (ref 36.0–46.0)
Hemoglobin: 12.8 g/dL (ref 12.0–15.0)
MCH: 34 pg (ref 26.0–34.0)
MCHC: 35.2 g/dL (ref 30.0–36.0)
MCV: 96.6 fL (ref 80.0–100.0)
Platelets: 319 K/uL (ref 150–400)
RBC: 3.77 MIL/uL — ABNORMAL LOW (ref 3.87–5.11)
RDW: 13.1 % (ref 11.5–15.5)
WBC: 3.6 K/uL — ABNORMAL LOW (ref 4.0–10.5)
nRBC: 0 % (ref 0.0–0.2)

## 2024-04-05 LAB — URINALYSIS, ROUTINE W REFLEX MICROSCOPIC
Bilirubin Urine: NEGATIVE
Glucose, UA: NEGATIVE mg/dL
Ketones, ur: NEGATIVE mg/dL
Nitrite: NEGATIVE
Protein, ur: 30 mg/dL — AB
Specific Gravity, Urine: 1.01 (ref 1.005–1.030)
pH: 7 (ref 5.0–8.0)

## 2024-04-05 LAB — URINALYSIS, MICROSCOPIC (REFLEX): RBC / HPF: >50 RBC/hpf (ref 0–5)

## 2024-04-05 LAB — ABO/RH: ABO/RH(D): A POS

## 2024-04-05 LAB — WET PREP, GENITAL
Sperm: NONE SEEN
Trich, Wet Prep: NONE SEEN
WBC, Wet Prep HPF POC: <10 (ref <10)
Yeast Wet Prep HPF POC: NONE SEEN

## 2024-04-05 LAB — HCG, QUANTITATIVE, PREGNANCY: hCG, Beta Chain, Quant, S: 1789 m[IU]/mL — ABNORMAL HIGH (ref <5)

## 2024-04-05 LAB — POCT PREGNANCY, URINE: Preg Test, Ur: POSITIVE — AB

## 2024-04-05 MED ORDER — PRENATAL 28-0.8 MG PO TABS: 1.0000 | ORAL_TABLET | Freq: Every day | ORAL | 12 refills | Status: AC

## 2024-04-05 MED ORDER — ONDANSETRON HCL 4 MG PO TABS: 8.0000 mg | ORAL_TABLET | Freq: Two times a day (BID) | ORAL | 0 refills | Status: AC

## 2024-04-05 MED ORDER — METRONIDAZOLE 500 MG PO TABS: 500.0000 mg | ORAL_TABLET | Freq: Two times a day (BID) | ORAL | 0 refills | Status: AC

## 2024-04-05 NOTE — Discharge Instructions (Signed)

## 2024-04-05 NOTE — MAU Provider Note (Signed)
 S/HPI Ms. Cassandra [name] is a 33 y.o. [phone] patient who presents to MAU today with complaint of vaginal bleeding and cramping that began yesterday.  Patient states that she had a home positive pregnancy test with an LMP of 02/20/2024.  She reports I think I am miscarrying's she offers no vaginal complaints other than moderate bleeding  Denies any vaginal burning, irritation, and offers no urinary signs or symptoms.  She also denies any recent exams or sexual activity  The remainder of the ROS is negative on last noted in HPI above  O BP 128/78 (BP Location: Right Arm)   Pulse 86   Temp 98.9 F (37.2 C) (Oral)   Resp 19   Ht 5' 4 (1.626 m)   Wt 78.2 kg   LMP 02/20/2024   SpO2 100%   BMI 29.59 kg/m  Physical Exam Vitals and nursing note reviewed.  Constitutional:      General: She is not in acute distress.    Appearance: Normal appearance. She is not ill-appearing.  HENT:     Head: Normocephalic.  Cardiovascular:     Rate and Rhythm: Normal rate.  Pulmonary:     Effort: Pulmonary effort is normal.  Abdominal:     Palpations: Abdomen is soft.     Tenderness: There is no abdominal tenderness.  Musculoskeletal:        General: Normal range of motion.     Cervical back: Normal range of motion.  Skin:    General: Skin is warm.  Neurological:     Mental Status: She is alert and oriented to person, place, and time.  Psychiatric:        Mood and Affect: Mood normal.        Behavior: Behavior normal.     MDM  HIGH  Vaginal bleeding in early pregnacy CBC: NM HCG Quant 1,789 ABO: A POS OB Ultrasound (single gestational sac with yolk sac present, and no fetal pole or cardiac activity at this time no evidence of subchorionic hemorrhage) follow-up scan for fetal viability is recommended in 14 days for progression of pregnancy Vaginal Swabs - Wet prep with clue cells ( Will plan for Rx at discharge) UA: Lg Blood , small leukocytes, 30 of proteinuria (likely  contaminated will send for culture)   Differential diagnosis considered for 1st trimester vaginal bleeding includes but is not limited to: ectopic pregnancy, complete spontaneous abortion, incomplete abortion, missed abortion, threatened abortion, embryonic/fetal demise, cervical insufficiency, cervical or vaginal disorder    Orders Placed This Encounter  Procedures   Wet prep, genital    Standing Status:   Standing    Number of Occurrences:   1   Culture, OB Urine    Standing Status:   Standing    Number of Occurrences:   1   US  OB Comp Less 14 Wks    Standing Status:   Standing    Number of Occurrences:   1    Symptom/Reason for Exam:   Vaginal bleeding [[id number]]   Urinalysis, Routine w reflex microscopic -Urine, Clean Catch    Standing Status:   Standing    Number of Occurrences:   1    Specimen Source:   Urine, Clean Catch [76]   CBC    Standing Status:   Standing    Number of Occurrences:   1   hCG, quantitative, pregnancy    Standing Status:   Standing    Number of Occurrences:   1   Urinalysis,  Microscopic (reflex)    Standing Status:   Standing    Number of Occurrences:   1   Pregnancy, urine POC    Standing Status:   Standing    Number of Occurrences:   1   ABO/Rh    Standing Status:   Standing    Number of Occurrences:   1   Discharge patient Discharge disposition: 01-Home or Self Care; Discharge patient date: 04/05/2024    Standing Status:   Standing    Number of Occurrences:   1    Discharge disposition:   01-Home or Self Care [1]    Discharge patient date:   04/05/2024      Results for orders placed or performed during the hospital encounter of 04/05/24 (from the past 24 hours)  Pregnancy, urine POC     Status: Abnormal   Collection Time: 04/05/24  9:49 AM  Result Value Ref Range   Preg Test, Ur POSITIVE (A) NEGATIVE  Urinalysis, Routine w reflex microscopic -Urine, Clean Catch     Status: Abnormal   Collection Time: 04/05/24 10:04 AM  Result Value Ref  Range   Color, Urine RED (A) YELLOW   APPearance CLOUDY (A) CLEAR   Specific Gravity, Urine 1.010 1.005 - 1.030   pH 7.0 5.0 - 8.0   Glucose, UA NEGATIVE NEGATIVE mg/dL   Hgb urine dipstick LARGE (A) NEGATIVE   Bilirubin Urine NEGATIVE NEGATIVE   Ketones, ur NEGATIVE NEGATIVE mg/dL   Protein, ur 30 (A) NEGATIVE mg/dL   Nitrite NEGATIVE NEGATIVE   Leukocytes,Ua SMALL (A) NEGATIVE  Wet prep, genital     Status: Abnormal   Collection Time: 04/05/24 10:04 AM   Specimen: Urine, Clean Catch  Result Value Ref Range   Yeast Wet Prep HPF POC NONE SEEN NONE SEEN   Trich, Wet Prep NONE SEEN NONE SEEN   Clue Cells Wet Prep HPF POC PRESENT (A) NONE SEEN   WBC, Wet Prep HPF POC <10 <10   Sperm NONE SEEN   Urinalysis, Microscopic (reflex)     Status: Abnormal   Collection Time: 04/05/24 10:04 AM  Result Value Ref Range   RBC / HPF >50 0 - 5 RBC/hpf   WBC, UA 11-20 0 - 5 WBC/hpf   Bacteria, UA RARE (A) NONE SEEN   Squamous Epithelial / HPF 0-5 0 - 5 /HPF  CBC     Status: Abnormal   Collection Time: 04/05/24 10:30 AM  Result Value Ref Range   WBC 3.6 (L) 4.0 - 10.5 K/uL   RBC 3.77 (L) 3.87 - 5.11 MIL/uL   Hemoglobin 12.8 12.0 - 15.0 g/dL   HCT 63.5 63.9 - 53.9 %   MCV 96.6 80.0 - 100.0 fL   MCH 34.0 26.0 - 34.0 pg   MCHC 35.2 30.0 - 36.0 g/dL   RDW 86.8 88.4 - 84.4 %   Platelets 319 150 - 400 K/uL   nRBC 0.0 0.0 - 0.2 %  ABO/Rh     Status: None   Collection Time: 04/05/24 10:30 AM  Result Value Ref Range   ABO/RH(D) A POS    No rh immune globuloin      NOT A RH IMMUNE GLOBULIN CANDIDATE, PT RH POSITIVE Performed at [facility], [address], [place], [place] [address]   hCG, quantitative, pregnancy     Status: Abnormal   Collection Time: 04/05/24 10:30 AM  Result Value Ref Range   hCG, Beta Chain, Quant, S 1,789 (H) <5 mIU/mL  Study Result  Narrative & Impression  CLINICAL DATA:  [id number] Vaginal bleeding 890711   EXAM: OBSTETRIC <14 WK ULTRASOUND    TECHNIQUE: Transabdominal ultrasound was performed for evaluation of the gestation as well as the maternal uterus and adnexal regions.   COMPARISON:  05/08/2019   FINDINGS: Intrauterine gestational sac: Single   Yolk sac:  Present   Fetal Pole:  Not present, at this time.   Cardiac Activity: Not present, at this time.   MSD: 6.6 mm 5w 3d   Subchorionic hemorrhage:  None visualized.   Maternal uterus/adnexae: The right ovary was not visualized. Normal appearance of the left ovary. No free pelvic fluid.   IMPRESSION: Single intrauterine gestation with a yolk sac present and estimated gestational age of [redacted] weeks and 3 days. No fetal pole or fetal heartbeat visualized at this time, likely due to the earliness of the gestation. Continued close clinical follow-up with a repeat first trimester pregnancy ultrasound in 14 days is recommended to assess for progression of the pregnancy.     Electronically Signed   By: Rogelia Myers M.D.   On: 04/05/2024 11:36    I have reviewed the patient chart and performed the physical exam . I have ordered & interpreted the lab results and reviewed and interpreted the OB ultrasound images dependently and agree with the radiologist findings Medications ordered as stated below.  A/P as described below.  Counseling and education provided and patient agreeable  with plan as described below. Verbalized understanding.    ASSESSMENT Medical screening exam complete Normal intrauterine pregnancy on prenatal ultrasound in first trimester  Vaginal bleeding affecting early pregnancy  Abdominal cramping affecting pregnancy  Bacterial vaginosis      PLAN  Establish Prenatal Care  Follow up ultrasound recommended in 14 days for viability of early gestation  Discharge from MAU in stable condition  See AVS for full description of educational information and instructions provided to the patient at time of discharge  List of options for  follow-up given   Warning signs for worsening condition that would warrant emergency follow-up discussed  Patient may return to MAU as needed   Allergies as of 04/05/2024   No Known Allergies      Medication List     STOP taking these medications    norgestimate -ethinyl estradiol  0.25-35 MG-MCG tablet Commonly known as: Sprintec 28       TAKE these medications    metroNIDAZOLE  500 MG tablet Commonly known as: FLAGYL  Take 1 tablet (500 mg total) by mouth 2 (two) times daily.   ondansetron  4 MG tablet Commonly known as: Zofran  Take 2 tablets (8 mg total) by mouth 2 (two) times daily.   Prenatal 28-0.8 MG Tabs Take 1 tablet by mouth daily.         Littie Olam LABOR, NP 04/05/2024 12:09 PM   This chart was dictated using voice recognition software, Dragon. Despite the best efforts of this provider to proofread and correct errors, errors may still occur which can change documentation meaning.

## 2024-04-05 NOTE — MAU Note (Signed)
 Cassandra [name] is a 33 y.o. at Unknown here in MAU reporting: she thinks she might be miscarrying.  States she began having moderate VB and cramping that began yesterday.  Denies recent intercourse.  Endorses +HPT.  LMP: 02/20/2024 Onset of complaint: yesterday Pain score: 3 Vitals:   04/05/24 0959  BP: 128/78  Pulse: 86  Resp: 19  Temp: 98.9 F (37.2 C)  SpO2: 100%     FHT: NA  Lab orders placed from triage: UPT

## 2024-04-06 LAB — GC/CHLAMYDIA PROBE AMP (~~LOC~~) NOT AT ARMC
Chlamydia: NEGATIVE
Comment: NEGATIVE
Comment: NORMAL
Neisseria Gonorrhea: NEGATIVE

## 2024-04-07 LAB — CULTURE, OB URINE: Culture: NO GROWTH

## 2024-04-27 ENCOUNTER — Ambulatory Visit: Payer: Self-pay | Admitting: Family Medicine

## 2024-04-27 ENCOUNTER — Other Ambulatory Visit (HOSPITAL_COMMUNITY)
Admission: RE | Admit: 2024-04-27 | Discharge: 2024-04-27 | Disposition: A | Source: Ambulatory Visit | Attending: Family Medicine | Admitting: Family Medicine

## 2024-04-27 ENCOUNTER — Ambulatory Visit

## 2024-04-27 VITALS — BP 131/85 | HR 86

## 2024-04-27 DIAGNOSIS — Z113 Encounter for screening for infections with a predominantly sexual mode of transmission: Secondary | ICD-10-CM | POA: Insufficient documentation

## 2024-04-27 DIAGNOSIS — O039 Complete or unspecified spontaneous abortion without complication: Secondary | ICD-10-CM

## 2024-04-27 NOTE — Patient Instructions (Signed)
" ° °  It was great to see you!  Our plans for today:  - We are checking some labs today, we will release these results to your MyChart. - follow up in 1 month to discuss birth control regimen   Take care and seek immediate care sooner if you develop any concerns.       Houston Coralee HAS PGY 1 Family Medicine Resident [facility]  [address] [place], [place] [address] Fax [phone] Phone [phone] 04/27/2024, 4:46 PM  "

## 2024-04-27 NOTE — Progress Notes (Unsigned)
" ° ° °  SUBJECTIVE:   CHIEF COMPLAINT / HPI:   Cassandra [name] 34 y.o. female present at [facility] clinic today with the following concern   STIs/Recent pregnancy lose   Pt w/ hx of BV and recent pregnancy lose last month states since then she has been doing well. Denies spotting, vaginal bleeding, or abdominal pain. Pt would like to also proceed on annually Pap today  PERTINENT  PMH / PSH:  None contributing   OBJECTIVE:   BP 131/85 (BP Location: Right Arm, Patient Position: Sitting)   Pulse 86   LMP 02/20/2024   Physical Exam Exam conducted with a chaperone present.  Cardiovascular:     Rate and Rhythm: Normal rate.     Pulses: Normal pulses.  Pulmonary:     Effort: Pulmonary effort is normal.  Abdominal:     Palpations: Abdomen is soft.  Genitourinary:    General: Normal vulva.     Labia:        Left: Lesion present.      Comments: Left labia at 9 o'clock  Neurological:     Mental Status: She is alert.      ASSESSMENT/PLAN:   Assessment & Plan Screening examination for STI One partner, low risk STIs.  - Pap w/ wet pap today result pending Miscarriage - Place f/u intravaginal US  - Discuss birth control plan F/u in 1 month for discussing birth control options  Houston Samuels, DO  PGY1 Family Medicine Resident "

## 2024-05-02 LAB — CYTOLOGY - PAP
Chlamydia: NEGATIVE
Comment: NEGATIVE
Comment: NEGATIVE
Comment: NEGATIVE
Comment: NORMAL
Diagnosis: NEGATIVE
Diagnosis: REACTIVE
High risk HPV: NEGATIVE
Neisseria Gonorrhea: NEGATIVE
Trichomonas: NEGATIVE

## 2024-05-07 ENCOUNTER — Ambulatory Visit: Payer: Self-pay
